# Patient Record
Sex: Female | Born: 1965 | Race: White | Hispanic: No | Marital: Married | Smoking: Former smoker
Health system: Southern US, Community
[De-identification: ages and names within clinical notes are randomized; demographics above are authoritative.]

## PROBLEM LIST (undated history)

## (undated) DIAGNOSIS — G4730 Sleep apnea, unspecified: Secondary | ICD-10-CM

## (undated) DIAGNOSIS — M069 Rheumatoid arthritis, unspecified: Secondary | ICD-10-CM

## (undated) DIAGNOSIS — E785 Hyperlipidemia, unspecified: Secondary | ICD-10-CM

## (undated) DIAGNOSIS — E039 Hypothyroidism, unspecified: Secondary | ICD-10-CM

## (undated) DIAGNOSIS — I10 Essential (primary) hypertension: Secondary | ICD-10-CM

## (undated) HISTORY — DX: Sleep apnea, unspecified: G47.30

## (undated) HISTORY — DX: Hyperlipidemia, unspecified: E78.5

## (undated) HISTORY — DX: Rheumatoid arthritis, unspecified: M06.9

## (undated) HISTORY — DX: Essential (primary) hypertension: I10

## (undated) HISTORY — DX: Hypothyroidism, unspecified: E03.9

## (undated) HISTORY — PX: TONSILLECTOMY: SHX5217

## (undated) HISTORY — PX: WISDOM TOOTH EXTRACTION: SHX21

---
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
## 1999-06-11 DIAGNOSIS — E039 Hypothyroidism, unspecified: Secondary | ICD-10-CM

## 1999-06-11 HISTORY — DX: Hypothyroidism, unspecified: E03.9

## 2006-04-10 DIAGNOSIS — M069 Rheumatoid arthritis, unspecified: Secondary | ICD-10-CM

## 2006-04-10 HISTORY — DX: Rheumatoid arthritis, unspecified: M06.9

## 2006-06-18 ENCOUNTER — Encounter: Admission: RE | Admit: 2006-06-18 | Discharge: 2006-06-18 | Payer: Self-pay | Admitting: Rheumatology

## 2007-03-18 ENCOUNTER — Emergency Department (HOSPITAL_COMMUNITY): Admission: EM | Admit: 2007-03-18 | Discharge: 2007-03-18 | Payer: Self-pay | Admitting: Family Medicine

## 2012-05-25 ENCOUNTER — Other Ambulatory Visit: Payer: Self-pay | Admitting: Family Medicine

## 2012-05-25 ENCOUNTER — Encounter: Payer: Self-pay | Admitting: Family Medicine

## 2012-05-25 ENCOUNTER — Ambulatory Visit (INDEPENDENT_AMBULATORY_CARE_PROVIDER_SITE_OTHER): Payer: BC Managed Care – PPO | Admitting: Family Medicine

## 2012-05-25 VITALS — BP 122/76 | HR 68 | Ht 68.0 in | Wt 248.0 lb

## 2012-05-25 DIAGNOSIS — M069 Rheumatoid arthritis, unspecified: Secondary | ICD-10-CM

## 2012-05-25 DIAGNOSIS — E039 Hypothyroidism, unspecified: Secondary | ICD-10-CM

## 2012-05-25 DIAGNOSIS — M0579 Rheumatoid arthritis with rheumatoid factor of multiple sites without organ or systems involvement: Secondary | ICD-10-CM | POA: Insufficient documentation

## 2012-05-25 DIAGNOSIS — E669 Obesity, unspecified: Secondary | ICD-10-CM | POA: Insufficient documentation

## 2012-05-25 DIAGNOSIS — Z79899 Other long term (current) drug therapy: Secondary | ICD-10-CM

## 2012-05-25 DIAGNOSIS — I10 Essential (primary) hypertension: Secondary | ICD-10-CM

## 2012-05-25 DIAGNOSIS — Z833 Family history of diabetes mellitus: Secondary | ICD-10-CM

## 2012-05-25 LAB — POCT GLYCOSYLATED HEMOGLOBIN (HGB A1C): Hemoglobin A1C: 5.4

## 2012-05-25 LAB — BASIC METABOLIC PANEL
CO2: 30 mEq/L (ref 19–32)
Creat: 0.85 mg/dL (ref 0.50–1.10)
Sodium: 142 mEq/L (ref 135–145)

## 2012-05-25 LAB — TSH: TSH: 2.735 u[IU]/mL (ref 0.350–4.500)

## 2012-05-25 MED ORDER — BENAZEPRIL HCL 10 MG PO TABS: 10.0000 mg | ORAL_TABLET | Freq: Every day | ORAL | Status: DC

## 2012-05-25 MED ORDER — HYDROCHLOROTHIAZIDE 25 MG PO TABS: 25.0000 mg | ORAL_TABLET | Freq: Every day | ORAL | Status: DC

## 2012-05-25 NOTE — Progress Notes (Signed)
Chief Complaint  Patient presents with  . Establish Care    new patient to establish care, needs 3 rx's. Would like to have a nutrition consult.    Patient presents to establish care. She is under the care of Dr. Corliss Skains for her RA, and has labs done q3 months with her (CBC, LFT's, Cr).  Hasn't had TSH done in over 2 years, no had sugar checked, or lipids.  Her chief concern is that she never feels like her energy is as good as it was in the past.  She used to be able to swim a mile 3x/week, and when able to do this, she expected to feel better, more energy.  Wants to feel like she did when she was younger, or when she returned from a trip to [place], where energy felt better. Periods every 3-4 months, very light.  +hot flashes.  Denies h/o anemia.  +weight gain, 20 pounds in the last 6 months.  Less active due to arthritis flares.  Doing well currently.  Walks 1 mile daily with dogs, hikes 3-5 miles on the weekends.   Just rejoined the pool.  Has a big back-packing trip in June.  She is interested in learning how to eat better, healthier, and would like nutrition consult.  HTN--BP's at home 120's/70's.  Denies headaches, dizziness, chest pain.  Occasionally has pretibial swelling (sock marks, mild).  Health maintenance: never had a mammogram or colonoscopy.  Doesn't recall last tetanus.   Immunization History  Administered Date(s) Administered  . Influenza Split 03/10/2012  . Pneumococcal Polysaccharide 07/02/2006  believes she has had normal lipids in past (recalls that ratio was okay)   Past Medical History  Diagnosis Date  . Rheumatoid arthritis 04/2006    Dr. Corliss Skains manages  . Hypothyroidism 2001  . Hypertension    Past Surgical History  Procedure Date  . Pilonidal cyst excision 1983  . Tonsillectomy age 40  . Wisdom tooth extraction    History   Social History  . Marital Status: Unknown    Spouse Name: N/A    Number of Children: N/A  . Years of Education: N/A    Occupational History  . teaches biology/ecology at [employer]    Social History Main Topics  . Smoking status: Former Smoker -- 1.5 packs/day for 26 years    Types: Cigarettes    Start date: 01/08/2005  . Smokeless tobacco: Never Used  . Alcohol Use: Yes     Comment: 1-2 drinks (beer) per week.  . Drug Use: No  . Sexually Active: Yes -- Female partner(s)   Other Topics Concern  . Not on file   Social History Narrative   Lives with female partner of 20 years. 3 cats, 2 dogs, 14 chickens.   Family History  Problem Relation Age of Onset  . Heart disease Mother 69    MI, stents x 2  . Stroke Mother 62  . Hyperlipidemia Mother   . Hypertension Mother   . Depression Mother   . Diabetes Mother   . Thyroid disease Mother     Graves disease, s/p RAI, now hypothyroid  . Irritable bowel syndrome Mother   . COPD Mother   . Colon polyps Mother   . Heart disease Father 39    MI; s/p CABG  . Stroke Father 71  . Hyperlipidemia Father   . Hypertension Father   . Depression Father   . Diabetes Father   . Thyroid disease Father  hypothyroidism  . COPD Father   . Cancer Sister 58    pancreatic cancer  . Bipolar disorder Sister   . Lupus Paternal Aunt    Current outpatient prescriptions:aspirin 81 MG tablet, Take 81 mg by mouth daily., Disp: , Rfl: ;  benazepril (LOTENSIN) 10 MG tablet, Take 1 tablet (10 mg total) by mouth daily., Disp: 90 tablet, Rfl: 1;  ENBREL SURECLICK 50 MG/ML injection, 50 mg once a week. , Disp: , Rfl: ;  FABB 2.2-25-1 MG TABS, 1 tablet daily. , Disp: , Rfl: ;  fish oil-omega-3 fatty acids 1000 MG capsule, Take 1 g by mouth daily., Disp: , Rfl:  hydrochlorothiazide (HYDRODIURIL) 25 MG tablet, Take 1 tablet (25 mg total) by mouth daily., Disp: 90 tablet, Rfl: 1;  methotrexate 25 MG/ML injection, Inject 25 mg/m2 into the vein once., Disp: , Rfl: ;  Nutritional Supplements (EQ ESTROBLEND MENOPAUSE PO), Take 1 tablet by mouth daily., Disp: , Rfl:  ;  SYNTHROID 137 MCG tablet, Take 137 mcg by mouth daily. , Disp: , Rfl:  traMADol (ULTRAM) 50 MG tablet, Take 50 mg by mouth as needed., Disp: , Rfl:   No Known Allergies  ROS: Hoarseness--has seen ENT and told related to RA.  X 2 years.  Resolves with steroids.  Denies fevers, headaches, dizziness, chest pain, shortness of breath, cough, URI symptoms, nausea, vomiting, heartburn, bowel changes, skin rash, bleeding/bruising, depression/anxiety. +irregular menses, light.  No current joint complaints/flares.  See HPI  PHYSICAL EXAM: BP 122/76  Pulse 68  Ht 5\' 8"  (1.727 m)  Wt 248 lb (112.492 kg)  BMI 37.71 kg/m2  LMP 02/21/2012 Well developed, pleasant female in no distress HEENT:  PERRL, EOMI, conjunctiva clear.  OP clear Neck: no lymphadenopathy, thyromegaly or carotid bruit Heart: regular rate and rhythm without murmur Lungs: clear bilaterally Back: no spine or CVA tenderness Abdomen: soft, nontender, no organomegaly or mass Extremities: no edema Skin: no rash Psych: normal mood, affect, hygiene and grooming Neuro: alert and oriented, cranial nerves intact. Normal strength, gait  ASSESSMENT/PLAN:  1. Unspecified hypothyroidism  TSH  2. Essential hypertension, benign  Basic metabolic panel, benazepril (LOTENSIN) 10 MG tablet, hydrochlorothiazide (HYDRODIURIL) 25 MG tablet  3. Encounter for long-term (current) use of other medications  Basic metabolic panel  4. Family history of diabetes mellitus  HgB A1c  5. Rheumatoid arthritis    6. Obesity (BMI 30-39.9)     HTN--well controlled.  Need to see last lipids, likely will need to be rechecked (but will make sure TSH normal first, since nonfasting today). Check b-met (hasn't had sodium/potassium checked in years).  Will need lipid and glucose when fasting, but will confirm that thyroid dose is correct first.  Check A1c today, given obesity, strong family h/o DM. Will review old records, when received, for last  lipids.  Hypothyroidism--discussed at length reason for brand vs generic (some cost concerns on her part), willing to continue brand for now.  Having some thyroid symptoms--await lab results before refilling. (if TSH normal, refill 90 day supply x 3 refills, if dose needs adjustment, just #30 x 1 refill and recheck labs).  Obesity--await lab results for nutrition referral.  HTN, family h/o heart disease and DM, obesity.  Health  Maintenance: encouraged to get yearly mammograms, return for CPE.  Will likely need TdaP at CPE, and pneumovax booster.  ADD VITAMIN D if TSH normal, for evaluation of fatigue, otherwise, can check when she gets lipids done, or with next labs.   45  minute visit, more than 1/2 spent counseling (re: meds, diet, HM recommendations, and why she should follow them)

## 2012-05-25 NOTE — Patient Instructions (Addendum)
Sign release to get previous records (immunizations).  We will be in contact with your test results within a few days. We will refer you to a nutritionist once labs back.  You likely will need lipids checked at some point, but I want to make sure to check them when your thyroid dose is known to be correct (and you need to be fasting, so not done today).  If your mother had adenomatous colon polyps, then colonoscopy is recommended now (not waiting until age 72). Yearly mammograms are recommended. TdaP (tetanus and pertussis) is recommended, unless you have already had one since 2005. Pneumonia vaccines are recommended every 5-7 years

## 2012-05-26 ENCOUNTER — Encounter: Payer: Self-pay | Admitting: Family Medicine

## 2012-05-26 LAB — VITAMIN D 25 HYDROXY (VIT D DEFICIENCY, FRACTURES): Vit D, 25-Hydroxy: 36 ng/mL (ref 30–89)

## 2012-05-26 MED ORDER — SYNTHROID 137 MCG PO TABS: 137.0000 ug | ORAL_TABLET | Freq: Every day | ORAL | Status: DC

## 2012-06-18 ENCOUNTER — Encounter: Payer: Self-pay | Admitting: *Deleted

## 2012-09-07 ENCOUNTER — Telehealth: Payer: Self-pay | Admitting: *Deleted

## 2012-09-07 ENCOUNTER — Other Ambulatory Visit: Payer: BC Managed Care – PPO

## 2012-09-07 DIAGNOSIS — Z13220 Encounter for screening for lipoid disorders: Secondary | ICD-10-CM

## 2012-09-07 DIAGNOSIS — Z833 Family history of diabetes mellitus: Secondary | ICD-10-CM

## 2012-09-07 LAB — LIPID PANEL
HDL: 48 mg/dL (ref >39)
Triglycerides: 106 mg/dL (ref <150)
VLDL: 21 mg/dL (ref 0–40)

## 2012-09-07 LAB — GLUCOSE, RANDOM: Glucose, Bld: 91 mg/dL (ref 70–99)

## 2012-09-07 NOTE — Telephone Encounter (Signed)
Patient came in for pre-CPE labs, need orders please. Thanks.

## 2012-09-07 NOTE — Telephone Encounter (Signed)
Just needs lipids and glucose (v77.91 for screen for lipid d/o--other records never received, so not known if she has had high cholesterol in past), and screen for diabetes code for glucose (and/or family h/o DM)

## 2012-09-08 ENCOUNTER — Encounter: Payer: Self-pay | Admitting: Internal Medicine

## 2012-09-09 ENCOUNTER — Ambulatory Visit (INDEPENDENT_AMBULATORY_CARE_PROVIDER_SITE_OTHER): Payer: BC Managed Care – PPO | Admitting: Family Medicine

## 2012-09-09 ENCOUNTER — Encounter: Payer: Self-pay | Admitting: Family Medicine

## 2012-09-09 VITALS — BP 120/84 | HR 72 | Ht 68.0 in | Wt 247.0 lb

## 2012-09-09 DIAGNOSIS — E785 Hyperlipidemia, unspecified: Secondary | ICD-10-CM

## 2012-09-09 DIAGNOSIS — Z23 Encounter for immunization: Secondary | ICD-10-CM

## 2012-09-09 DIAGNOSIS — E669 Obesity, unspecified: Secondary | ICD-10-CM

## 2012-09-09 DIAGNOSIS — I10 Essential (primary) hypertension: Secondary | ICD-10-CM

## 2012-09-09 DIAGNOSIS — E039 Hypothyroidism, unspecified: Secondary | ICD-10-CM

## 2012-09-09 DIAGNOSIS — M069 Rheumatoid arthritis, unspecified: Secondary | ICD-10-CM

## 2012-09-09 DIAGNOSIS — E782 Mixed hyperlipidemia: Secondary | ICD-10-CM | POA: Insufficient documentation

## 2012-09-09 DIAGNOSIS — Z0000 Encounter for general adult medical examination without abnormal findings: Secondary | ICD-10-CM

## 2012-09-09 LAB — POCT URINALYSIS DIPSTICK
Bilirubin, UA: NEGATIVE
Glucose, UA: NEGATIVE
Nitrite, UA: NEGATIVE
Urobilinogen, UA: NEGATIVE

## 2012-09-09 NOTE — Patient Instructions (Signed)
HEALTH MAINTENANCE RECOMMENDATIONS:  It is recommended that you get at least 30 minutes of aerobic exercise at least 5 days/week (for weight loss, you may need as much as 60-90 minutes). This can be any activity that gets your heart rate up. This can be divided in 10-15 minute intervals if needed, but try and build up your endurance at least once a week.  Weight bearing exercise is also recommended twice weekly.  Eat a healthy diet with lots of vegetables, fruits and fiber.  "Colorful" foods have a lot of vitamins (ie green vegetables, tomatoes, red peppers, etc).  Limit sweet tea, regular sodas and alcoholic beverages, all of which has a lot of calories and sugar.  Up to 1 alcoholic drink daily may be beneficial for women (unless trying to lose weight, watch sugars).  Drink a lot of water.  Calcium recommendations are 1200-1500 mg daily (1500 mg for postmenopausal women or women without ovaries), and vitamin D 1000 IU daily.  This should be obtained from diet and/or supplements (vitamins), and calcium should not be taken all at once, but in divided doses.  Monthly self breast exams and yearly mammograms for women over the age of 44 is recommended.  Sunscreen of at least SPF 30 should be used on all sun-exposed parts of the skin when outside between the hours of 10 am and 4 pm (not just when at beach or pool, but even with exercise, golf, tennis, and yard work!)  Use a sunscreen that says "broad spectrum" so it covers both UVA and UVB rays, and make sure to reapply every 1-2 hours.  Remember to change the batteries in your smoke detectors when changing your clock times in the spring and fall.  Use your seat belt every time you are in a car, and please drive safely and not be distracted with cell phones and texting while driving.  I recommend Dr. Carrington Clamp at [facility] for your routine GYN care.  Please call to schedule your breast/pelvic exam.  You will need a pap smear

## 2012-09-09 NOTE — Progress Notes (Signed)
Chief Complaint  Patient presents with  . Annual Exam    nonfasting annual exam no pap. UA showed trace leuks, no symptoms. Would like discuss HRT.   Jill Mathews is a 47 y.o. female who presents for a complete physical.  She has the following concerns:  Periods are irregular, every 4-5 months, very short.  Having mild hot flashes, OTC meds have been helpful. Asking about HRT, discussion of benefits, worried about osteoporosis.  HTN--BP's 120's/70's-80's at home.  +stress at work, teaching an extra class. Hypothyroidism--compliant with meds.  Denies hair/skin/weight changes  HEALTH MAINTENANCE: Immunization History  Administered Date(s) Administered  . Influenza Split 03/10/2012  . Pneumococcal Polysaccharide 07/02/2006  . Tdap 09/09/2012  last tetanus >10 years ago Last Pap smear:  Age 35--had a very bad experience and refuses--will only see a lesbian GYN; declines exam today Last mammogram: never Last colonoscopy: never Last DEXA:never Dentist: every 6 months Ophtho: yearly (due to meds) Exercise:  1 mile daily (with the dogs), walks 5-10 miles on the weekends.    Past Medical History  Diagnosis Date  . Rheumatoid arthritis 04/2006    Dr. Corliss Skains manages  . Hypothyroidism 2001  . Hypertension     Past Surgical History  Procedure Laterality Date  . Pilonidal cyst excision  1983  . Tonsillectomy  age 10  . Wisdom tooth extraction      History   Social History  . Marital Status: Unknown    Spouse Name: N/A    Number of Children: N/A  . Years of Education: N/A   Occupational History  . teaches biology/ecology at [employer]    Social History Main Topics  . Smoking status: Former Smoker -- 1.50 packs/day for 26 years    Types: Cigarettes    Start date: 01/08/2005  . Smokeless tobacco: Never Used  . Alcohol Use: Yes     Comment: 1-2 drinks (beer) per week.  . Drug Use: No  . Sexually Active: Yes -- Female partner(s)   Other Topics Concern   . Not on file   Social History Narrative   Lives with female partner of 20 years. 3 cats, 2 dogs, 14 chickens.    Family History  Problem Relation Age of Onset  . Heart disease Mother 50    MI, stents x 2  . Stroke Mother 33  . Hyperlipidemia Mother   . Hypertension Mother   . Depression Mother   . Diabetes Mother   . Thyroid disease Mother     Graves disease, s/p RAI, now hypothyroid  . Irritable bowel syndrome Mother   . COPD Mother   . Colon polyps Mother   . Cancer Mother     skin (not melanoma)  . Heart disease Father 64    MI; s/p CABG  . Stroke Father 62  . Hyperlipidemia Father   . Hypertension Father   . Depression Father   . Diabetes Father   . Thyroid disease Father     hypothyroidism  . COPD Father   . Cancer Sister 78    pancreatic cancer  . Bipolar disorder Sister   . Lupus Paternal Aunt     Current outpatient prescriptions:aspirin 81 MG tablet, Take 81 mg by mouth daily., Disp: , Rfl: ;  benazepril (LOTENSIN) 10 MG tablet, Take 1 tablet (10 mg total) by mouth daily., Disp: 90 tablet, Rfl: 1;  ENBREL SURECLICK 50 MG/ML injection, 50 mg once a week. , Disp: , Rfl: ;  FABB 2.2-25-1 MG  TABS, 1 tablet daily. , Disp: , Rfl: ;  fish oil-omega-3 fatty acids 1000 MG capsule, Take 1 g by mouth daily., Disp: , Rfl:  hydrochlorothiazide (HYDRODIURIL) 25 MG tablet, Take 1 tablet (25 mg total) by mouth daily., Disp: 90 tablet, Rfl: 1;  ibuprofen (ADVIL,MOTRIN) 200 MG tablet, Take 600 mg by mouth 2 (two) times daily., Disp: , Rfl: ;  methotrexate 25 MG/ML injection, Inject 25 mg/m2 into the vein once., Disp: , Rfl: ;  Nutritional Supplements (EQ ESTROBLEND MENOPAUSE PO), Take 1 tablet by mouth daily., Disp: , Rfl:  SYNTHROID 137 MCG tablet, Take 1 tablet (137 mcg total) by mouth daily., Disp: 90 tablet, Rfl: 3;  traMADol (ULTRAM) 50 MG tablet, Take 50 mg by mouth as needed., Disp: , Rfl:   No Known Allergies  ROS:  The patient denies anorexia, fever, weight changes,  headaches,  vision changes, decreased hearing, ear pain, sore throat, breast concerns, chest pain, palpitations, dizziness, syncope, dyspnea on exertion, cough, swelling, nausea, vomiting, diarrhea, constipation, abdominal pain, melena, hematochezia, indigestion/heartburn, hematuria, incontinence, dysuria,  vaginal discharge, odor or itch, genital lesions, numbness, tingling, weakness, tremor, suspicious skin lesions, depression, anxiety, abnormal bleeding/bruising, or enlarged lymph nodes.  +irregular menstrual cycles. Mild hot flashes Pain at wrists, elbows, shoulders, knees Hoarseness--chronic, has seen ENT, inflammation of vocal cords, felt to be related to her RA   PHYSICAL EXAM: BP 120/84  Pulse 72  Ht 5\' 8"  (1.727 m)  Wt 247 lb (112.038 kg)  BMI 37.56 kg/m2  LMP 07/22/2012 120/84 on repeat by MD General Appearance:    Alert, cooperative, no distress, appears stated age  Head:    Normocephalic, without obvious abnormality, atraumatic  Eyes:    PERRL, conjunctiva/corneas clear, EOM's intact, fundi    benign  Ears:    Normal TM's and external ear canals  Nose:   Nares normal, mucosa normal, no drainage or sinus   tenderness  Throat:   Lips, mucosa, and tongue normal; teeth and gums normal  Neck:   Supple, no lymphadenopathy;  thyroid:  no   enlargement/tenderness/nodules; no carotid   bruit or JVD  Back:    Spine nontender, no curvature, ROM normal, no CVA     tenderness  Lungs:     Clear to auscultation bilaterally without wheezes, rales or     ronchi; respirations unlabored  Chest Wall:    No tenderness or deformity   Heart:    Regular rate and rhythm, S1 and S2 normal, no murmur, rub   or gallop  Breast Exam:    Deferred to GYN  Abdomen:     Soft, non-tender, nondistended, normoactive bowel sounds,    no masses, no hepatosplenomegaly  Genitalia:    Deferred to GYN     Extremities:   No clubbing, cyanosis or edema  Pulses:   2+ and symmetric all extremities  Skin:   Skin  color, texture, turgor normal, no rashes or lesions  Lymph nodes:   Cervical, supraclavicular, and axillary nodes normal  Neurologic:   CNII-XII intact, normal strength, sensation and gait; reflexes 2+ and symmetric throughout          Psych:   Normal mood, affect, hygiene and grooming.    ASSESSMENT/PLAN: Routine general medical examination at a health care facility - Plan: Visual acuity screening, POCT Urinalysis Dipstick  Essential hypertension, benign - controlled - Plan: Lipid panel, Comprehensive metabolic panel  Obesity (BMI 30-39.9)  Need for Tdap vaccination - Plan: Tdap vaccine greater than or equal  to 7yo IM  Other and unspecified hyperlipidemia - mild.  refer to nutritionist  Rheumatoid arthritis - per Dr. Corliss Skains  Unspecified hypothyroidism - adequately replaced per last labs. euthyroid by history - Plan: TSH   Refer to [facility] (hyperlipidemia, HTN and weight loss)--if not covered, then refer to [facility]  Reviewed low cholesterol diet--cut back on egg yolks and bacon.  Discussed GYN recommendations and for her to schedule--reasons for need for pelvic exam (low risk for cervical cancer, but needs pelvic, and should have pap too) and breast exam.  Perimenopausal--continue with OTC herbal treatments.  Given that she isn't postmenopausal yet, isn't candidate for HRT.  Discussed symptomatic treatment with hormones once postmenopausal, but would need yearly mammograms.  May continue to use herbal supplements for now.  Advised to schedule yearly mammograms  Colonoscopy at 50, sooner if blood in stool.  Hemoccult given  Discussed monthly self breast exams and yearly mammograms after the age of 5; at least 30 minutes of aerobic activity at least 5 days/week; proper sunscreen use reviewed; healthy diet, including goals of calcium and vitamin D intake and alcohol recommendations (less than or equal to 1 drink/day) reviewed; regular seatbelt use; changing batteries in smoke  detectors.  Immunization recommendations discussed--Tdap today.  Colonoscopy recommendations reviewed--age 47, sooner if +hemoccult   Will refill meds when pharm calls in 3 months

## 2012-12-14 ENCOUNTER — Other Ambulatory Visit: Payer: Self-pay | Admitting: Family Medicine

## 2013-03-08 ENCOUNTER — Other Ambulatory Visit: Payer: BC Managed Care – PPO

## 2013-03-15 ENCOUNTER — Encounter: Payer: BC Managed Care – PPO | Admitting: Family Medicine

## 2013-04-15 ENCOUNTER — Other Ambulatory Visit: Payer: Self-pay

## 2013-04-20 ENCOUNTER — Other Ambulatory Visit: Payer: Self-pay | Admitting: Family Medicine

## 2013-05-15 ENCOUNTER — Encounter (HOSPITAL_COMMUNITY): Payer: Self-pay | Admitting: Emergency Medicine

## 2013-05-15 ENCOUNTER — Emergency Department (HOSPITAL_COMMUNITY)
Admission: EM | Admit: 2013-05-15 | Discharge: 2013-05-15 | Disposition: A | Discharge status: Home / Self Care | Payer: BC Managed Care – PPO | Attending: Emergency Medicine | Admitting: Emergency Medicine

## 2013-05-15 DIAGNOSIS — Z203 Contact with and (suspected) exposure to rabies: Secondary | ICD-10-CM | POA: Insufficient documentation

## 2013-05-15 DIAGNOSIS — I10 Essential (primary) hypertension: Secondary | ICD-10-CM | POA: Insufficient documentation

## 2013-05-15 DIAGNOSIS — Z862 Personal history of diseases of the blood and blood-forming organs and certain disorders involving the immune mechanism: Secondary | ICD-10-CM | POA: Insufficient documentation

## 2013-05-15 DIAGNOSIS — Z7982 Long term (current) use of aspirin: Secondary | ICD-10-CM | POA: Insufficient documentation

## 2013-05-15 DIAGNOSIS — M069 Rheumatoid arthritis, unspecified: Secondary | ICD-10-CM | POA: Insufficient documentation

## 2013-05-15 DIAGNOSIS — Z87891 Personal history of nicotine dependence: Secondary | ICD-10-CM | POA: Insufficient documentation

## 2013-05-15 DIAGNOSIS — Z8639 Personal history of other endocrine, nutritional and metabolic disease: Secondary | ICD-10-CM | POA: Insufficient documentation

## 2013-05-15 DIAGNOSIS — Z79899 Other long term (current) drug therapy: Secondary | ICD-10-CM | POA: Insufficient documentation

## 2013-05-15 NOTE — ED Notes (Signed)
Patient complaint of exposure to rabies.  Patient states that their dogs killed a fox that tested positive for rabies.  Patient states she put on latex gloves and put the fox in a bag and tied the bag.  Advised by the Health Department to be evaluated.  Exposure was on 05/06/2013.

## 2013-05-15 NOTE — ED Provider Notes (Signed)
CSN: [id number]     Arrival date & time 05/15/13  4098 History  This chart was scribed for Antony Madura, PA-C working with Doug Sou, MD by Nicholos Johns, ED scribe. This patient was seen in room TR07C/TR07C and the patient's care was started at 10:02.   Chief Complaint  Patient presents with  . Rabies Injection   The history is provided by the patient. No language interpreter was used.   HPI Comments: Jill Mathews is a 47 y.o. female who presents to the Emergency Department for possible rabies exporsure after dogs that came in contact with, and killed, a fox that tested positive for rabies. Incident occurred on 05/06/13. Pt stated she also handled the fox with rubber gloves but only touched "the tip of the tail." Pt states one of the dogs is up to date on its rabies shots and the other dog that was not up to date so it was euthanized. Denies blood to blood contact or open wounds. Denies being bitten by either the fox or one of her dogs. Dogs with normal temperament. Denies fever, headache, hallucinations, nausea, vomiting, body aches, or increased thirst.  Past Medical History  Diagnosis Date  . Rheumatoid arthritis(714.0) 04/2006    Dr. Corliss Skains manages  . Hypothyroidism 2001  . Hypertension    Past Surgical History  Procedure Laterality Date  . Pilonidal cyst excision  1983  . Tonsillectomy  age 68  . Wisdom tooth extraction     Family History  Problem Relation Age of Onset  . Heart disease Mother 43    MI, stents x 2  . Stroke Mother 55  . Hyperlipidemia Mother   . Hypertension Mother   . Depression Mother   . Diabetes Mother   . Thyroid disease Mother     Graves disease, s/p RAI, now hypothyroid  . Irritable bowel syndrome Mother   . COPD Mother   . Colon polyps Mother   . Cancer Mother     skin (not melanoma)  . Heart disease Father 23    MI; s/p CABG  . Stroke Father 42  . Hyperlipidemia Father   . Hypertension Father   . Depression Father   . Diabetes  Father   . Thyroid disease Father     hypothyroidism  . COPD Father   . Cancer Sister 58    pancreatic cancer  . Bipolar disorder Sister   . Lupus Paternal Aunt    History  Substance Use Topics  . Smoking status: Former Smoker -- 1.50 packs/day for 26 years    Types: Cigarettes    Start date: 01/08/2005  . Smokeless tobacco: Never Used  . Alcohol Use: Yes     Comment: 1-2 drinks (beer) per week.   OB History   Grav Para Term Preterm Abortions TAB SAB Ect Mult Living                 Review of Systems  Constitutional: Negative for fever and fatigue.  Respiratory: Negative for shortness of breath.   Gastrointestinal: Negative for nausea and vomiting.  Skin:       No bites or scratches  Neurological: Negative for weakness and headaches.  Psychiatric/Behavioral: Negative for hallucinations.    Allergies  Review of patient's allergies indicates no known allergies.  Home Medications   Current Outpatient Rx  Name  Route  Sig  Dispense  Refill  . aspirin 81 MG tablet   Oral   Take 81 mg by mouth daily.         Marland Kitchen  benazepril (LOTENSIN) 10 MG tablet   Oral   Take 10 mg by mouth daily.         Marland Kitchen BLACK COHOSH PO   Oral   Take 2 tablets by mouth daily.         Elgie Collard SURECLICK 50 MG/ML injection   Injection   Inject 50 mg as directed once a week.          Marland Kitchen FABB 2.2-25-1 MG TABS   Oral   Take 1 tablet by mouth daily.          . fish oil-omega-3 fatty acids 1000 MG capsule   Oral   Take 1 g by mouth daily.         . hydrochlorothiazide (HYDRODIURIL) 25 MG tablet   Oral   Take 25 mg by mouth daily.         . methotrexate 25 MG/ML injection   Intravenous   Inject 25 mg/m2 into the vein once.         . Nutritional Supplements (EQ ESTROBLEND MENOPAUSE PO)   Oral   Take 1 tablet by mouth daily.         Marland Kitchen SYNTHROID 137 MCG tablet   Oral   Take 1 tablet (137 mcg total) by mouth daily.   90 tablet   3     Dispense as written.    Triage  Vitals: BP 132/73  Temp(Src) 97 F (36.1 C) (Oral)  Resp 20  Ht 5\' 7"  (1.702 m)  Wt 245 lb (111.131 kg)  BMI 38.36 kg/m2  SpO2 98%  LMP 01/13/2013  Physical Exam  Nursing note and vitals reviewed. Constitutional: She is oriented to person, place, and time. She appears well-developed and well-nourished.  HENT:  Head: Normocephalic and atraumatic.  Mouth/Throat: Oropharynx is clear and moist.  Eyes: Conjunctivae and EOM are normal. Pupils are equal, round, and reactive to light.  Neck: Normal range of motion. Neck supple.  Cardiovascular: Normal rate, regular rhythm and normal heart sounds.   Pulmonary/Chest: Effort normal and breath sounds normal. No respiratory distress. She has no wheezes. She has no rales.  Abdominal: Soft. There is no tenderness. There is no rebound and no guarding.  Musculoskeletal: Normal range of motion. She exhibits no tenderness.  Neurological: She is alert and oriented to person, place, and time. She has normal reflexes.  Skin: Skin is warm and dry.  Psychiatric: She has a normal mood and affect. Her behavior is normal.    ED Course  Procedures (including critical care time) DIAGNOSTIC STUDIES: Oxygen Saturation is 98% on room air, normal by my interpretation.    COORDINATION OF CARE: At 10:07, pt was advised she would not need a rabies shot and to follow up with PCP if symptoms arise. Patient agrees.   Labs Review Labs Reviewed - No data to display Imaging Review No results found.  EKG Interpretation   None       MDM   1. Rabies contact    47 year old female presents to the emergency department today after 2 of her dogs killed a fox in their dog pen which later tested positive for rabies. Patient denies being bitten or scratched by the fox or one of her dogs. Dogs with normal temperament. Patient well and nontoxic appearing, hemodynamically stable, and afebrile. As patient without any contact to perpetuate rabies infection, do not believe  vaccination warranted today; this is c/w current guidelines regarding for rabies vaccination and exposure. PCP follow up advised  as needed. Return precautions discussed and patient agreeable to plan with no unaddressed concerns.  I personally performed the services described in this documentation, which was scribed in my presence. The recorded information has been reviewed and is accurate.       Antony Madura, PA-C 05/15/13 1635

## 2013-05-15 NOTE — ED Provider Notes (Signed)
Medical screening examination/treatment/procedure(s) were performed by non-physician practitioner and as supervising physician I was immediately available for consultation/collaboration.  EKG Interpretation   None        Doug Sou, MD 05/15/13 1646

## 2013-05-18 ENCOUNTER — Other Ambulatory Visit: Payer: Self-pay | Admitting: Family Medicine

## 2013-07-08 ENCOUNTER — Other Ambulatory Visit: Payer: Self-pay | Admitting: *Deleted

## 2013-07-08 ENCOUNTER — Telehealth: Payer: Self-pay | Admitting: Family Medicine

## 2013-07-08 ENCOUNTER — Telehealth: Payer: Self-pay | Admitting: *Deleted

## 2013-07-08 ENCOUNTER — Other Ambulatory Visit: Payer: BC Managed Care – PPO

## 2013-07-08 DIAGNOSIS — E039 Hypothyroidism, unspecified: Secondary | ICD-10-CM

## 2013-07-08 DIAGNOSIS — I10 Essential (primary) hypertension: Secondary | ICD-10-CM

## 2013-07-08 LAB — LIPID PANEL
CHOL/HDL RATIO: 4 ratio
Cholesterol: 202 mg/dL — ABNORMAL HIGH (ref 0–200)
HDL: 51 mg/dL (ref >39)
LDL Cholesterol: 134 mg/dL — ABNORMAL HIGH (ref 0–99)
Triglycerides: 84 mg/dL (ref <150)
VLDL: 17 mg/dL (ref 0–40)

## 2013-07-08 LAB — COMPREHENSIVE METABOLIC PANEL
ALK PHOS: 52 U/L (ref 39–117)
ALT: 42 U/L — AB (ref 0–35)
AST: 23 U/L (ref 0–37)
Albumin: 4 g/dL (ref 3.5–5.2)
BUN: 15 mg/dL (ref 6–23)
CALCIUM: 9.1 mg/dL (ref 8.4–10.5)
CHLORIDE: 104 meq/L (ref 96–112)
CO2: 27 meq/L (ref 19–32)
CREATININE: 0.73 mg/dL (ref 0.50–1.10)
Glucose, Bld: 93 mg/dL (ref 70–99)
POTASSIUM: 4.3 meq/L (ref 3.5–5.3)
Sodium: 138 mEq/L (ref 135–145)
Total Bilirubin: 0.6 mg/dL (ref 0.2–1.2)
Total Protein: 6.2 g/dL (ref 6.0–8.3)

## 2013-07-08 LAB — TSH: TSH: 2.824 u[IU]/mL (ref 0.350–4.500)

## 2013-07-08 MED ORDER — BENAZEPRIL HCL 10 MG PO TABS: 10.0000 mg | ORAL_TABLET | Freq: Every day | ORAL | Status: DC

## 2013-07-08 NOTE — Telephone Encounter (Signed)
Called patient to let her know that bp med was sent to pharmacy and that tomorrow after you receive TSH you will send 30 days worth of Synthroid to [facility]. (thought you might want this back to keep in your phone call for tomorrow)

## 2013-07-08 NOTE — Telephone Encounter (Signed)
Ok for #30 only on benazepril.  Wait until tomorrow for lab results before the synthroid is refilled

## 2013-07-09 ENCOUNTER — Other Ambulatory Visit: Payer: Self-pay | Admitting: Family Medicine

## 2013-07-09 MED ORDER — SYNTHROID 137 MCG PO TABS: 137.0000 ug | ORAL_TABLET | Freq: Every day | ORAL | Status: DC

## 2013-07-09 NOTE — Telephone Encounter (Signed)
done

## 2013-07-09 NOTE — Addendum Note (Signed)
Addended by: Joselyn Arrow on: 07/09/2013 07:47 AM   Modules accepted: Orders

## 2013-07-28 ENCOUNTER — Ambulatory Visit (INDEPENDENT_AMBULATORY_CARE_PROVIDER_SITE_OTHER): Payer: BC Managed Care – PPO | Admitting: Family Medicine

## 2013-07-28 ENCOUNTER — Encounter: Payer: Self-pay | Admitting: Family Medicine

## 2013-07-28 VITALS — BP 130/80 | HR 64 | Ht 68.0 in | Wt 248.0 lb

## 2013-07-28 DIAGNOSIS — E039 Hypothyroidism, unspecified: Secondary | ICD-10-CM

## 2013-07-28 DIAGNOSIS — I10 Essential (primary) hypertension: Secondary | ICD-10-CM

## 2013-07-28 DIAGNOSIS — E669 Obesity, unspecified: Secondary | ICD-10-CM

## 2013-07-28 DIAGNOSIS — E7800 Pure hypercholesterolemia, unspecified: Secondary | ICD-10-CM

## 2013-07-28 MED ORDER — BENAZEPRIL HCL 10 MG PO TABS: ORAL_TABLET | ORAL | Status: DC

## 2013-07-28 MED ORDER — HYDROCHLOROTHIAZIDE 25 MG PO TABS: 25.0000 mg | ORAL_TABLET | Freq: Every day | ORAL | Status: DC

## 2013-07-28 MED ORDER — SYNTHROID 137 MCG PO TABS: 137.0000 ug | ORAL_TABLET | Freq: Every day | ORAL | Status: DC

## 2013-07-28 NOTE — Patient Instructions (Signed)
Fat and Cholesterol Control Diet  Fat and cholesterol levels in your blood and organs are influenced by your diet. High levels of fat and cholesterol may lead to diseases of the heart, small and large blood vessels, gallbladder, liver, and pancreas.  CONTROLLING FAT AND CHOLESTEROL WITH DIET  Although exercise and lifestyle factors are important, your diet is key. That is because certain foods are known to raise cholesterol and others to lower it. The goal is to balance foods for their effect on cholesterol and more importantly, to replace saturated and trans fat with other types of fat, such as monounsaturated fat, polyunsaturated fat, and omega-3 fatty acids.  On average, a person should consume no more than 15 to 17 g of saturated fat daily. Saturated and trans fats are considered "bad" fats, and they will raise LDL cholesterol. Saturated fats are primarily found in animal products such as meats, butter, and cream. However, that does not mean you need to give up all your favorite foods. Today, there are good tasting, low-fat, low-cholesterol substitutes for most of the things you like to eat. Choose low-fat or nonfat alternatives. Choose round or loin cuts of red meat. These types of cuts are lowest in fat and cholesterol. Chicken (without the skin), fish, veal, and ground turkey breast are great choices. Eliminate fatty meats, such as hot dogs and salami. Even shellfish have little or no saturated fat. Have a 3 oz (85 g) portion when you eat lean meat, poultry, or fish.  Trans fats are also called "partially hydrogenated oils." They are oils that have been scientifically manipulated so that they are solid at room temperature resulting in a longer shelf life and improved taste and texture of foods in which they are added. Trans fats are found in stick margarine, some tub margarines, cookies, crackers, and baked goods.   When baking and cooking, oils are a great substitute for butter. The monounsaturated oils are  especially beneficial since it is believed they lower LDL and raise HDL. The oils you should avoid entirely are saturated tropical oils, such as coconut and palm.   Remember to eat a lot from food groups that are naturally free of saturated and trans fat, including fish, fruit, vegetables, beans, grains (barley, rice, couscous, bulgur wheat), and pasta (without cream sauces).   IDENTIFYING FOODS THAT LOWER FAT AND CHOLESTEROL   Soluble fiber may lower your cholesterol. This type of fiber is found in fruits such as apples, vegetables such as broccoli, potatoes, and carrots, legumes such as beans, peas, and lentils, and grains such as barley. Foods fortified with plant sterols (phytosterol) may also lower cholesterol. You should eat at least 2 g per day of these foods for a cholesterol lowering effect.   Read package labels to identify low-saturated fats, trans fat free, and low-fat foods at the supermarket. Select cheeses that have only 2 to 3 g saturated fat per ounce. Use a heart-healthy tub margarine that is free of trans fats or partially hydrogenated oil. When buying baked goods (cookies, crackers), avoid partially hydrogenated oils. Breads and muffins should be made from whole grains (whole-wheat or whole oat flour, instead of "flour" or "enriched flour"). Buy non-creamy canned soups with reduced salt and no added fats.   FOOD PREPARATION TECHNIQUES   Never deep-fry. If you must fry, either stir-fry, which uses very little fat, or use non-stick cooking sprays. When possible, broil, bake, or roast meats, and steam vegetables. Instead of putting butter or margarine on vegetables, use lemon   and herbs, applesauce, and cinnamon (for squash and sweet potatoes). Use nonfat yogurt, salsa, and low-fat dressings for salads.   LOW-SATURATED FAT / LOW-FAT FOOD SUBSTITUTES  Meats / Saturated Fat (g)  · Avoid: Steak, marbled (3 oz/85 g) / 11 g  · Choose: Steak, lean (3 oz/85 g) / 4 g  · Avoid: Hamburger (3 oz/85 g) / 7  g  · Choose: Hamburger, lean (3 oz/85 g) / 5 g  · Avoid: Ham (3 oz/85 g) / 6 g  · Choose: Ham, lean cut (3 oz/85 g) / 2.4 g  · Avoid: Chicken, with skin, dark meat (3 oz/85 g) / 4 g  · Choose: Chicken, skin removed, dark meat (3 oz/85 g) / 2 g  · Avoid: Chicken, with skin, light meat (3 oz/85 g) / 2.5 g  · Choose: Chicken, skin removed, light meat (3 oz/85 g) / 1 g  Dairy / Saturated Fat (g)  · Avoid: Whole milk (1 cup) / 5 g  · Choose: Low-fat milk, 2% (1 cup) / 3 g  · Choose: Low-fat milk, 1% (1 cup) / 1.5 g  · Choose: Skim milk (1 cup) / 0.3 g  · Avoid: Hard cheese (1 oz/28 g) / 6 g  · Choose: Skim milk cheese (1 oz/28 g) / 2 to 3 g  · Avoid: Cottage cheese, 4% fat (1 cup) / 6.5 g  · Choose: Low-fat cottage cheese, 1% fat (1 cup) / 1.5 g  · Avoid: Ice cream (1 cup) / 9 g  · Choose: Sherbet (1 cup) / 2.5 g  · Choose: Nonfat frozen yogurt (1 cup) / 0.3 g  · Choose: Frozen fruit bar / trace  · Avoid: Whipped cream (1 tbs) / 3.5 g  · Choose: Nondairy whipped topping (1 tbs) / 1 g  Condiments / Saturated Fat (g)  · Avoid: Mayonnaise (1 tbs) / 2 g  · Choose: Low-fat mayonnaise (1 tbs) / 1 g  · Avoid: Butter (1 tbs) / 7 g  · Choose: Extra light margarine (1 tbs) / 1 g  · Avoid: Coconut oil (1 tbs) / 11.8 g  · Choose: Olive oil (1 tbs) / 1.8 g  · Choose: Corn oil (1 tbs) / 1.7 g  · Choose: Safflower oil (1 tbs) / 1.2 g  · Choose: Sunflower oil (1 tbs) / 1.4 g  · Choose: Soybean oil (1 tbs) / 2.4 g  · Choose: Canola oil (1 tbs) / 1 g  Document Released: 05/27/2005 Document Revised: 09/21/2012 Document Reviewed: 11/15/2010  Exit[employer]® Patient Information ©2014 [employer].

## 2013-07-28 NOTE — Progress Notes (Signed)
Chief Complaint  Patient presents with  . Hypertension    nonfasting med check, labs already done.     Hypertension follow-up:  Blood pressures elsewhere are 126/78 (at home).  Denies dizziness, headaches, chest pain, edema.  Denies side effects of medications, muscle cramps, cough. Leg cramps diminished after switching to 1/2 salt and 1/2 "no salt" (salt substitute with potassium).  She got a treadmill--walking/jogging 3 miles, 2-3 times/week  Hyperlipidemia:  Last check was borderline, with LDL just over 130.  She eats 2 eggs daily (has chickens); rare cheese; butter daily (with breakfast); red meat at least 3-4x/week (ham, roast beef, venison, pork).  +french fries.  +Caesar salad every night (with caesar dressing)  Past Medical History  Diagnosis Date  . Rheumatoid arthritis(714.0) 04/2006    Dr. Corliss Skains manages  . Hypothyroidism 2001  . Hypertension    Past Surgical History  Procedure Laterality Date  . Pilonidal cyst excision  1983  . Tonsillectomy  age 35  . Wisdom tooth extraction     History   Social History  . Marital Status: Unknown    Spouse Name: N/A    Number of Children: N/A  . Years of Education: N/A   Occupational History  . teaches biology/ecology at [employer]    Social History Main Topics  . Smoking status: Former Smoker -- 1.50 packs/day for 26 years    Types: Cigarettes    Start date: 01/08/2005  . Smokeless tobacco: Never Used  . Alcohol Use: Yes     Comment: 1-2 drinks (beer) per week.  . Drug Use: No  . Sexual Activity: Yes    Partners: Female   Other Topics Concern  . Not on file   Social History Narrative   Lives with female partner of 46 years--got married 06/11/13. 2 cats, 2 dogs, 1 chicken.   (had to slaughter chicken in order to isolate dog after incident with rabid fox getting into yard; one dog wasn't vaccinated--had to put down)    Outpatient Encounter Prescriptions as of 07/28/2013  Medication Sig  . aspirin 81 MG  tablet Take 81 mg by mouth every other day.   . benazepril (LOTENSIN) 10 MG tablet TAKE 1 TABLET BY MOUTH EVERY DAY  . BLACK COHOSH PO Take 2 tablets by mouth daily.  Elgie Collard SURECLICK 50 MG/ML injection Inject 50 mg as directed once a week.   Marland Kitchen FABB 2.2-25-1 MG TABS Take 1 tablet by mouth daily.   . fish oil-omega-3 fatty acids 1000 MG capsule Take 1 g by mouth daily.  . hydrochlorothiazide (HYDRODIURIL) 25 MG tablet Take 25 mg by mouth daily.  . methotrexate 25 MG/ML injection Inject 25 mg/m2 into the vein once.  . Nutritional Supplements (EQ ESTROBLEND MENOPAUSE PO) Take 1 tablet by mouth daily.  Marland Kitchen SYNTHROID 137 MCG tablet Take 1 tablet (137 mcg total) by mouth daily.  . [DISCONTINUED] benazepril (LOTENSIN) 10 MG tablet Take 1 tablet (10 mg total) by mouth daily.  . [DISCONTINUED] hydrochlorothiazide (HYDRODIURIL) 25 MG tablet TAKE 1 TABLET BY MOUTH EVERY DAY   No Known Allergies  ROS:  3 weeks of URI symptoms in January, and eventually resolved on its own. Thought she had a gallstone attack in mid-January. Had severe pain after eating. No further problems--never had vomiting, jaundice or fever. Denies fevers, chills, nausea, vomiting, bowel changes, headaches, dizziness, chest pain, palpitations, edema, bleeding, bruising, or other concerns.  URI symptoms resolved.  No shortness of breath or cough.  PHYSICAL EXAM:  BP 130/80  Pulse 64  Ht 5\' 8"  (1.727 m)  Wt 248 lb (112.492 kg)  BMI 37.72 kg/m2 Well developed, pleasant, obese female in no distress HEENT:  PERRL, EOMI, conjunctiva clear.  OP clear Neck: no lymphadenopathy, thyromegaly or mass Heart: regular rate and rhythm without murmur Lungs: clear bilaterally Abdomen: soft, nontender, no organomegaly or mass Extremities: no edema, 2+ pulse Neuro: alert and oriented, cranial nerves intact. Normal strength, gait Psych: normal mood, affect, hygiene and grooming.    Chemistry      Component Value Date/Time   NA 138 07/08/2013  0938   K 4.3 07/08/2013 0938   CL 104 07/08/2013 0938   CO2 27 07/08/2013 0938   BUN 15 07/08/2013 0938   CREATININE 0.73 07/08/2013 0938      Component Value Date/Time   CALCIUM 9.1 07/08/2013 0938   ALKPHOS 52 07/08/2013 0938   AST 23 07/08/2013 0938   ALT 42* 07/08/2013 0938   BILITOT 0.6 07/08/2013 0938     Glucose 93  Lab Results  Component Value Date   CHOL 202* 07/08/2013   HDL 51 07/08/2013   LDLCALC 134* 07/08/2013   TRIG 84 07/08/2013   CHOLHDL 4.0 07/08/2013   Lab Results  Component Value Date   TSH 2.824 07/08/2013   ASSESSMENT/PLAN:  Unspecified hypothyroidism - adequate replaced on current med - Plan: SYNTHROID 137 MCG tablet  Essential hypertension, benign - controlled - Plan: benazepril (LOTENSIN) 10 MG tablet, hydrochlorothiazide (HYDRODIURIL) 25 MG tablet  Pure hypercholesterolemia - unchanged; mildly elevated.  many dietary changes could be made to get LDL<130  Obesity (BMI 30-39.9) - weight loss recommended.  lowfat diet reviewed.  continue exercise   Reviewed low cholesterol, low sodium, lowfat diet in detail, and specific recommendations were made.  Encouraged weight loss.  Patient will return in the summer for CPE, will come fasting.  Plan to repeat lipids and LFT's at that time (mildly elevated lft on last check)  25 min visit, more than 1/2 spent counseling

## 2013-08-02 ENCOUNTER — Other Ambulatory Visit: Payer: Self-pay | Admitting: Family Medicine

## 2013-11-24 ENCOUNTER — Telehealth: Payer: Self-pay | Admitting: Family Medicine

## 2013-11-24 DIAGNOSIS — I10 Essential (primary) hypertension: Secondary | ICD-10-CM

## 2013-11-24 DIAGNOSIS — Z0000 Encounter for general adult medical examination without abnormal findings: Secondary | ICD-10-CM

## 2013-11-24 DIAGNOSIS — E7800 Pure hypercholesterolemia, unspecified: Secondary | ICD-10-CM

## 2013-11-24 DIAGNOSIS — E039 Hypothyroidism, unspecified: Secondary | ICD-10-CM

## 2013-11-24 NOTE — Telephone Encounter (Signed)
Pt scheduled a lab visit for early august

## 2013-11-24 NOTE — Telephone Encounter (Signed)
Orders have been entered.  Last labs were end of January, so ideally would schedule for August (6 months from last check)

## 2013-11-24 NOTE — Telephone Encounter (Signed)
Pt came in and explained that she needed to cancel her cpe. She stated that she wanted to get her fasting labs while she was here. I explained to her that labs had to have an order and approved by the doc in order to proceed. Pt rescheduled her cpe for October and requested that I put back a request for labs. She would like to have labs drawn before school goes back in session in Aug. Please advise how to precede with this pt.

## 2013-12-01 ENCOUNTER — Encounter: Payer: BC Managed Care – PPO | Admitting: Family Medicine

## 2014-01-10 ENCOUNTER — Other Ambulatory Visit: Payer: BC Managed Care – PPO

## 2014-01-10 DIAGNOSIS — E039 Hypothyroidism, unspecified: Secondary | ICD-10-CM

## 2014-01-10 DIAGNOSIS — Z0000 Encounter for general adult medical examination without abnormal findings: Secondary | ICD-10-CM

## 2014-01-10 DIAGNOSIS — I10 Essential (primary) hypertension: Secondary | ICD-10-CM

## 2014-01-10 DIAGNOSIS — E7800 Pure hypercholesterolemia, unspecified: Secondary | ICD-10-CM

## 2014-01-10 LAB — CBC WITH DIFFERENTIAL/PLATELET
BASOS ABS: 0.1 10*3/uL (ref 0.0–0.1)
Basophils Relative: 1 % (ref 0–1)
Eosinophils Absolute: 0.3 10*3/uL (ref 0.0–0.7)
Eosinophils Relative: 6 % — ABNORMAL HIGH (ref 0–5)
HCT: 41.8 % (ref 36.0–46.0)
Hemoglobin: 14.1 g/dL (ref 12.0–15.0)
LYMPHS PCT: 26 % (ref 12–46)
Lymphs Abs: 1.4 10*3/uL (ref 0.7–4.0)
MCH: 31.7 pg (ref 26.0–34.0)
MCHC: 33.7 g/dL (ref 30.0–36.0)
MCV: 93.9 fL (ref 78.0–100.0)
Monocytes Absolute: 0.5 10*3/uL (ref 0.1–1.0)
Monocytes Relative: 9 % (ref 3–12)
Neutro Abs: 3 10*3/uL (ref 1.7–7.7)
Neutrophils Relative %: 58 % (ref 43–77)
PLATELETS: 305 10*3/uL (ref 150–400)
RBC: 4.45 MIL/uL (ref 3.87–5.11)
RDW: 14.8 % (ref 11.5–15.5)
WBC: 5.2 10*3/uL (ref 4.0–10.5)

## 2014-01-11 LAB — COMPREHENSIVE METABOLIC PANEL
ALT: 19 U/L (ref 0–35)
AST: 15 U/L (ref 0–37)
Albumin: 4.3 g/dL (ref 3.5–5.2)
Alkaline Phosphatase: 54 U/L (ref 39–117)
BUN: 15 mg/dL (ref 6–23)
CALCIUM: 9.4 mg/dL (ref 8.4–10.5)
CO2: 29 meq/L (ref 19–32)
CREATININE: 0.89 mg/dL (ref 0.50–1.10)
Chloride: 101 mEq/L (ref 96–112)
GLUCOSE: 96 mg/dL (ref 70–99)
Potassium: 4.2 mEq/L (ref 3.5–5.3)
SODIUM: 139 meq/L (ref 135–145)
Total Bilirubin: 0.7 mg/dL (ref 0.2–1.2)
Total Protein: 6.5 g/dL (ref 6.0–8.3)

## 2014-01-11 LAB — LIPID PANEL
CHOLESTEROL: 189 mg/dL (ref 0–200)
HDL: 43 mg/dL (ref >39)
LDL Cholesterol: 113 mg/dL — ABNORMAL HIGH (ref 0–99)
TRIGLYCERIDES: 165 mg/dL — AB (ref <150)
Total CHOL/HDL Ratio: 4.4 Ratio
VLDL: 33 mg/dL (ref 0–40)

## 2014-01-11 LAB — TSH: TSH: 3.422 u[IU]/mL (ref 0.350–4.500)

## 2014-03-30 ENCOUNTER — Encounter: Payer: BC Managed Care – PPO | Admitting: Family Medicine

## 2014-08-01 ENCOUNTER — Other Ambulatory Visit: Payer: Self-pay | Admitting: Family Medicine

## 2014-08-01 NOTE — Telephone Encounter (Signed)
Ok to refill until May's visit

## 2014-08-01 NOTE — Telephone Encounter (Signed)
Has appt scheduled for May-are these okay to fill?

## 2014-09-07 ENCOUNTER — Other Ambulatory Visit: Payer: Self-pay | Admitting: Family Medicine

## 2014-10-31 ENCOUNTER — Encounter: Payer: Self-pay | Admitting: Family Medicine

## 2014-10-31 ENCOUNTER — Ambulatory Visit (INDEPENDENT_AMBULATORY_CARE_PROVIDER_SITE_OTHER): Payer: BC Managed Care – PPO | Admitting: Family Medicine

## 2014-10-31 VITALS — BP 126/82 | HR 64 | Ht 68.0 in | Wt 254.0 lb

## 2014-10-31 DIAGNOSIS — E782 Mixed hyperlipidemia: Secondary | ICD-10-CM | POA: Diagnosis not present

## 2014-10-31 DIAGNOSIS — E669 Obesity, unspecified: Secondary | ICD-10-CM

## 2014-10-31 DIAGNOSIS — Z5181 Encounter for therapeutic drug level monitoring: Secondary | ICD-10-CM | POA: Diagnosis not present

## 2014-10-31 DIAGNOSIS — E039 Hypothyroidism, unspecified: Secondary | ICD-10-CM | POA: Diagnosis not present

## 2014-10-31 DIAGNOSIS — R7303 Prediabetes: Secondary | ICD-10-CM

## 2014-10-31 DIAGNOSIS — Z1211 Encounter for screening for malignant neoplasm of colon: Secondary | ICD-10-CM | POA: Diagnosis not present

## 2014-10-31 DIAGNOSIS — Z0000 Encounter for general adult medical examination without abnormal findings: Secondary | ICD-10-CM

## 2014-10-31 DIAGNOSIS — R7309 Other abnormal glucose: Secondary | ICD-10-CM

## 2014-10-31 DIAGNOSIS — I10 Essential (primary) hypertension: Secondary | ICD-10-CM

## 2014-10-31 DIAGNOSIS — M069 Rheumatoid arthritis, unspecified: Secondary | ICD-10-CM | POA: Diagnosis not present

## 2014-10-31 LAB — COMPREHENSIVE METABOLIC PANEL WITH GFR
ALT: 23 U/L (ref 0–35)
AST: 20 U/L (ref 0–37)
Albumin: 4.4 g/dL (ref 3.5–5.2)
Alkaline Phosphatase: 55 U/L (ref 39–117)
BUN: 15 mg/dL (ref 6–23)
CO2: 28 meq/L (ref 19–32)
Calcium: 9.4 mg/dL (ref 8.4–10.5)
Chloride: 101 meq/L (ref 96–112)
Creat: 0.7 mg/dL (ref 0.50–1.10)
Glucose, Bld: 98 mg/dL (ref 70–99)
Potassium: 4.1 meq/L (ref 3.5–5.3)
Sodium: 140 meq/L (ref 135–145)
Total Bilirubin: 0.6 mg/dL (ref 0.2–1.2)
Total Protein: 6.7 g/dL (ref 6.0–8.3)

## 2014-10-31 LAB — POCT URINALYSIS DIPSTICK
Bilirubin, UA: NEGATIVE
Blood, UA: NEGATIVE
Glucose, UA: NEGATIVE
Ketones, UA: NEGATIVE
Leukocytes, UA: NEGATIVE
Nitrite, UA: NEGATIVE
Protein, UA: NEGATIVE
Spec Grav, UA: 1.02
Urobilinogen, UA: NEGATIVE
pH, UA: 6

## 2014-10-31 LAB — LIPID PANEL
Cholesterol: 206 mg/dL — ABNORMAL HIGH (ref 0–200)
HDL: 49 mg/dL
LDL Cholesterol: 132 mg/dL — ABNORMAL HIGH (ref 0–99)
Total CHOL/HDL Ratio: 4.2 ratio
Triglycerides: 127 mg/dL
VLDL: 25 mg/dL (ref 0–40)

## 2014-10-31 LAB — TSH: TSH: 0.915 u[IU]/mL (ref 0.350–4.500)

## 2014-10-31 LAB — HEMOGLOBIN A1C
Hgb A1c MFr Bld: 6.1 % — ABNORMAL HIGH
Mean Plasma Glucose: 128 mg/dL — ABNORMAL HIGH

## 2014-10-31 MED ORDER — HYDROCHLOROTHIAZIDE 25 MG PO TABS: 25.0000 mg | ORAL_TABLET | Freq: Every day | ORAL | Status: DC

## 2014-10-31 MED ORDER — BENAZEPRIL HCL 10 MG PO TABS: 10.0000 mg | ORAL_TABLET | Freq: Every day | ORAL | Status: DC

## 2014-10-31 NOTE — Progress Notes (Signed)
Chief Complaint  Patient presents with  . Annual Exam    fasting annual exam without pap (does not want to have done). Did not do eye exam, she is UTD and just had one with Dr.Jones at [facility]. No concerns.     Jill Mathews is a 49 y.o. female who presents for a complete physical.  She has the following concerns:  Hypertension follow-up: Blood pressures elsewhere are 120's/70 (at home). Denies dizziness, headaches, chest pain, edema. Denies side effects of medications, muscle cramps, cough. Leg cramps diminished after switching to 1/2 salt and 1/2 "no salt" (salt substitute with potassium)--uses this in her water with lemon juice and splenda for hiking/biking.   Hyperlipidemia:Last check was 01/2014, and LDL had improved from levels 6 months earlier, with dietary changes. Still had slightly elevated TG and elevated chol/HDL ratio. Lab Results  Component Value Date   CHOL 189 01/10/2014   HDL 43 01/10/2014   LDLCALC 113* 01/10/2014   TRIG 165* 01/10/2014   CHOLHDL 4.4 01/10/2014   She eats 1 egg daily (has chickens); cheese infrequently; fish 2x/week (salmon, tuna, fake crab); butter no longer daily (with breakfast); red meat at least 3-4x/week (ham, roast beef, venison, pork), as well as chicken.Switched from [employer] to garden salad with light vinaigrette and some feta cheese. She saw a nutritionist after last visit, but wasn't covered by insurance.  Interested in further dietary improvement, willing to go to [facility].  Hypothyroidism:  Last TSH was 3.422 in 01/2014.  Compliant with taking Synthroid daily, on an empty stomach, separate from her vitamins.  No hair/skin/bowel changes. +weight gain--she attributes some to holidays, a knee injury which limited exercise, and then prednisone from RA flare (April 2016).  RA:  Under the care of Dr. Estanislado Pandy, saw her last week.  She has had a couple of flares in the last year requiring prednisone course. She gets CBC and sugar done  every 3 months, as well as some other labs including liver tests.  No records are in the chart. She recalls last sugar was elevated, but was nonfasting.   Immunization History  Administered Date(s) Administered  . Influenza Split 03/10/2012  . Pneumococcal Polysaccharide-23 07/02/2006  . Tdap 09/09/2012  has rx for another pneumonia vaccine from Dr. Estanislado Pandy (doesn't have with her--unsure of which type is recommended).  Also has order for TB test to be done. Last Pap smear: Age 24--had a very bad experience and refuses--will only see a lesbian GYN; declines exam today. Previously recommended OB-GYN, but she admits she will not go. Last mammogram: never and refuses Last colonoscopy: never, willing to have Last DEXA:never Dentist: every 6 months Ophtho: yearly (due to meds) Exercise: starting to ride her bike again (10 miles once on the weekends), 4.7 mile hikes with 17# pack 2-3 times/week. Treadmill at home 30-60 minutes if she can't get outside due to weather (at least 3 miles). Some weight lifting (hoping to do [employer]). 1 mile daily (with the dogs),  Past Medical History  Diagnosis Date  . Rheumatoid arthritis(714.0) 04/2006    Dr. Estanislado Pandy manages  . Hypothyroidism 2001  . Hypertension   . Hyperlipidemia     borderline; dietary management    Past Surgical History  Procedure Laterality Date  . Pilonidal cyst excision  1983  . Tonsillectomy  age 23  . Wisdom tooth extraction      History   Social History  . Marital Status: Unknown    Spouse Name: N/A  .  Number of Children: N/A  . Years of Education: N/A   Occupational History  . teaches biology/ecology at [employer]  . Smoking status: Former Smoker -- 1.50 packs/day for 26 years    Types: Cigarettes    Start date: 01/08/2005  . Smokeless tobacco: Never Used  . Alcohol Use: Yes     Comment: 1-2 drinks (beer) per week.  . Drug Use: No  . Sexual Activity:     Partners: Female   Other Topics Concern  . Not on file   Social History Narrative   Lives with female partner of 78 years--got married 06/11/13. 1 cats, 2 dogs, 9 chickens.   (had to slaughter chicken in order to isolate dog after incident with rabid fox getting into yard; one dog wasn't vaccinated--had to put down)    Family History  Problem Relation Age of Onset  . Heart disease Mother 63    MI, stents x 2  . Stroke Mother 38  . Hyperlipidemia Mother   . Hypertension Mother   . Depression Mother   . Diabetes Mother   . Thyroid disease Mother     Graves disease, s/p RAI, now hypothyroid  . Irritable bowel syndrome Mother   . COPD Mother   . Colon polyps Mother   . Cancer Mother     skin (not melanoma)  . Kidney Stones Mother   . Stroke Father 89  . Hyperlipidemia Father   . Hypertension Father   . Depression Father   . Diabetes Father   . Thyroid disease Father     hypothyroidism  . COPD Father   . Heart disease Father 53    MI; s/p CABG; pacemaker  . Cancer Sister 57    pancreatic cancer  . Bipolar disorder Sister   . Lupus Paternal Aunt    Outpatient Encounter Prescriptions as of 10/31/2014  Medication Sig  . aspirin 81 MG tablet Take 81 mg by mouth every other day.   . benazepril (LOTENSIN) 10 MG tablet Take 1 tablet (10 mg total) by mouth daily.  Marland Kitchen BLACK COHOSH PO Take 2 tablets by mouth daily.  Scarlette Shorts SURECLICK 50 MG/ML injection Inject 50 mg as directed once a week.   Marland Kitchen FABB 2.2-25-1 MG TABS Take 1 tablet by mouth daily.   . fish oil-omega-3 fatty acids 1000 MG capsule Take 1 g by mouth daily.  . hydrochlorothiazide (HYDRODIURIL) 25 MG tablet Take 1 tablet (25 mg total) by mouth daily.  . methotrexate 25 MG/ML injection Inject 25 mg/m2 into the vein once.  . Nutritional Supplements (EQ ESTROBLEND MENOPAUSE PO) Take 1 tablet by mouth daily.  Marland Kitchen SYNTHROID 137 MCG tablet TAKE 1 TABLET BY MOUTH DAILY  . Turmeric 500 MG CAPS Take 1 capsule by mouth 2 (two) times  daily.  . [DISCONTINUED] benazepril (LOTENSIN) 10 MG tablet TAKE 1 TABLET BY MOUTH EVERY DAY  . [DISCONTINUED] hydrochlorothiazide (HYDRODIURIL) 25 MG tablet TAKE 1 TABLET BY MOUTH DAILY  . [DISCONTINUED] VIRT-VITE 2.5-25-1 MG TABS tablet    No facility-administered encounter medications on file as of 10/31/2014.   No Known Allergies  ROS: The patient denies anorexia, fever, headaches, vision changes, decreased hearing, ear pain, sore throat, breast concerns, chest pain, palpitations, dizziness, syncope, dyspnea on exertion, cough, swelling, nausea, vomiting, diarrhea, constipation, abdominal pain, melena, hematochezia, indigestion/heartburn, hematuria, incontinence, dysuria, vaginal discharge, odor or itch, genital lesions, numbness, tingling, weakness, tremor, suspicious skin lesions, depression, anxiety, abnormal bleeding/bruising,  or enlarged lymph nodes. She has gained 6# since her last visit here over a year ago.  No longer having periods, last was over a year ago. Mild hot flashes, tolerable with the supplements she takes. Pain at wrists, elbows, shoulders, knees--only intermittent. And also left 5th toe. Hoarseness-- has seen ENT, told inflammation of vocal cords, felt to be related to her RA by ENT; improves with prednisone--no longer chronic, just sporadic. Occasional insomnia. Worried about mole on her nose--getting larger and darker.  Wants to see dermatologist.   PHYSICAL EXAM:  BP 126/82 mmHg  Pulse 64  Ht '5\' 8"'  (1.727 m)  Wt 254 lb (115.214 kg)  BMI 38.63 kg/m2  General Appearance:   Alert, cooperative, no distress, appears stated age  Head:   Normocephalic, without obvious abnormality, atraumatic  Eyes:   PERRL, conjunctiva/corneas clear, EOM's intact, fundi   benign  Ears:   Normal TM's and external ear canals  Nose:  Nares normal, mucosa normal, no drainage or sinus tenderness  Throat:  Lips, mucosa, and tongue normal; teeth and gums normal   Neck:  Supple, no lymphadenopathy; thyroid: no enlargement/tenderness/nodules; no carotid  bruit or JVD  Back:  Spine nontender, no curvature, ROM normal, no CVA tenderness  Lungs:   Clear to auscultation bilaterally without wheezes, rales or ronchi; respirations unlabored  Chest Wall:   No tenderness or deformity  Heart:   Regular rate and rhythm, S1 and S2 normal, no murmur, rub  or gallop  Breast Exam:   no nipple discharge, inversion, skin dimpling, breast masses or axillary adenopathy  Abdomen:   Soft, non-tender, nondistended, normoactive bowel sounds,   no masses, no hepatosplenomegaly  Genitalia:   Refused by patient     Extremities:  No clubbing, cyanosis or edema  Pulses:  2+ and symmetric all extremities  Skin:  Skin color, texture, turgor normal, no rashes  Lymph nodes:  Cervical, supraclavicular, and axillary nodes normal  Neurologic:  CNII-XII intact, normal strength, sensation and gait; reflexes 2+ and symmetric throughout   Psych: Normal mood, affect, hygiene and grooming       Urine dip normal.  ASSESSMENT/PLAN:  Annual physical exam - Plan: POCT Urinalysis Dipstick  Elevated glucose - (nonfasting); screen for diabetes, especially given prednisone use for RA flares, and obesity. - Plan: Hemoglobin A1c  Hypothyroidism, unspecified hypothyroidism type - euthyroid by history - Plan: TSH  Essential hypertension, benign - controlled - Plan: Comprehensive metabolic panel, hydrochlorothiazide (HYDRODIURIL) 25 MG tablet, benazepril (LOTENSIN) 10 MG tablet, Amb ref to Medical Nutrition Therapy-MNT  Rheumatoid arthritis  Obesity (BMI 30-39.9) - Plan: Amb ref to Medical Nutrition Therapy-MNT  Mixed hyperlipidemia - due for recheck; lowfat, low cholesterol diet was reviewed. - Plan: Lipid panel, Amb ref to Medical Nutrition Therapy-MNT  Medication monitoring encounter - Plan:  Comprehensive metabolic panel, TSH  Colon cancer screening - Plan: Ambulatory referral to Gastroenterology  Prediabetes - Plan: Amb ref to Medical Nutrition Therapy-MNT   c-met,lipids, A1c, TSH Send copy to dr. Estanislado Pandy  Discussed monthly self breast exams and yearly mammograms; at least 30 minutes of aerobic activity at least 5 days/week, weight-bearing exercise at least 2x/week; proper sunscreen use reviewed; healthy diet, including goals of calcium and vitamin D intake and alcohol recommendations (less than or equal to 1 drink/day) reviewed; regular seatbelt use; changing batteries in smoke detectors.  Immunization recommendations discussed--she can bring her order for pneumonia vaccine from Dr. Estanislado Pandy and get here, if she prefers. Yearly flu shots are recommended.Marland Kitchen  Colonoscopy recommendations reviewed--referred to GI. Encouraged her to re-consider seeing GYN (or returning here) for pelvic exam.  F/u 1 year, sooner prn based on labs

## 2014-10-31 NOTE — Patient Instructions (Addendum)
  HEALTH MAINTENANCE RECOMMENDATIONS:  It is recommended that you get at least 30 minutes of aerobic exercise at least 5 days/week (for weight loss, you may need as much as 60-90 minutes). This can be any activity that gets your heart rate up. This can be divided in 10-15 minute intervals if needed, but try and build up your endurance at least once a week.  Weight bearing exercise is also recommended twice weekly.  Eat a healthy diet with lots of vegetables, fruits and fiber.  "Colorful" foods have a lot of vitamins (ie green vegetables, tomatoes, red peppers, etc).  Limit sweet tea, regular sodas and alcoholic beverages, all of which has a lot of calories and sugar.  Up to 1 alcoholic drink daily may be beneficial for women (unless trying to lose weight, watch sugars).  Drink a lot of water.  Calcium recommendations are 1200-1500 mg daily (1500 mg for postmenopausal women or women without ovaries), and vitamin D 1000 IU daily.  This should be obtained from diet and/or supplements (vitamins), and calcium should not be taken all at once, but in divided doses.  Monthly self breast exams and yearly mammograms for women over the age of 17 is recommended.  Sunscreen of at least SPF 30 should be used on all sun-exposed parts of the skin when outside between the hours of 10 am and 4 pm (not just when at beach or pool, but even with exercise, golf, tennis, and yard work!)  Use a sunscreen that says "broad spectrum" so it covers both UVA and UVB rays, and make sure to reapply every 1-2 hours.  Remember to change the batteries in your smoke detectors when changing your clock times in the spring and fall.  Use your seat belt every time you are in a car, and please drive safely and not be distracted with cell phones and texting while driving.   I strongly encourage you to get mammograms once every 1-2 years. Please consider seeing Dr. Thornell Mule at [facility] for pap smear (since you preferred me  NOT to do it today).  Names of dermatologists that I use frequently: Dr. Emily Filbert, Danella Deis (entire practice is good)--near [facility]) [facility] Dr. Terri Piedra (a few doors down from this office).

## 2014-11-03 ENCOUNTER — Other Ambulatory Visit: Payer: Self-pay | Admitting: Family Medicine

## 2014-11-18 ENCOUNTER — Encounter: Payer: Self-pay | Admitting: Family Medicine

## 2014-12-06 ENCOUNTER — Other Ambulatory Visit: Payer: Self-pay | Admitting: Family Medicine

## 2014-12-06 NOTE — Telephone Encounter (Signed)
Dr.knapp I am not for sure if this is okay or not

## 2014-12-26 ENCOUNTER — Ambulatory Visit: Payer: BC Managed Care – PPO | Admitting: *Deleted

## 2015-03-03 ENCOUNTER — Other Ambulatory Visit: Payer: Self-pay | Admitting: Family Medicine

## 2015-09-25 ENCOUNTER — Ambulatory Visit (INDEPENDENT_AMBULATORY_CARE_PROVIDER_SITE_OTHER): Payer: BC Managed Care – PPO | Admitting: Family Medicine

## 2015-09-25 ENCOUNTER — Encounter: Payer: Self-pay | Admitting: Family Medicine

## 2015-09-25 VITALS — BP 110/64 | HR 63 | Temp 100.9°F | Resp 20

## 2015-09-25 DIAGNOSIS — R05 Cough: Secondary | ICD-10-CM | POA: Diagnosis not present

## 2015-09-25 DIAGNOSIS — J020 Streptococcal pharyngitis: Secondary | ICD-10-CM | POA: Diagnosis not present

## 2015-09-25 DIAGNOSIS — R0789 Other chest pain: Secondary | ICD-10-CM | POA: Diagnosis not present

## 2015-09-25 DIAGNOSIS — R5383 Other fatigue: Secondary | ICD-10-CM

## 2015-09-25 DIAGNOSIS — R6889 Other general symptoms and signs: Secondary | ICD-10-CM | POA: Diagnosis not present

## 2015-09-25 DIAGNOSIS — J111 Influenza due to unidentified influenza virus with other respiratory manifestations: Secondary | ICD-10-CM | POA: Insufficient documentation

## 2015-09-25 DIAGNOSIS — R059 Cough, unspecified: Secondary | ICD-10-CM

## 2015-09-25 LAB — COMPREHENSIVE METABOLIC PANEL
ALBUMIN: 4 g/dL (ref 3.6–5.1)
ALT: 25 U/L (ref 6–29)
AST: 17 U/L (ref 10–35)
Alkaline Phosphatase: 58 U/L (ref 33–130)
BUN: 11 mg/dL (ref 7–25)
CHLORIDE: 101 mmol/L (ref 98–110)
CO2: 27 mmol/L (ref 20–31)
CREATININE: 0.9 mg/dL (ref 0.50–1.05)
Calcium: 8.9 mg/dL (ref 8.6–10.4)
Glucose, Bld: 121 mg/dL — ABNORMAL HIGH (ref 65–99)
Potassium: 4.4 mmol/L (ref 3.5–5.3)
Sodium: 138 mmol/L (ref 135–146)
Total Bilirubin: 0.6 mg/dL (ref 0.2–1.2)
Total Protein: 6.5 g/dL (ref 6.1–8.1)

## 2015-09-25 LAB — CBC WITH DIFFERENTIAL/PLATELET
BASOS PCT: 0 %
Basophils Absolute: 0 cells/uL (ref 0–200)
Eosinophils Absolute: 117 cells/uL (ref 15–500)
Eosinophils Relative: 1 %
HEMATOCRIT: 39.4 % (ref 35.0–45.0)
Hemoglobin: 13 g/dL (ref 11.7–15.5)
Lymphocytes Relative: 8 %
Lymphs Abs: 936 cells/uL (ref 850–3900)
MCH: 31.3 pg (ref 27.0–33.0)
MCHC: 33 g/dL (ref 32.0–36.0)
MCV: 94.9 fL (ref 80.0–100.0)
MONO ABS: 468 {cells}/uL (ref 200–950)
MPV: 10 fL (ref 7.5–12.5)
Monocytes Relative: 4 %
Neutro Abs: 10179 cells/uL — ABNORMAL HIGH (ref 1500–7800)
Neutrophils Relative %: 87 %
PLATELETS: 240 10*3/uL (ref 140–400)
RBC: 4.15 MIL/uL (ref 3.80–5.10)
RDW: 14.8 % (ref 11.0–15.0)
WBC: 11.7 10*3/uL — AB (ref 4.0–10.5)

## 2015-09-25 LAB — POC INFLUENZA A&B (BINAX/QUICKVUE)
INFLUENZA A, POC: POSITIVE — AB
INFLUENZA B, POC: POSITIVE — AB

## 2015-09-25 LAB — POCT RAPID STREP A (OFFICE): Rapid Strep A Screen: POSITIVE — AB

## 2015-09-25 LAB — POCT CBG (FASTING - GLUCOSE)-MANUAL ENTRY: GLUCOSE FASTING, POC: 183 mg/dL — AB (ref 70–99)

## 2015-09-25 MED ORDER — BENZONATATE 200 MG PO CAPS: 200.0000 mg | ORAL_CAPSULE | Freq: Two times a day (BID) | ORAL | Status: DC | PRN

## 2015-09-25 MED ORDER — PENICILLIN G BENZATHINE & PROC 1200000 UNIT/2ML IM SUSP
1.2000 10*6.[IU] | Freq: Once | INTRAMUSCULAR | Status: AC
  Administered 2015-09-25: 1.2 10*6.[IU] via INTRAMUSCULAR

## 2015-09-25 NOTE — Addendum Note (Signed)
Addended by: Herminio Commons A on: 09/25/2015 01:51 PM   Modules accepted: Orders

## 2015-09-25 NOTE — Patient Instructions (Addendum)
You tested positive for strep and flu today.  You received a Penicillin injection today.  Stay well hydrated. Tessalon cough medication sent to pharmacy. If you get worse, you will need to go to the emergency department.  We will call you tomorrow to make sure you are improving.   Strep Throat Strep throat is a bacterial infection of the throat. Your health care provider may call the infection tonsillitis or pharyngitis, depending on whether there is swelling in the tonsils or at the back of the throat. Strep throat is most common during the cold months of the year in children who are 74-59 years of age, but it can happen during any season in people of any age. This infection is spread from person to person (contagious) through coughing, sneezing, or close contact. CAUSES Strep throat is caused by the bacteria called Streptococcus pyogenes. RISK FACTORS This condition is more likely to develop in:  People who spend time in crowded places where the infection can spread easily.  People who have close contact with someone who has strep throat. SYMPTOMS Symptoms of this condition include:  Fever or chills.   Redness, swelling, or pain in the tonsils or throat.  Pain or difficulty when swallowing.  White or yellow spots on the tonsils or throat.  Swollen, tender glands in the neck or under the jaw.  Red rash all over the body (rare). DIAGNOSIS This condition is diagnosed by performing a rapid strep test or by taking a swab of your throat (throat culture test). Results from a rapid strep test are usually ready in a few minutes, but throat culture test results are available after one or two days. TREATMENT This condition is treated with antibiotic medicine. HOME CARE INSTRUCTIONS Medicines  Take over-the-counter and prescription medicines only as told by your health care provider.  Take your antibiotic as told by your health care provider. Do not stop taking the antibiotic even if you  start to feel better.  Have family members who also have a sore throat or fever tested for strep throat. They may need antibiotics if they have the strep infection. Eating and Drinking  Do not share food, drinking cups, or personal items that could cause the infection to spread to other people.  If swallowing is difficult, try eating soft foods until your sore throat feels better.  Drink enough fluid to keep your urine clear or pale yellow. General Instructions  Gargle with a salt-water mixture 3-4 times per day or as needed. To make a salt-water mixture, completely dissolve -1 tsp of salt in 1 cup of warm water.  Make sure that all household members wash their hands well.  Get plenty of rest.  Stay home from school or work until you have been taking antibiotics for 24 hours.  Keep all follow-up visits as told by your health care provider. This is important. SEEK MEDICAL CARE IF:  The glands in your neck continue to get bigger.  You develop a rash, cough, or earache.  You cough up a thick liquid that is green, yellow-brown, or bloody.  You have pain or discomfort that does not get better with medicine.  Your problems seem to be getting worse rather than better.  You have a fever. SEEK IMMEDIATE MEDICAL CARE IF:  You have new symptoms, such as vomiting, severe headache, stiff or painful neck, chest pain, or shortness of breath.  You have severe throat pain, drooling, or changes in your voice.  You have swelling of the  neck, or the skin on the neck becomes red and tender.  You have signs of dehydration, such as fatigue, dry mouth, and decreased urination.  You become increasingly sleepy, or you cannot wake up completely.  Your joints become red or painful.   This information is not intended to replace advice given to you by your health care provider. Make sure you discuss any questions you have with your health care provider.   Document Released: 05/24/2000 Document  Revised: 02/15/2015 Document Reviewed: 09/19/2014 Elsevier Interactive Patient Education 2016 Else[employer] Inc[employer]  Influenza, Adult Influenza ("the flu") is a viral infection of the respiratory tract. It occurs more often in winter months because people spend more time in close contact with one another. Influenza can make you feel very sick. Influenza easily spreads from person to person (contagious). CAUSES  Influenza is caused by a virus that infects the respiratory tract. You can catch the virus by breathing in droplets from an infected person's cough or sneeze. You can also catch the virus by touching something that was recently contaminated with the virus and then touching your mouth, nose, or eyes. RISKS AND COMPLICATIONS You may be at risk for a more severe case of influenza if you smoke cigarettes, have diabetes, have chronic heart disease (such as heart failure) or lung disease (such as asthma), or if you have a weakened immune system. Elderly people and pregnant women are also at risk for more serious infections. The most common problem of influenza is a lung infection (pneumonia). Sometimes, this problem can require emergency medical care and may be life threatening. SIGNS AND SYMPTOMS  Symptoms typically last 4 to 10 days and may include:  Fever.  Chills.  Headache, body aches, and muscle aches.  Sore throat.  Chest discomfort and cough.  Poor appetite.  Weakness or feeling tired.  Dizziness.  Nausea or vomiting. DIAGNOSIS  Diagnosis of influenza is often made based on your history and a physical exam. A nose or throat swab test can be done to confirm the diagnosis. TREATMENT  In mild cases, influenza goes away on its own. Treatment is directed at relieving symptoms. For more severe cases, your health care provider may prescribe antiviral medicines to shorten the sickness. Antibiotic medicines are not effective because the infection is caused by a virus, not by bacteria. HOME  CARE INSTRUCTIONS  Take medicines only as directed by your health care provider.  Use a cool mist humidifier to make breathing easier.  Get plenty of rest until your temperature returns to normal. This usually takes 3 to 4 days.  Drink enough fluid to keep your urine clear or pale yellow.  Cover yourmouth and nosewhen coughing or sneezing,and wash your handswellto prevent thevirusfrom spreading.  Stay homefromwork orschool untilthe fever is gonefor at least 65full day. PREVENTION  An annual influenza vaccination (flu shot) is the best way to avoid getting influenza. An annual flu shot is now routinely recommended for all adults in the [place] SEEK MEDICAL CARE IF:  You experiencechest pain, yourcough worsens,or you producemore mucus.  Youhave nausea,vomiting, ordiarrhea.  Your fever returns or gets worse. SEEK IMMEDIATE MEDICAL CARE IF:  You havetrouble breathing, you become short of breath,or your skin ornails becomebluish.  You have severe painor stiffnessin the neck.  You develop a sudden headache, or pain in the face or ear.  You have nausea or vomiting that you cannot control. MAKE SURE YOU:   Understand these instructions.  Will watch your condition.  Will get help right  away if you are not doing well or get worse.   This information is not intended to replace advice given to you by your health care provider. Make sure you discuss any questions you have with your health care provider.   Document Released: 05/24/2000 Document Revised: 06/17/2014 Document Reviewed: 08/26/2011 Elsevier Interactive Patient Education [employer]

## 2015-09-25 NOTE — Progress Notes (Signed)
   Subjective:    Patient ID: Jill Mathews, female    DOB: 1965-06-22, 50 y.o.   MRN: [id number]  HPI Chief Complaint  Patient presents with  . sick    hot flashes, trouble breathing, sore throat, burning, wheezing , weak dizzy, fever wednesday and thursday, whole body hurts   Patient was lying on lobby floor, did not fall, states she just feels so bad she needed to lay down. This provider and office manager helped to her feet and assisted her in ambulating to a room.  States she has been sick for past 6 days and symptoms began with sore throat and feeling hot, also reports decreased energy, nasal congestion, drainage and sneezing.  Dry cough for past 5 days but states she feels like she has "a ton of mucous in chest". States she feels dizzy when standing, has generalized body aches and admits some shortness of breath with activity.  Denies chest pain, headache, neck pain, abdominal pain, nausea, vomiting, diarrhea, back pain.  States she is eating and drinking adequately, voiding clear yellow urine and had a normal bowel movement this morning.  Ate a banana this morning.   She did receive flu shot. No known sick contacts. Does not smoke. History of bronchitis, denies asthma or pneumonia history.   Reviewed allergies, medications, past medical, social history.   Review of Systems Pertinent positives and negatives in the history of present illness.     Objective:   Physical Exam  BP 110/64 mmHg  Pulse 63  Temp(Src) 100.9 F (38.3 C) (Tympanic)  Resp 20  SpO2 97%  Repeat pulse ox 97%, HR 64, RR 16   Alert and oriented and in no acute distress. No sinus tenderness. Tympanic membranes and canals are normal. Pharyngeal area is erythematous.  Neck is supple without adenopathy or thyromegaly. Cardiac exam shows a regular sinus rhythm without murmurs or gallops. Lungs are clear to auscultation. Extremity exam shows no edema, pulses intact and normal. PERRLA, EOMs intact, CN II-XII  intact.   Orthostatic vitals: not postural Glucose finger stick: 183 Strep swab: postive Flu swab: positive   ECG indication: dizziness, fatigue NSR Rate 74       Assessment & Plan:  Influenza  Other fatigue - Plan: CBC with Differential/Platelet, Comprehensive metabolic panel, POCT CBG (Fasting - Glucose)  Cough - Plan: DG Chest 2 View, benzonatate (TESSALON) 200 MG capsule  Flu-like symptoms - Plan: POC Influenza A&B(BINAX/QUICKVUE), DG Chest 2 View  Pressure in chest - Plan: EKG 12-Lead, DG Chest 2 View  Strep pharyngitis - Plan: POCT rapid strep A  Discussed that she is positive for influenza and strep. Penicillin given IM in office. She is not a candidate for Tamiflu. She is not orthostatic, neuro and cardiopulmonary exams are normal. Tessalon prescribed for cough. Recommend symptomatic treatment including Ibuprofen 800 mg for fever and body aches and staying well hydrated. Salt water gargles for sore throat. Chest XR ordered, patient will go tomorrow, states she just wants to go home and rest now. Discussed red flags for pneumonia and that I would like to rule this out with chest XR.  Mother is here and driving her. Recommend that if she gets worse to go to the emergency department for further evaluation and IV fluids. Currently she is able to keep down oral fluids and does not appear dehydrated.  Will call and check in with her tomorrow.

## 2015-09-26 ENCOUNTER — Telehealth: Payer: Self-pay | Admitting: Internal Medicine

## 2015-09-26 NOTE — Telephone Encounter (Signed)
Called and spoke to patient this morning to see how she was feeling. Pt is feeling alittle bit better than yesterday but still sick. She feels the penicillin shot made a difference. The tessalon perles are working for her as her cough is now productive. She feels like she still might have a fever has she is still hot. shes having trouble sleeping as she wakes up a lot. She was eating an egg sandwich and some tea when i called. Advised pt to stay hydrated and if she needs to be seen again if she's getting worse to let us know so we can get her in.

## 2015-09-27 ENCOUNTER — Telehealth: Payer: Self-pay | Admitting: *Deleted

## 2015-09-27 NOTE — Telephone Encounter (Signed)
Spoke to patient and told her to take benadryl and to use cool compresses or cool showers. She was told it could be a reaction to penicillin or it could be mono and to rest, stay hydrated and protect spleen. Pt was advised to go get xray done and return next week for follow-up

## 2015-09-27 NOTE — Telephone Encounter (Signed)
Patient called and stated that she has developed a rash from the Bicillin. Started last night on her face and has spread into her hair and front of her chest. Feels like a sunburn and is itchy. Please advise, thanks.

## 2015-09-27 NOTE — Telephone Encounter (Signed)
This may be related to the medication and I recommend that she take Benadryl 25 mg to help with itching, she can also use cool compresses and cool showers for itch relief. Does the rash look like large hives, or small pinpoint and rough?   If this gets worse, spreads or is not improving in the next 24 hours she may need to be seen. If she has any oral swelling or shortness of breath, she will need to go to the ED.  We will make a note in her chart that this happened with penicillin.

## 2015-09-28 ENCOUNTER — Ambulatory Visit
Admission: RE | Admit: 2015-09-28 | Discharge: 2015-09-28 | Disposition: A | Discharge status: Home / Self Care | Payer: BC Managed Care – PPO | Source: Ambulatory Visit | Attending: Family Medicine | Admitting: Family Medicine

## 2015-09-28 DIAGNOSIS — R0789 Other chest pain: Secondary | ICD-10-CM

## 2015-09-28 DIAGNOSIS — R05 Cough: Secondary | ICD-10-CM

## 2015-09-28 DIAGNOSIS — R059 Cough, unspecified: Secondary | ICD-10-CM

## 2015-09-28 DIAGNOSIS — R6889 Other general symptoms and signs: Secondary | ICD-10-CM

## 2015-10-03 ENCOUNTER — Encounter: Payer: Self-pay | Admitting: Family Medicine

## 2015-10-03 ENCOUNTER — Ambulatory Visit (INDEPENDENT_AMBULATORY_CARE_PROVIDER_SITE_OTHER): Payer: BC Managed Care – PPO | Admitting: Family Medicine

## 2015-10-03 VITALS — BP 110/68 | HR 68 | Temp 98.0°F | Resp 16 | Wt 259.4 lb

## 2015-10-03 DIAGNOSIS — R5383 Other fatigue: Secondary | ICD-10-CM | POA: Diagnosis not present

## 2015-10-03 DIAGNOSIS — J111 Influenza due to unidentified influenza virus with other respiratory manifestations: Secondary | ICD-10-CM | POA: Diagnosis not present

## 2015-10-03 DIAGNOSIS — J020 Streptococcal pharyngitis: Secondary | ICD-10-CM | POA: Diagnosis not present

## 2015-10-03 LAB — POCT MONO (EPSTEIN BARR VIRUS): Mono, POC: NEGATIVE

## 2015-10-03 NOTE — Addendum Note (Signed)
Addended by: Herminio Commons A on: 10/03/2015 04:47 PM   Modules accepted: Orders

## 2015-10-03 NOTE — Progress Notes (Signed)
   Subjective:    Patient ID: Jill Mathews, female    DOB: 08-03-1965, 50 y.o.   MRN: [id number]  HPI Chief Complaint  Patient presents with  . follow-up    doing better but still tired.    She is here for follow-up on recent illness. She was positive for flu and strep and treated with Bicillin injection. She had a rash that popped up a few hours after getting Bicillin injection, states it was from top of abdomen to hair line and down top of arms and upper back. States rash felt like sandpaper. States rash went away after 4 days.  She is now complaining of fatigue, mild headache, and sniffles.  States she is much better.  She is aware her blood sugar was elevated at her last visit, she states she had eaten a banana on the way to her appointment.  She denies fever, chills, bodyaches, sore throat, cough, abdominal pain, nausea, vomiting, diarrhea.   Review of Systems Pertinent positives and negatives in the history of present illness.     Objective:   Physical Exam BP 110/68 mmHg  Pulse 68  Temp(Src) 98 F (36.7 C) (Oral)  Resp 16  Wt 259 lb 6.4 oz (117.663 kg)  Alert and in no distress. Skin without rash or lesions.  Pharyngeal area is normal. Neck is supple without adenopathy or thyromegaly. Cardiac exam shows a regular sinus rhythm without murmurs or gallops. Lungs are clear to auscultation.   Mono spot: negative     Assessment & Plan:  Strep pharyngitis  Influenza  Discussed that her mono test was negative therefore the most likely explanation for the rash she developed was the Bicillin injection. She appears to be doing quite well today. Reviewed labs and chest x-ray with her. Suspect that elevated blood glucose was related to banana she ate prior. Recommend that she continue staying hydrated and may take tylenol as needed for headache.  She has appointment in May with Dr. Lynelle Doctor.

## 2015-10-09 ENCOUNTER — Other Ambulatory Visit: Payer: Self-pay | Admitting: Family Medicine

## 2015-11-01 ENCOUNTER — Telehealth: Payer: Self-pay | Admitting: *Deleted

## 2015-11-01 ENCOUNTER — Ambulatory Visit (INDEPENDENT_AMBULATORY_CARE_PROVIDER_SITE_OTHER): Payer: BC Managed Care – PPO | Admitting: Family Medicine

## 2015-11-01 ENCOUNTER — Encounter: Payer: Self-pay | Admitting: Family Medicine

## 2015-11-01 VITALS — BP 128/78 | HR 60 | Temp 97.1°F | Ht 67.25 in | Wt 251.4 lb

## 2015-11-01 DIAGNOSIS — E782 Mixed hyperlipidemia: Secondary | ICD-10-CM | POA: Diagnosis not present

## 2015-11-01 DIAGNOSIS — Z23 Encounter for immunization: Secondary | ICD-10-CM

## 2015-11-01 DIAGNOSIS — R7303 Prediabetes: Secondary | ICD-10-CM

## 2015-11-01 DIAGNOSIS — I10 Essential (primary) hypertension: Secondary | ICD-10-CM

## 2015-11-01 DIAGNOSIS — E669 Obesity, unspecified: Secondary | ICD-10-CM | POA: Diagnosis not present

## 2015-11-01 DIAGNOSIS — J029 Acute pharyngitis, unspecified: Secondary | ICD-10-CM | POA: Diagnosis not present

## 2015-11-01 DIAGNOSIS — E039 Hypothyroidism, unspecified: Secondary | ICD-10-CM | POA: Diagnosis not present

## 2015-11-01 DIAGNOSIS — M069 Rheumatoid arthritis, unspecified: Secondary | ICD-10-CM | POA: Diagnosis not present

## 2015-11-01 DIAGNOSIS — Z0000 Encounter for general adult medical examination without abnormal findings: Secondary | ICD-10-CM

## 2015-11-01 LAB — COMPREHENSIVE METABOLIC PANEL
ALBUMIN: 4 g/dL (ref 3.6–5.1)
ALK PHOS: 49 U/L (ref 33–130)
ALT: 24 U/L (ref 6–29)
AST: 17 U/L (ref 10–35)
BUN: 9 mg/dL (ref 7–25)
CALCIUM: 9.1 mg/dL (ref 8.6–10.4)
CO2: 27 mmol/L (ref 20–31)
Chloride: 104 mmol/L (ref 98–110)
Creat: 0.71 mg/dL (ref 0.50–1.05)
Glucose, Bld: 91 mg/dL (ref 65–99)
POTASSIUM: 3.9 mmol/L (ref 3.5–5.3)
Sodium: 141 mmol/L (ref 135–146)
TOTAL PROTEIN: 6.4 g/dL (ref 6.1–8.1)
Total Bilirubin: 0.5 mg/dL (ref 0.2–1.2)

## 2015-11-01 LAB — LIPID PANEL
Cholesterol: 168 mg/dL (ref 125–200)
HDL: 43 mg/dL — AB (ref >=46)
LDL Cholesterol: 103 mg/dL (ref <130)
TRIGLYCERIDES: 112 mg/dL (ref <150)
Total CHOL/HDL Ratio: 3.9 Ratio (ref <=5.0)
VLDL: 22 mg/dL (ref <30)

## 2015-11-01 LAB — POCT RAPID STREP A (OFFICE): RAPID STREP A SCREEN: NEGATIVE

## 2015-11-01 LAB — POCT GLYCOSYLATED HEMOGLOBIN (HGB A1C): HEMOGLOBIN A1C: 5.8

## 2015-11-01 LAB — TSH: TSH: 1.35 mIU/L

## 2015-11-01 MED ORDER — BENAZEPRIL HCL 10 MG PO TABS: 10.0000 mg | ORAL_TABLET | Freq: Every day | ORAL | Status: DC

## 2015-11-01 MED ORDER — HYDROCHLOROTHIAZIDE 25 MG PO TABS: 25.0000 mg | ORAL_TABLET | Freq: Every day | ORAL | Status: DC

## 2015-11-01 NOTE — Progress Notes (Signed)
Chief Complaint  Patient presents with  . Annual Exam    fasting annual exam, does not want to do pap. Did not do eye exam as she states that she sees Dr.Jones @ [facility] and she feels like he is thorough exam. Only concern is that she has sore throat-she had flu and strep end of April, concerned that she may have strep again and would like a swab today.    Jill Mathews is a 50 y.o. female who presents for a complete physical.  She has the following concerns:  She started with sore, scratchy throat and runny nose yesterday. No fever or chills. Nasal drainage is clear.  Denies cough. Requesting strep test.    She gained weight after her wife injured her back--had to be with her, didn't exercise.  Got up to 262# and has started losing some.  Hypertension follow-up: Blood pressures elsewhere are 120's/70's (at home). Denies dizziness, headaches, chest pain, edema. Denies side effects of medications, muscle cramps, cough. Leg cramps diminished after switching to 1/2 salt and 1/2 "no salt" (salt substitute with potassium)--uses this in her water with lemon juice and splenda for hiking/biking.   Hyperlipidemia:Last check was 10/2014 and was borderline. She never ended up seeing the nutritionist (was referred to [facility], didn't schedule). She continues to eat 1 egg daily, along with 2 whites (has chickens); eating more salads (1 Tbsp feta only), vinaigrette dressing, with salmon. Continues to eat red meat at least 3-4x/week (venison, pork), as well as chicken, but less red meat overall than last year.  Hypothyroidism: Compliant with taking Synthroid daily, on an empty stomach, separate from her vitamins. No hair/skin/bowel changes. Weight gain as reported above, for known reasons. Energy is good.  RA: Under the care of Dr. Estanislado Pandy, saw her last in March. She has had a couple of flares in the last year requiring prednisone course, typically 3-4/year, with season changes.  She gets CBC and  sugar (and possibly other tests) monitored through Dr. Arlean Hopping office.  Immunization History  Administered Date(s) Administered  . Influenza Split 03/10/2012  . Pneumococcal Polysaccharide-23 07/02/2006  . Tdap 09/09/2012   has rx for another pneumonia vaccine from Dr. Estanislado Pandy (doesn't have with her--unsure of which type is recommended).She never had it done last year, was given a new prescription recently, left rx in the car. Last Pap smear: Age 32--had a very bad experience and refuses; declines exam today. Previously recommended OB-GYN (Dr. Philis Pique), but never called. Last mammogram: never and refuses Last colonoscopy: never, but is willing to have (age 37 is what she states, unsure why) Last DEXA:never Dentist: every 6 months Ophtho: yearly (due to meds) Exercise:4.7 mile hikes with 17# pack 2-3 times/week (recently restarted, was on hold with wife's back injury). Treadmill at home 30 minutes if she can't get outside due to weather (about 2x/week).  Some weight lifting 2x/week. Walks 1 mile daily (with the dogs),  Normal vitamin D level (36) in 05/2012  Past Medical History  Diagnosis Date  . Rheumatoid arthritis(714.0) 04/2006    Dr. Estanislado Pandy manages  . Hypothyroidism 2001  . Hypertension   . Hyperlipidemia     borderline; dietary management    Past Surgical History  Procedure Laterality Date  . Pilonidal cyst excision  1983  . Tonsillectomy  age 15  . Wisdom tooth extraction      Social History   Social History  . Marital Status: Unknown    Spouse Name: N/A  . Number of Children:  N/A  . Years of Education: N/A   Occupational History  . teaches biology/ecology at [employer]  . Smoking status: Former Smoker -- 1.50 packs/day for 26 years    Types: Cigarettes    Start date: 01/08/2005  . Smokeless tobacco: Never Used  . Alcohol Use: Yes     Comment: 1-2 drinks (beer) per week.  . Drug Use: No  . Sexual  Activity:    Partners: Female   Other Topics Concern  . Not on file   Social History Narrative   Lives with female partner of 37 years--got married 06/11/13. 3 cats, 2 dogs, 10 chickens.      (had to slaughter chicken in order to isolate dog after incident with rabid fox getting into yard; one dog wasn't vaccinated--had to put down)    Family History  Problem Relation Age of Onset  . Heart disease Mother 35    MI, stents x 2  . Stroke Mother 49  . Hyperlipidemia Mother   . Hypertension Mother   . Depression Mother   . Diabetes Mother   . Thyroid disease Mother     Graves disease, s/p RAI, now hypothyroid  . Irritable bowel syndrome Mother   . COPD Mother   . Colon polyps Mother   . Cancer Mother     skin (not melanoma)  . Kidney Stones Mother   . Stroke Father 78  . Hyperlipidemia Father   . Hypertension Father   . Depression Father   . Diabetes Father   . Thyroid disease Father     hypothyroidism  . COPD Father   . Heart disease Father 81    MI; s/p CABG; pacemaker  . Cancer Sister 31    pancreatic cancer  . Bipolar disorder Sister   . Lupus Paternal Aunt     Outpatient Encounter Prescriptions as of 11/01/2015  Medication Sig Note  . aspirin 81 MG tablet Take 81 mg by mouth every other day.    . benazepril (LOTENSIN) 10 MG tablet Take 1 tablet (10 mg total) by mouth daily.   Marland Kitchen BLACK COHOSH PO Take 2 tablets by mouth daily.   Scarlette Shorts SURECLICK 50 MG/ML injection Inject 50 mg as directed once a week.  10/31/2014: .  Marland Kitchen FABB 2.2-25-1 MG TABS Take 1 tablet by mouth daily.    . fish oil-omega-3 fatty acids 1000 MG capsule Take 2 g by mouth daily.    . hydrochlorothiazide (HYDRODIURIL) 25 MG tablet Take 1 tablet (25 mg total) by mouth daily.   . methotrexate 25 MG/ML injection Inject 25 mg/m2 into the vein once. Reported on 10/03/2015 05/25/2012: 0.8 ml once weekly  . Nutritional Supplements (EQ ESTROBLEND MENOPAUSE PO) Take 1 tablet by mouth daily.   Marland Kitchen SYNTHROID 137 MCG  tablet TAKE 1 TABLET BY MOUTH DAILY   . [DISCONTINUED] methotrexate 250 MG/10ML injection INJECT 0.8 ML SUBCUTANEOUSLY Q WEEK 11/01/2015: Received from: External Pharmacy  . [DISCONTINUED] methotrexate 50 MG/2ML injection  11/01/2015: Received from: External Pharmacy  . [DISCONTINUED] Turmeric 500 MG CAPS Take 1 capsule by mouth 2 (two) times daily.    No facility-administered encounter medications on file as of 11/01/2015.    Allergies  Allergen Reactions  . Penicillins Rash    ROS: The patient denies anorexia, fever, headaches, vision changes, decreased hearing, ear pain, sore throat, breast concerns, chest pain, palpitations, dizziness, syncope, dyspnea on exertion, cough, swelling, nausea, vomiting, diarrhea, constipation, abdominal pain,  melena, hematochezia, indigestion/heartburn, hematuria, incontinence, dysuria, vaginal discharge, odor or itch, genital lesions, numbness, tingling, weakness, tremor, suspicious skin lesions, depression, anxiety, abnormal bleeding/bruising, or enlarged lymph nodes.  Last period was over 2 years ago. Mild hot flashes, tolerable with the supplements she takes. Arthritis pain at wrists, elbows, shoulders, knees--only intermittent. And also left 5th toe. Intermittent hoarseness-- has seen ENT, told inflammation of vocal cords, felt to be related to her RA by ENT; improves with prednisone--no longer chronic, just sporadic/intermittent--hasn't recurred in a while. Occasional insomnia (intermittent, for 3 days at a time). Denies any change to the mole on her nose (never saw derm)  PHYSICAL EXAM:  BP 128/78 mmHg  Pulse 60  Temp(Src) 97.1 F (36.2 C) (Tympanic)  Ht 5' 7.25" (1.708 m)  Wt 251 lb 6.4 oz (114.034 kg)  BMI 39.09 kg/m2   General Appearance:   Alert, cooperative, no distress, appears stated age  Head:   Normocephalic, without obvious abnormality, atraumatic  Eyes:   PERRL, conjunctiva/corneas clear, EOM's intact, fundi    benign  Ears:   Normal TM's and external ear canals  Nose:  Nares normal, mucosa is mildly edematous, some slight yellow crust; no erythema or sinus tenderness  Throat:  Lips, mucosa, and tongue normal; teeth and gums normal  Neck:  Supple, no lymphadenopathy; thyroid: no enlargement/tenderness/nodules; no carotid  bruit or JVD  Back:  Spine nontender, no curvature, ROM normal, no CVA tenderness  Lungs:   Clear to auscultation bilaterally without wheezes, rales or ronchi; respirations unlabored  Chest Wall:   No tenderness or deformity  Heart:   Regular rate and rhythm, S1 and S2 normal, no murmur, rub  or gallop  Breast Exam:   no nipple discharge, inversion, skin dimpling, breast masses or axillary adenopathy  Abdomen:   Soft, non-tender, nondistended, normoactive bowel sounds,   no masses, no hepatosplenomegaly  Genitalia:   Refused by patient     Extremities:  No clubbing, cyanosis or edema  Pulses:  2+ and symmetric all extremities  Skin:  Skin color, texture, turgor normal, no rashes. Bruises on lower legs (recent fall hiking, per pt).  Lymph nodes:  Cervical, supraclavicular, and axillary nodes normal  Neurologic:  CNII-XII intact, normal strength, sensation and gait; reflexes 2+ and symmetric throughout   Psych: Normal mood, affect, hygiene and grooming              Rapid strep negative Lab Results  Component Value Date   HGBA1C 5.8 11/01/2015     ASSESSMENT/PLAN:  Pre-diabetes - improved; continue regular exercise, weight loss, proper diet - Plan: HgB A1c  Sore throat - negative rapid strep; likely related to PND from URI - Plan: Rapid Strep A  Hypothyroidism, unspecified hypothyroidism type - euthyroid by history--check TSH - Plan: TSH  Essential hypertension, benign - well controlled - Plan: Comprehensive metabolic panel,  hydrochlorothiazide (HYDRODIURIL) 25 MG tablet, benazepril (LOTENSIN) 10 MG tablet  Rheumatoid arthritis involving multiple sites, unspecified rheumatoid factor presence (HCC) - stable  Obesity (BMI 30-39.9) - reviewed diet, exercise, encouraged weight loss  Mixed hyperlipidemia - reviewed low cholesterol diet.  recheck lipids - Plan: Lipid panel  Need for pneumococcal vaccine - due to high risk medications - Plan: Pneumococcal conjugate vaccine 13-valent   Lipid, TSH, c-met  Discussed monthly self breast exams and yearly mammograms; at least 30 minutes of aerobic activity at least 5 days/week, weight-bearing exercise at least 2x/week; proper sunscreen use reviewed; healthy diet, including goals of calcium and vitamin D  intake and alcohol recommendations (less than or equal to 1 drink/day) reviewed; regular seatbelt use; changing batteries in smoke detectors. Immunization recommendations discussed--she can bring her order for pneumonia vaccine from Dr. Estanislado Pandy and get here--she got rx from car, didn't specify which vaccine.  She previously had pneumovax, and is >50, so Prevnar given. Yearly flu shots are recommended.. Colonoscopy recommendations reviewed--Pt encouraged to call GI back and schedule. Briefly discussed Cologard--encouraged her to get colonoscopy (Cologard is not likely covered by insurance). Encouraged her to re-consider seeing GYN (or returning here) for pelvic exam. Counseled extensively regarding risks associated with delayed screening for breast cancer, female cancers, colon cancer.  Strongly encouraged her to reconsider pelvic/pap, and to call to schedule 3D mammogram and colonoscopy.

## 2015-11-01 NOTE — Patient Instructions (Addendum)
HEALTH MAINTENANCE RECOMMENDATIONS:  It is recommended that you get at least 30 minutes of aerobic exercise at least 5 days/week (for weight loss, you may need as much as 60-90 minutes). This can be any activity that gets your heart rate up. This can be divided in 10-15 minute intervals if needed, but try and build up your endurance at least once a week.  Weight bearing exercise is also recommended twice weekly.  Eat a healthy diet with lots of vegetables, fruits and fiber.  "Colorful" foods have a lot of vitamins (ie green vegetables, tomatoes, red peppers, etc).  Limit sweet tea, regular sodas and alcoholic beverages, all of which has a lot of calories and sugar.  Up to 1 alcoholic drink daily may be beneficial for women (unless trying to lose weight, watch sugars).  Drink a lot of water.  Calcium recommendations are 1200-1500 mg daily (1500 mg for postmenopausal women or women without ovaries), and vitamin D 1000 IU daily.  This should be obtained from diet and/or supplements (vitamins), and calcium should not be taken all at once, but in divided doses.  Monthly self breast exams and yearly mammograms for women over the age of 31 is recommended.  Sunscreen of at least SPF 30 should be used on all sun-exposed parts of the skin when outside between the hours of 10 am and 4 pm (not just when at beach or pool, but even with exercise, golf, tennis, and yard work!)  Use a sunscreen that says "broad spectrum" so it covers both UVA and UVB rays, and make sure to reapply every 1-2 hours.  Remember to change the batteries in your smoke detectors when changing your clock times in the spring and fall.  Use your seat belt every time you are in a car, and please drive safely and not be distracted with cell phones and texting while driving.  Please consider seeing Dr. Henderson Cloud at [facility] for pelvic exam. Please call [facility] back to schedule routine colonoscopy (let us know if we need to actually  put in a new referral). [facility] and schedule a mammogram (3-D is preferred, if possible).   Insomnia Insomnia is a sleep disorder that makes it difficult to fall asleep or to stay asleep. Insomnia can cause tiredness (fatigue), low energy, difficulty concentrating, mood swings, and poor performance at work or school.  There are three different ways to classify insomnia:  Difficulty falling asleep.  Difficulty staying asleep.  Waking up too early in the morning. Any type of insomnia can be long-term (chronic) or short-term (acute). Both are common. Short-term insomnia usually lasts for three months or less. Chronic insomnia occurs at least three times a week for longer than three months. CAUSES  Insomnia may be caused by another condition, situation, or substance, such as:  Anxiety.  Certain medicines.  Gastroesophageal reflux disease (GERD) or other gastrointestinal conditions.  Asthma or other breathing conditions.  Restless legs syndrome, sleep apnea, or other sleep disorders.  Chronic pain.  Menopause. This may include hot flashes.  Stroke.  Abuse of alcohol, tobacco, or illegal drugs.  Depression.  Caffeine.   Neurological disorders, such as Alzheimer disease.  An overactive thyroid (hyperthyroidism). The cause of insomnia may not be known. RISK FACTORS Risk factors for insomnia include:  Gender. Women are more commonly affected than men.  Age. Insomnia is more common as you get older.  Stress. This may involve your professional or personal life.  Income. Insomnia is  more common in people with lower income.  Lack of exercise.   Irregular work schedule or night shifts.  Traveling between different time zones. SIGNS AND SYMPTOMS If you have insomnia, trouble falling asleep or trouble staying asleep is the main symptom. This may lead to other symptoms, such as:  Feeling fatigued.  Feeling nervous about going to sleep.  Not  feeling rested in the morning.  Having trouble concentrating.  Feeling irritable, anxious, or depressed. TREATMENT  Treatment for insomnia depends on the cause. If your insomnia is caused by an underlying condition, treatment will focus on addressing the condition. Treatment may also include:   Medicines to help you sleep.  Counseling or therapy.  Lifestyle adjustments. HOME CARE INSTRUCTIONS   Take medicines only as directed by your health care provider.  Keep regular sleeping and waking hours. Avoid naps.  Keep a sleep diary to help you and your health care provider figure out what could be causing your insomnia. Include:   When you sleep.  When you wake up during the night.  How well you sleep.   How rested you feel the next day.  Any side effects of medicines you are taking.  What you eat and drink.   Make your bedroom a comfortable place where it is easy to fall asleep:  Put up shades or special blackout curtains to block light from outside.  Use a white noise machine to block noise.  Keep the temperature cool.   Exercise regularly as directed by your health care provider. Avoid exercising right before bedtime.  Use relaxation techniques to manage stress. Ask your health care provider to suggest some techniques that may work well for you. These may include:  Breathing exercises.  Routines to release muscle tension.  Visualizing peaceful scenes.  Cut back on alcohol, caffeinated beverages, and cigarettes, especially close to bedtime. These can disrupt your sleep.  Do not overeat or eat spicy foods right before bedtime. This can lead to digestive discomfort that can make it hard for you to sleep.  Limit screen use before bedtime. This includes:  Watching TV.  Using your smartphone, tablet, and computer.  Stick to a routine. This can help you fall asleep faster. Try to do a quiet activity, brush your teeth, and go to bed at the same time each  night.  Get out of bed if you are still awake after 15 minutes of trying to sleep. Keep the lights down, but try reading or doing a quiet activity. When you feel sleepy, go back to bed.  Make sure that you drive carefully. Avoid driving if you feel very sleepy.  Keep all follow-up appointments as directed by your health care provider. This is important. SEEK MEDICAL CARE IF:   You are tired throughout the day or have trouble in your daily routine due to sleepiness.  You continue to have sleep problems or your sleep problems get worse. SEEK IMMEDIATE MEDICAL CARE IF:   You have serious thoughts about hurting yourself or someone else.   This information is not intended to replace advice given to you by your health care provider. Make sure you discuss any questions you have with your health care provider.   Document Released: 05/24/2000 Document Revised: 02/15/2015 Document Reviewed: 02/25/2014 Elsevier Interactive Patient Education [employer]

## 2015-11-01 NOTE — Telephone Encounter (Signed)
Dr.Deveshwar's office called back and said that Dr. Corliss Skains said it is up to PCP to decide which pneumonia vaccine is appropriate.

## 2015-11-01 NOTE — Telephone Encounter (Signed)
prevnar-13 since age >66 and she has had a pneumovax once already

## 2015-11-02 LAB — POCT URINALYSIS DIPSTICK
BILIRUBIN UA: NEGATIVE
GLUCOSE UA: NEGATIVE
Ketones, UA: NEGATIVE
Leukocytes, UA: NEGATIVE
NITRITE UA: NEGATIVE
PH UA: 7.5
Protein, UA: NEGATIVE
RBC UA: NEGATIVE
SPEC GRAV UA: 1.015
UROBILINOGEN UA: NEGATIVE

## 2015-11-02 NOTE — Addendum Note (Signed)
Addended by: Debbrah Alar F on: 11/02/2015 08:15 AM   Modules accepted: Orders

## 2015-11-10 ENCOUNTER — Other Ambulatory Visit: Payer: Self-pay | Admitting: Family Medicine

## 2016-01-09 ENCOUNTER — Other Ambulatory Visit: Payer: Self-pay | Admitting: Family Medicine

## 2016-01-09 ENCOUNTER — Other Ambulatory Visit: Payer: Self-pay

## 2016-01-09 DIAGNOSIS — I10 Essential (primary) hypertension: Secondary | ICD-10-CM

## 2016-01-09 MED ORDER — SYNTHROID 137 MCG PO TABS: 137.0000 ug | ORAL_TABLET | Freq: Every day | ORAL | 2 refills | Status: DC

## 2016-04-22 ENCOUNTER — Other Ambulatory Visit: Payer: Self-pay | Admitting: Rheumatology

## 2016-04-22 DIAGNOSIS — Z79899 Other long term (current) drug therapy: Secondary | ICD-10-CM

## 2016-04-22 DIAGNOSIS — Z9225 Personal history of immunosupression therapy: Secondary | ICD-10-CM

## 2016-04-22 LAB — CBC WITH DIFFERENTIAL/PLATELET
BASOS ABS: 0 {cells}/uL (ref 0–200)
BASOS PCT: 0 %
EOS ABS: 192 {cells}/uL (ref 15–500)
Eosinophils Relative: 3 %
HCT: 41.6 % (ref 35.0–45.0)
HEMOGLOBIN: 13.7 g/dL (ref 11.7–15.5)
LYMPHS ABS: 1664 {cells}/uL (ref 850–3900)
Lymphocytes Relative: 26 %
MCH: 31.4 pg (ref 27.0–33.0)
MCHC: 32.9 g/dL (ref 32.0–36.0)
MCV: 95.2 fL (ref 80.0–100.0)
MONOS PCT: 6 %
MPV: 10.3 fL (ref 7.5–12.5)
Monocytes Absolute: 384 cells/uL (ref 200–950)
NEUTROS ABS: 4160 {cells}/uL (ref 1500–7800)
Neutrophils Relative %: 65 %
PLATELETS: 307 10*3/uL (ref 140–400)
RBC: 4.37 MIL/uL (ref 3.80–5.10)
RDW: 13.8 % (ref 11.0–15.0)
WBC: 6.4 10*3/uL (ref 3.8–10.8)

## 2016-04-22 LAB — COMPLETE METABOLIC PANEL WITH GFR
ALBUMIN: 4.1 g/dL (ref 3.6–5.1)
ALK PHOS: 55 U/L (ref 33–130)
ALT: 23 U/L (ref 6–29)
AST: 18 U/L (ref 10–35)
BILIRUBIN TOTAL: 0.4 mg/dL (ref 0.2–1.2)
BUN: 21 mg/dL (ref 7–25)
CO2: 30 mmol/L (ref 20–31)
CREATININE: 0.95 mg/dL (ref 0.50–1.05)
Calcium: 9.4 mg/dL (ref 8.6–10.4)
Chloride: 106 mmol/L (ref 98–110)
GFR, EST AFRICAN AMERICAN: 80 mL/min (ref >=60)
GFR, Est Non African American: 69 mL/min (ref >=60)
GLUCOSE: 154 mg/dL — AB (ref 65–99)
Potassium: 4.6 mmol/L (ref 3.5–5.3)
SODIUM: 142 mmol/L (ref 135–146)
TOTAL PROTEIN: 6.5 g/dL (ref 6.1–8.1)

## 2016-04-24 LAB — QUANTIFERON TB GOLD ASSAY (BLOOD)
Interferon Gamma Release Assay: NEGATIVE
Mitogen-Nil: 3.62 IU/mL
Quantiferon Nil Value: 0.54 IU/mL
Quantiferon Tb Ag Minus Nil Value: 0.18 IU/mL

## 2016-04-24 NOTE — Progress Notes (Signed)
Labs normal, Glu is high

## 2016-04-29 ENCOUNTER — Telehealth: Payer: Self-pay | Admitting: Rheumatology

## 2016-04-29 NOTE — Telephone Encounter (Signed)
Patient needs a refill of Enbrel sent to [facility]; her labs are back.

## 2016-04-29 NOTE — Telephone Encounter (Signed)
Ok to refill Enbrel  this one time to [facility]; but must keep dec 2017 appt.

## 2016-04-29 NOTE — Telephone Encounter (Signed)
08/15/15 last visit Patient very past due for follow up, she has made appt for 05/22/16 She did go for labs this month normal CBC CMP neg TB gold Ok to refill Enbrel ?

## 2016-04-29 NOTE — Telephone Encounter (Signed)
Please send to [facility] in [place].

## 2016-05-06 MED ORDER — ETANERCEPT 50 MG/ML ~~LOC~~ SOAJ: 50.0000 mg | SUBCUTANEOUS | 0 refills | Status: DC

## 2016-05-06 NOTE — Telephone Encounter (Signed)
Samples of Enbrel were given to the patient, quantity1, Lot Number [id number]

## 2016-05-06 NOTE — Telephone Encounter (Signed)
Ok to give her enbrel refill and sample till her dec appt.  Must keep dec appt.

## 2016-05-06 NOTE — Telephone Encounter (Signed)
This message was signed, not routed to me. Patient walked in today requesting a sample  Ok to provide her a sample of Enbrel to her ?  Last visit was in March  She has appt in December.

## 2016-05-20 NOTE — Progress Notes (Signed)
Office Visit Note  [name]             Date of Birth: [birth year]           MRN: [id number]             PCP: Vikki Ports, MD Referring: Rita Ohara, MD Visit Date: 05/22/2016 Occupation: '@GUAROCC' @    Subjective:  No chief complaint on file. Follow-up on rheumatoid arthritis and high-risk prescription   History of Present Illness: Jill Mathews is a 50 y.o. female  Last seen 08/15/2015 Patient is doing well with her rheumatoid arthritis except she had a flare 04/19/2016. At that time she had a small dose of prednisone at home which she used as follows. Prednisone 5 mg: 15 mg for 2 days; 10 mg for 2 days; 5 mg for 2 days; 2.5 mg for 2 days; stop. Patient did really well after the first couple of days of the medication and completed a taper.  She is traveling to [place] and is requesting a printed prescription for prednisone in case she has another flare. Note the patient has done well with her rheumatoid arthritis in the past and does not generally have flares. I've asked the patient to call us and let us know when she uses her prednisone as she is having a flare so we can document how many flare she is actually having. We want to confirm that the Enbrel and methotrexate dosage is appropriately taking care of her rheumatoid arthritis.  She states the flare happened because of increased stress change of weather and increase activity.   Activities of Daily Living:  Patient reports morning stiffness for 15 minutes.   Patient Denies nocturnal pain.  Difficulty dressing/grooming: Denies Difficulty climbing stairs: Denies Difficulty getting out of chair: Denies Difficulty using hands for taps, buttons, cutlery, and/or writing: Denies   Review of Systems  Constitutional: Negative for fatigue.  HENT: Negative for mouth sores and mouth dryness.   Eyes: Negative for dryness.  Respiratory: Negative for shortness of breath.   Gastrointestinal: Negative for  constipation and diarrhea.  Musculoskeletal: Negative for myalgias and myalgias.  Skin: Negative for sensitivity to sunlight.  Psychiatric/Behavioral: Negative for decreased concentration and sleep disturbance.    PMFS History:  Patient Active Problem List   Diagnosis Date Noted  . Influenza 09/25/2015  . Strep pharyngitis 09/25/2015  . Prediabetes 10/31/2014  . Mixed hyperlipidemia 09/09/2012  . Hypothyroidism 05/25/2012  . Essential hypertension, benign 05/25/2012  . Rheumatoid arthritis ([place]) 05/25/2012  . Obesity (BMI 30-39.9) 05/25/2012    Past Medical History:  Diagnosis Date  . Hyperlipidemia    borderline; dietary management  . Hypertension   . Hypothyroidism 2001  . Rheumatoid arthritis(714.0) 04/2006   Dr. Estanislado Pandy manages    Family History  Problem Relation Age of Onset  . Heart disease Mother 56    MI, stents x 2  . Stroke Mother 74  . Hyperlipidemia Mother   . Hypertension Mother   . Depression Mother   . Diabetes Mother   . Thyroid disease Mother     Graves disease, s/p RAI, now hypothyroid  . Irritable bowel syndrome Mother   . COPD Mother   . Colon polyps Mother   . Cancer Mother     skin (not melanoma)  . Kidney Stones Mother   . Stroke Father 56  . Hyperlipidemia Father   . Hypertension Father   . Depression Father   . Diabetes Father   . Thyroid disease  Father     hypothyroidism  . COPD Father   . Heart disease Father 32    MI; s/p CABG; pacemaker  . Cancer Sister 40    pancreatic cancer  . Bipolar disorder Sister   . Lupus Paternal Aunt    Past Surgical History:  Procedure Laterality Date  . PILONIDAL CYST EXCISION  1983  . TONSILLECTOMY  age 5  . WISDOM TOOTH EXTRACTION     Social History   Social History Narrative   Lives with female partner of 28 years--got married 06/11/13. 3 cats, 2 dogs, 10 chickens.      (had to slaughter chicken in order to isolate dog after incident with rabid fox getting into yard; one dog wasn't  vaccinated--had to put down)     Objective: Vital Signs: BP 120/80 (BP Location: Left Arm, Patient Position: Sitting, Cuff Size: Large)   Pulse 61   Resp 14   Ht '5\' 7"'  (1.702 m)   Wt 255 lb (115.7 kg)   LMP 01/13/2013   BMI 39.94 kg/m    Physical Exam  Constitutional: She is oriented to person, place, and time. She appears well-developed and well-nourished.  HENT:  Head: Normocephalic and atraumatic.  Eyes: EOM are normal. Pupils are equal, round, and reactive to light.  Cardiovascular: Normal rate, regular rhythm and normal heart sounds.  Exam reveals no gallop and no friction rub.   No murmur heard. Pulmonary/Chest: Effort normal and breath sounds normal. She has no wheezes. She has no rales.  Abdominal: Soft. Bowel sounds are normal. She exhibits no distension. There is no tenderness. There is no guarding. No hernia.  Musculoskeletal: Normal range of motion. She exhibits no edema, tenderness or deformity.  Lymphadenopathy:    She has no cervical adenopathy.  Neurological: She is alert and oriented to person, place, and time. Coordination normal.  Skin: Skin is warm and dry. Capillary refill takes less than 2 seconds. Rash (pitting of nails (pt bites nails)) noted.     Psychiatric: She has a normal mood and affect. Her behavior is normal.  Nursing note and vitals reviewed.   GANGLION CYST TO BILATERAL WRIST JOINT (RIGHT MORE PAINFUL THAN LEFT WRIST JOINT).  Musculoskeletal Exam:  Full range of motion of all joints Grip strength is equal and strong bilaterally Fiber myalgia tender points are all absent  CDAI Exam: CDAI Homunculus Exam:   Joint Counts:  CDAI Tender Joint count: 0 CDAI Swollen Joint count: 0  Global Assessments:  Patient Global Assessment: 0 Provider Global Assessment: 0    Investigation: Findings:  04/16/2006 negative screening hepatitis panel Rheumatoid arthritis with positive rheumatoid factor, positive CCP, positive HLA-B27.    No visits  with results within 1 Month(s) from this visit.  Latest known visit with results is:  Lab on 04/22/2016  Component Date Value Ref Range Status  . WBC 04/22/2016 6.4  3.8 - 10.8 K/uL Final  . RBC 04/22/2016 4.37  3.80 - 5.10 MIL/uL Final  . Hemoglobin 04/22/2016 13.7  11.7 - 15.5 g/dL Final  . HCT 04/22/2016 41.6  35.0 - 45.0 % Final  . MCV 04/22/2016 95.2  80.0 - 100.0 fL Final  . MCH 04/22/2016 31.4  27.0 - 33.0 pg Final  . MCHC 04/22/2016 32.9  32.0 - 36.0 g/dL Final  . RDW 04/22/2016 13.8  11.0 - 15.0 % Final  . Platelets 04/22/2016 307  140 - 400 K/uL Final  . MPV 04/22/2016 10.3  7.5 - 12.5 fL Final  .  Neutro Abs 04/22/2016 4160  1,500 - 7,800 cells/uL Final  . Lymphs Abs 04/22/2016 1664  850 - 3,900 cells/uL Final  . Monocytes Absolute 04/22/2016 384  200 - 950 cells/uL Final  . Eosinophils Absolute 04/22/2016 192  15 - 500 cells/uL Final  . Basophils Absolute 04/22/2016 0  0 - 200 cells/uL Final  . Neutrophils Relative % 04/22/2016 65  % Final  . Lymphocytes Relative 04/22/2016 26  % Final  . Monocytes Relative 04/22/2016 6  % Final  . Eosinophils Relative 04/22/2016 3  % Final  . Basophils Relative 04/22/2016 0  % Final  . Smear Review 04/22/2016 Criteria for review not met   Final  . Sodium 04/22/2016 142  135 - 146 mmol/L Final  . Potassium 04/22/2016 4.6  3.5 - 5.3 mmol/L Final  . Chloride 04/22/2016 106  98 - 110 mmol/L Final  . CO2 04/22/2016 30  20 - 31 mmol/L Final  . Glucose, Bld 04/22/2016 154* 65 - 99 mg/dL Final  . BUN 04/22/2016 21  7 - 25 mg/dL Final  . Creat 04/22/2016 0.95  0.50 - 1.05 mg/dL Final   Comment:   For patients > or = 52 years of age: The upper reference limit for Creatinine is approximately 13% higher for people identified as African-American.     . Total Bilirubin 04/22/2016 0.4  0.2 - 1.2 mg/dL Final  . Alkaline Phosphatase 04/22/2016 55  33 - 130 U/L Final  . AST 04/22/2016 18  10 - 35 U/L Final  . ALT 04/22/2016 23  6 - 29 U/L Final  .  Total Protein 04/22/2016 6.5  6.1 - 8.1 g/dL Final  . Albumin 04/22/2016 4.1  3.6 - 5.1 g/dL Final  . Calcium 04/22/2016 9.4  8.6 - 10.4 mg/dL Final  . GFR, Est African American 04/22/2016 80  >=60 mL/min Final  . GFR, Est Non African American 04/22/2016 69  >=60 mL/min Final  . Interferon Gamma Release Assay 04/24/2016 NEGATIVE  NEGATIVE Final  . Quantiferon Nil Value 04/24/2016 0.54  IU/mL Final  . Mitogen-Nil 04/24/2016 3.62  IU/mL Final  . Quantiferon Tb Ag Minus Nil Value 04/24/2016 0.18  IU/mL Final   Comment:   The Nil tube value is used to determine if the patient has a preexisting immune response which could cause a false-positive reading on the test. In order for a test to be valid, the Nil tube must have a value of less than or equal to 8.0 IU/mL.   The mitogen control tube is used to assure the patient has a healthy immune status and also serves as a control for correct blood handling and incubation. It is used to detect false-negative readings. The mitogen tube must have a gamma interferon value of greater than or equal to 0.5 IU/mL higher than the value of the Nil tube.   The TB antigen tube is coated with the M. tuberculosis specific antigens. For a test to be considered positive, the TB antigen tube value minus the Nil tube value must be greater than or equal to 0.35 IU/mL.   For additional information, please refer to http://education.quest[facility].com/faq/QFT (This link is being provided for informational/educational purposes only.)      Imaging: No results found.      Speciality Comments: No specialty comments available.    Procedures:  No procedures performed Allergies: Penicillins   Assessment / Plan:     Visit Diagnoses: Rheumatoid arthritis involving multiple sites with positive rheumatoid factor (HCC) - +RF +CCP +  HLAB27  HLA B27 (HLA B27 positive)  High risk medication use - Enbrel and Methotrexate  Hypothyroidism, unspecified  type  Essential hypertension, benign  Patient CBC with differential CMP with GFR done 04/22/2016 are within normal limits. I refilled patient's methotrexate at 20 mg every week to be injected subcutaneously. I'll give her 90 day supply with no refills. I refilled her Enbrel sure click 50 mg every week. She'll get a 90 day supply with no refills. Both the methotrexate and Enbrel will come for mail-order  Patient would like to have a written prescription for prednisone taper. I have done so. Please see below for full details.  She will see her PCP who will refer her to hand surgeon for evaluation and treatment of ganglion cyst of bilateral wrist  Orders: No orders of the defined types were placed in this encounter.  Meds ordered this encounter  Medications  . predniSONE (DELTASONE) 5 MG tablet    Sig: OK to Prescribe Prednisone 46m: 3po qAM x 2 days, 2po qAM x 2 days, 1po qAM x 2 days, 1/2 po qAM x 2 days; then stop. disp 20 pills w/ no refills.    Dispense:  13 tablet    Refill:  0    Order Specific Question:   Supervising Provider    Answer:   DBo Merino[[id number]]  . DISCONTD: methotrexate 50 MG/2ML injection    Sig: Inject 0.8 mLs (20 mg total) into the vein once.    Dispense:  10 mL    Refill:  0    261mvials with preservatives x 5 vials to equal 90 day supply (ie: 1052m    Order Specific Question:   Supervising Provider    Answer:   DEVLyda Perone etanercept (ENBREL SURECLICK) 50 MG/ML injection    Sig: Inject 0.98 mLs (50 mg total) into the skin once a week.    Dispense:  11.76 mL    Refill:  0    Order Specific Question:   Supervising Provider    Answer:   DEVBo [id number]]  . methotrexate 50 MG/2ML injection    Sig: Inject 0.8 mLs (20 mg total) into the skin once. Inject 0.8 ML's (20 mg total) subcutaneously once a week    Dispense:  10 mL    Refill:  0    2ml17mals with preservatives x 5 vials to equal 90 day supply (ie: 10ml73m  Order  Specific Question:   Supervising Provider    Answer:   DEVESLyda Peroneace-to-face time spent with patient was 30 minutes. 50% of time was spent in counseling and coordination of care.  Follow-Up Instructions: Return in about 5 months (around 10/20/2016) for RA,enbrel,mtx0.8,folic2mg; 29m NaitikEliezer Lofts   I examined and evaluated the patient with NaitikEliezer Loftshe plan of care was discussed as noted above.  ShailiBo Merino

## 2016-05-22 ENCOUNTER — Ambulatory Visit: Payer: BC Managed Care – PPO | Admitting: Rheumatology

## 2016-05-22 ENCOUNTER — Ambulatory Visit (INDEPENDENT_AMBULATORY_CARE_PROVIDER_SITE_OTHER): Payer: BC Managed Care – PPO | Admitting: Rheumatology

## 2016-05-22 ENCOUNTER — Encounter: Payer: Self-pay | Admitting: Rheumatology

## 2016-05-22 VITALS — BP 120/80 | HR 61 | Resp 14 | Ht 67.0 in | Wt 255.0 lb

## 2016-05-22 DIAGNOSIS — E039 Hypothyroidism, unspecified: Secondary | ICD-10-CM

## 2016-05-22 DIAGNOSIS — M67432 Ganglion, left wrist: Secondary | ICD-10-CM | POA: Diagnosis not present

## 2016-05-22 DIAGNOSIS — Z1589 Genetic susceptibility to other disease: Secondary | ICD-10-CM

## 2016-05-22 DIAGNOSIS — Z79899 Other long term (current) drug therapy: Secondary | ICD-10-CM | POA: Diagnosis not present

## 2016-05-22 DIAGNOSIS — M67431 Ganglion, right wrist: Secondary | ICD-10-CM | POA: Diagnosis not present

## 2016-05-22 DIAGNOSIS — I10 Essential (primary) hypertension: Secondary | ICD-10-CM

## 2016-05-22 DIAGNOSIS — M0579 Rheumatoid arthritis with rheumatoid factor of multiple sites without organ or systems involvement: Secondary | ICD-10-CM

## 2016-05-22 MED ORDER — PREDNISONE 5 MG PO TABS: ORAL_TABLET | ORAL | 0 refills | Status: DC

## 2016-05-22 MED ORDER — ETANERCEPT 50 MG/ML ~~LOC~~ SOAJ: 50.0000 mg | SUBCUTANEOUS | 0 refills | Status: DC

## 2016-05-22 MED ORDER — METHOTREXATE SODIUM CHEMO INJECTION 50 MG/2ML: 20.0000 mg | Freq: Once | INTRAMUSCULAR | 0 refills | Status: DC

## 2016-07-15 ENCOUNTER — Other Ambulatory Visit: Payer: Self-pay | Admitting: Rheumatology

## 2016-07-15 NOTE — Telephone Encounter (Signed)
FABB TAB is also known as TL GARD

## 2016-07-15 NOTE — Telephone Encounter (Signed)
Last Visit: 05/22/16 Next Visit: 11/05/16 Labs: 04/22/16 WNL  Okay to refill FABB?

## 2016-08-09 ENCOUNTER — Telehealth: Payer: Self-pay | Admitting: Rheumatology

## 2016-08-09 NOTE — Telephone Encounter (Signed)
Left message for patient to call the office

## 2016-08-09 NOTE — Telephone Encounter (Signed)
Patient is requesting Prednisone refill to be sent to the [facility] pharmacy on [address].

## 2016-08-13 NOTE — Telephone Encounter (Signed)
Ok to give ==> OK to Prescribe Prednisone 5mg : 3po qAM x 2 days, 2po qAM x 2 days, 1po qAM x 2 days, 1/2 po qAM x 2 days; then stop. disp 20 pills w/ no refills.     Dispense:  13 tablet   Refill:  0  OK to Prescribe Prednisone 5mg : 3po qAM x 2 days, 2po qAM x 2 days, 1po qAM x 2 days, 1/2 po qAM x 2 days; then stop. disp 20 pills w/ no refills.     Dispense:  13 tablet   Refill:  0

## 2016-08-13 NOTE — Telephone Encounter (Signed)
Patient states she is having a flare and has missed a day and a half of work and is requesting a refill on her prednisone taper she has previously had.

## 2016-08-13 NOTE — Telephone Encounter (Signed)
Patient left message yesterday on voice mail states she is at work wants you to call her back.

## 2016-08-14 MED ORDER — PREDNISONE 5 MG PO TABS: ORAL_TABLET | ORAL | 0 refills | Status: DC

## 2016-08-14 NOTE — Telephone Encounter (Signed)
Prescription sent to the pharmacy and left message to advise patient  

## 2016-08-16 ENCOUNTER — Encounter: Payer: Self-pay | Admitting: Medical

## 2016-08-16 ENCOUNTER — Ambulatory Visit (INDEPENDENT_AMBULATORY_CARE_PROVIDER_SITE_OTHER): Payer: BC Managed Care – PPO | Admitting: Medical

## 2016-08-16 VITALS — BP 140/80 | HR 60 | Temp 98.2°F | Wt 262.2 lb

## 2016-08-16 DIAGNOSIS — L237 Allergic contact dermatitis due to plants, except food: Secondary | ICD-10-CM

## 2016-08-16 MED ORDER — METHYLPREDNISOLONE ACETATE 80 MG/ML IJ SUSP
80.0000 mg | Freq: Once | INTRAMUSCULAR | Status: AC
  Administered 2016-08-16: 80 mg via INTRAMUSCULAR

## 2016-08-16 NOTE — Progress Notes (Signed)
Subjective:   Jill Mathews is a 51 y.o. female who presents for evaluation of a rash involving the right arm.  Had fell in some ivy, pretty sure it was poison ivy.   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  Thinks her bow had touched the poison ivy.  She apparently touched her face, and now has rash mostly on cheeks, chin, orbits, itchy puffy and pink/red.   Using OTC cortaid with no relief.  No other rash.   Several of her bow shooting friends also got poison ivy rash. She has always reacted to poison ivy.  She has hx/o RA, on methotrexate and Enbrel, but been on these for years. No other new medication changes.  No other aggravating or relieving factors. No other complaint.  The following portions of the patient's history were reviewed and updated as appropriate: allergies, current medications, past family history, past medical history, past social history and problem list.  Review of Systems As in subjective above   Objective:   Gen: wd, wn, nad Skin: bilat cheeks with mild urticarial rash, pink, bilat orbits pink and puffy with mild urticarial rash including eyelids, chins with similar appearance.   No other skin changes No dyspnea, no angioedema    Assessment:   Encounter Diagnosis  Name Primary?  . Poison ivy dermatitis Yes     Plan:   Discussed symptoms and exam findings.  Etiology most likely poison ivy dermatitis.   Begin Claritin in the morning, benadryl QHS the next few days. Gave IM depo medrol 80mg  in office.  Can c/t OTC Cortaid short term on face limited.   Avoid re-exposure.  discussed cleaning contaminated objects.  F/u prn.

## 2016-08-20 ENCOUNTER — Encounter: Payer: Self-pay | Admitting: Family Medicine

## 2016-08-20 ENCOUNTER — Ambulatory Visit (INDEPENDENT_AMBULATORY_CARE_PROVIDER_SITE_OTHER): Payer: BC Managed Care – PPO | Admitting: Family Medicine

## 2016-08-20 VITALS — BP 110/70 | HR 68 | Temp 98.3°F

## 2016-08-20 DIAGNOSIS — R52 Pain, unspecified: Secondary | ICD-10-CM

## 2016-08-20 DIAGNOSIS — R6889 Other general symptoms and signs: Secondary | ICD-10-CM

## 2016-08-20 DIAGNOSIS — J101 Influenza due to other identified influenza virus with other respiratory manifestations: Secondary | ICD-10-CM | POA: Diagnosis not present

## 2016-08-20 LAB — POC INFLUENZA A&B (BINAX/QUICKVUE)
INFLUENZA B, POC: NEGATIVE
Influenza A, POC: POSITIVE — AB

## 2016-08-20 MED ORDER — ONDANSETRON 4 MG PO TBDP: 4.0000 mg | ORAL_TABLET | Freq: Three times a day (TID) | ORAL | 0 refills | Status: DC | PRN

## 2016-08-20 MED ORDER — OSELTAMIVIR PHOSPHATE 75 MG PO CAPS: 75.0000 mg | ORAL_CAPSULE | Freq: Two times a day (BID) | ORAL | 0 refills | Status: DC

## 2016-08-20 NOTE — Progress Notes (Signed)
   Subjective:    Patient ID: Jill Mathews, female    DOB: 15-Mar-1966, 51 y.o.   MRN: [id number]  HPI Chief Complaint  Patient presents with  . flu    symptoms started this morning. fever, nausea, body aches, headache   She is here with complaints of a 6-7 hour history of feeling feverish, chills, fatigue, nausea, and body aches.   Denies ear pain, dizziness, rhinorrhea, nasal congestion, sore throat, coughing, chest pain, palpitations, shortness of breath, abdominal pain, vomiting or diarrhea.   She did get a flu shot. Denies sick contacts.   States she had a steroid injection for poison ivy last week and 2 weeks for a RA flare.  No recent antibiotics.    Review of Systems Pertinent positives and negatives in the history of present illness.     Objective:   Physical Exam BP 110/70   Pulse 68   Temp 98.3 F (36.8 C) (Oral)   LMP 01/13/2013   Alert and in no distress. No sinus tenderness. Tympanic membranes and canals are normal. Pharyngeal area is normal. Neck is supple without adenopathy or thyromegaly. Cardiac exam shows a regular sinus rhythm without murmurs or gallops. Lungs are clear to auscultation. Abdomen soft, non distended, normal BS, non tender, no hepatosplenomegaly. Extremities without edema, normal pulses.       Assessment & Plan:  Influenza A  Body aches - Plan: POC Influenza A&B(BINAX/QUICKVUE)  Flu-like symptoms  She is positive for influenza A. Discussed supportive care with fluids, tylenol or ibuprofen, rest. Tamiflu sent to pharmacy. Zofran ODT sent to pharmacy for nausea. Advised patient of potential complications of influenza and side effects of Tamiflu. She will call or return if she gets worse or is not back to baseline after completing medication.

## 2016-08-20 NOTE — Patient Instructions (Signed)

## 2016-08-22 ENCOUNTER — Other Ambulatory Visit: Payer: Self-pay | Admitting: Rheumatology

## 2016-08-22 NOTE — Telephone Encounter (Signed)
Last Visit: 05/22/16 Next Visit: 11/05/16 Labs: 04/22/16 WNL TB Gold: 04/2016 Neg  Patient will update labs next week.   Okay to refill Enbrel and MTX?

## 2016-09-18 ENCOUNTER — Other Ambulatory Visit: Payer: Self-pay | Admitting: *Deleted

## 2016-09-18 DIAGNOSIS — Z79899 Other long term (current) drug therapy: Secondary | ICD-10-CM

## 2016-09-18 LAB — COMPLETE METABOLIC PANEL WITH GFR
ALT: 20 U/L (ref 6–29)
AST: 16 U/L (ref 10–35)
Albumin: 3.9 g/dL (ref 3.6–5.1)
Alkaline Phosphatase: 53 U/L (ref 33–130)
BILIRUBIN TOTAL: 0.6 mg/dL (ref 0.2–1.2)
BUN: 18 mg/dL (ref 7–25)
CO2: 28 mmol/L (ref 20–31)
CREATININE: 0.93 mg/dL (ref 0.50–1.05)
Calcium: 9.5 mg/dL (ref 8.6–10.4)
Chloride: 104 mmol/L (ref 98–110)
GFR, EST AFRICAN AMERICAN: 82 mL/min (ref >=60)
GFR, Est Non African American: 71 mL/min (ref >=60)
Glucose, Bld: 82 mg/dL (ref 65–99)
Potassium: 4.1 mmol/L (ref 3.5–5.3)
Sodium: 140 mmol/L (ref 135–146)
TOTAL PROTEIN: 6.1 g/dL (ref 6.1–8.1)

## 2016-09-18 LAB — CBC WITH DIFFERENTIAL/PLATELET
BASOS PCT: 0 %
Basophils Absolute: 0 cells/uL (ref 0–200)
EOS ABS: 306 {cells}/uL (ref 15–500)
Eosinophils Relative: 6 %
HCT: 40.1 % (ref 35.0–45.0)
Hemoglobin: 13.2 g/dL (ref 11.7–15.5)
Lymphocytes Relative: 32 %
Lymphs Abs: 1632 cells/uL (ref 850–3900)
MCH: 31.1 pg (ref 27.0–33.0)
MCHC: 32.9 g/dL (ref 32.0–36.0)
MCV: 94.4 fL (ref 80.0–100.0)
MONO ABS: 561 {cells}/uL (ref 200–950)
MPV: 10.8 fL (ref 7.5–12.5)
Monocytes Relative: 11 %
Neutro Abs: 2601 cells/uL (ref 1500–7800)
Neutrophils Relative %: 51 %
PLATELETS: 290 10*3/uL (ref 140–400)
RBC: 4.25 MIL/uL (ref 3.80–5.10)
RDW: 14.8 % (ref 11.0–15.0)
WBC: 5.1 10*3/uL (ref 3.8–10.8)

## 2016-09-19 NOTE — Progress Notes (Signed)
Labs stable

## 2016-09-23 ENCOUNTER — Telehealth: Payer: Self-pay | Admitting: Pharmacist

## 2016-09-23 NOTE — Telephone Encounter (Addendum)
Received a fax from [facility] regarding methotrexate refill.  Fax states "We have been unsuccessful in trying to reach your patient after multiple attempts to contact them to process their next medication refill order."  I called patient to follow up.  There was no answer.  I left a message on her machine asking her to call me back.   [facility] Phone number is [phone].  Lilla Shook, Pharm.D., BCPS, CPP Clinical Pharmacist Pager: [phone] Phone: [phone] 09/23/2016 10:32 AM

## 2016-09-24 NOTE — Telephone Encounter (Signed)
I spoke to patient.  She reports she is having difficulty getting Enbrel filled as her Enbrel support ran out and she is waiting on a new card.  She called Enbrel yesterday to get her card.  She is out of Enbrel and will be due for injection Sunday.  She confirms she has methotrexate at this time.   Last visit: 05/22/16 Next visit: 11/05/16 Labs: 09/18/16 CBC and CMP normal; 04/22/16 TB Gold negative  Okay to give Enbrel sample?

## 2016-09-24 NOTE — Telephone Encounter (Signed)
Sample signed out for patient to pick up.  Patient was notified.  She confirms she will come pick up today.

## 2016-09-24 NOTE — Telephone Encounter (Signed)
Medication Samples have been provided to the patient.  Drug name: Enbrel       Strength: 50 mg/mL       Qty:1  LOT: [id number]  Exp.Date: 09/2018  Dosing instructions: Inject 1 pen under the skin once per week  The patient has been instructed regarding the correct time, dose, and frequency of taking this medication, including desired effects and most common side effects.   Dulcy Fanny 3:50 PM 09/24/2016

## 2016-09-24 NOTE — Telephone Encounter (Signed)
ok 

## 2016-09-27 ENCOUNTER — Other Ambulatory Visit: Payer: Self-pay | Admitting: Family Medicine

## 2016-09-27 NOTE — Telephone Encounter (Signed)
Is this okay to refill? Pt is scheduled 5/30 with you.  Last cpe 10/2015

## 2016-09-30 ENCOUNTER — Other Ambulatory Visit: Payer: Self-pay | Admitting: Rheumatology

## 2016-10-01 NOTE — Telephone Encounter (Signed)
05/22/16 last visit  next visit 11/05/16 Labs normal this month Ok to refill per Dr Corliss Skains

## 2016-10-24 DIAGNOSIS — Z1589 Genetic susceptibility to other disease: Secondary | ICD-10-CM | POA: Insufficient documentation

## 2016-10-24 NOTE — Progress Notes (Deleted)
Office Visit Note  [name]             Date of Birth: 1966-04-16           MRN: [id number]             PCP: Rita Ohara, MD Referring: Rita Ohara, MD Visit Date: 11/05/2016 Occupation: '@GUAROCC' @    Subjective:  No chief complaint on file.   History of Present Illness: Jill Mathews is a 51 y.o. female ***   Activities of Daily Living:  Patient reports morning stiffness for *** {minute/hour:19697}.   Patient {ACTIONS;DENIES/REPORTS:21021675::"Denies"} nocturnal pain.  Difficulty dressing/grooming: {ACTIONS;DENIES/REPORTS:21021675::"Denies"} Difficulty climbing stairs: {ACTIONS;DENIES/REPORTS:21021675::"Denies"} Difficulty getting out of chair: {ACTIONS;DENIES/REPORTS:21021675::"Denies"} Difficulty using hands for taps, buttons, cutlery, and/or writing: {ACTIONS;DENIES/REPORTS:21021675::"Denies"}   No Rheumatology ROS completed.   PMFS History:  Patient Active Problem List   Diagnosis Date Noted  . HLA B27 (HLA B27 positive) 10/24/2016  . Influenza 09/25/2015  . Strep pharyngitis 09/25/2015  . Prediabetes 10/31/2014  . Mixed hyperlipidemia 09/09/2012  . Hypothyroidism 05/25/2012  . Essential hypertension, benign 05/25/2012  . Rheumatoid arthritis ([place]) 05/25/2012  . Obesity (BMI 30-39.9) 05/25/2012    Past Medical History:  Diagnosis Date  . Hyperlipidemia    borderline; dietary management  . Hypertension   . Hypothyroidism 2001  . Rheumatoid arthritis(714.0) 04/2006   Dr. Estanislado Pandy manages    Family History  Problem Relation Age of Onset  . Heart disease Mother 83       MI, stents x 2  . Stroke Mother 67  . Hyperlipidemia Mother   . Hypertension Mother   . Depression Mother   . Diabetes Mother   . Thyroid disease Mother        Graves disease, s/p RAI, now hypothyroid  . Irritable bowel syndrome Mother   . COPD Mother   . Colon polyps Mother   . Cancer Mother        skin (not melanoma)  . Kidney Stones Mother   . Stroke Father 71   . Hyperlipidemia Father   . Hypertension Father   . Depression Father   . Diabetes Father   . Thyroid disease Father        hypothyroidism  . COPD Father   . Heart disease Father 21       MI; s/p CABG; pacemaker  . Cancer Sister 81       pancreatic cancer  . Bipolar disorder Sister   . Lupus Paternal Aunt    Past Surgical History:  Procedure Laterality Date  . PILONIDAL CYST EXCISION  1983  . TONSILLECTOMY  age 12  . WISDOM TOOTH EXTRACTION     Social History   Social History Narrative   Lives with female partner of 87 years--got married 06/11/13. 3 cats, 2 dogs, 10 chickens.      (had to slaughter chicken in order to isolate dog after incident with rabid fox getting into yard; one dog wasn't vaccinated--had to put down)     Objective: Vital Signs: LMP 01/13/2013    Physical Exam   Musculoskeletal Exam: ***  CDAI Exam: No CDAI exam completed.    Investigation: Findings:  Negative TB gold 02/2015   CBC Latest Ref Rng & Units 09/18/2016 04/22/2016 09/25/2015  WBC 3.8 - 10.8 K/uL 5.1 6.4 11.7(H)  Hemoglobin 11.7 - 15.5 g/dL 13.2 13.7 13.0  Hematocrit 35.0 - 45.0 % 40.1 41.6 39.4  Platelets 140 - 400 K/uL 290 307 240   CMP Latest Ref Rng &  Units 09/18/2016 04/22/2016 11/01/2015  Glucose 65 - 99 mg/dL 82 154(H) 91  BUN 7 - 25 mg/dL '18 21 9  ' Creatinine 0.50 - 1.05 mg/dL 0.93 0.95 0.71  Sodium 135 - 146 mmol/L 140 142 141  Potassium 3.5 - 5.3 mmol/L 4.1 4.6 3.9  Chloride 98 - 110 mmol/L 104 106 104  CO2 20 - 31 mmol/L '28 30 27  ' Calcium 8.6 - 10.4 mg/dL 9.5 9.4 9.1  Total Protein 6.1 - 8.1 g/dL 6.1 6.5 6.4  Total Bilirubin 0.2 - 1.2 mg/dL 0.6 0.4 0.5  Alkaline Phos 33 - 130 U/L 53 55 49  AST 10 - 35 U/L '16 18 17  ' ALT 6 - 29 U/L '20 23 24    ' Imaging: No results found.  Speciality Comments: No specialty comments available.    Procedures:  No procedures performed Allergies: Penicillins   Assessment / Plan:     Visit Diagnoses: Rheumatoid arthritis involving  multiple sites with positive rheumatoid factor (HCC) - +RF + CCP erosive disease  HLA B27 (HLA B27 positive)  High risk medication use - Enbrel 50 mg subcutaneous every week, methotrexate 0.8 ml sq qweek, folic acid 2 mg po qd  History of hypertension  History of prediabetes  Obesity (BMI 30-39.9)  History of hypothyroidism  History of hyperlipidemia    Orders: No orders of the defined types were placed in this encounter.  No orders of the defined types were placed in this encounter.   Face-to-face time spent with patient was *** minutes. 50% of time was spent in counseling and coordination of care.  Follow-Up Instructions: No Follow-up on file.   Bo Merino, MD  Note - This record has been created using Editor, commissioning.  Chart creation errors have been sought, but may not always  have been located. Such creation errors do not reflect on  the standard of medical care.

## 2016-11-05 ENCOUNTER — Ambulatory Visit: Payer: BC Managed Care – PPO | Admitting: Rheumatology

## 2016-11-05 NOTE — Progress Notes (Signed)
Chief Complaint  Patient presents with  . Annual Exam    fasting annual exam (blood in lab) no pap. And will NOT do today. Sees eye doctor and is UTD. Thinks she has a sinus infection but other than that all is well, she doesn't want to be treated for it.     Jill Mathews is a 51 y.o. female who presents for a complete physical.    She stayed with her mother after CABG--lots of stress-eating.  Got up to 267#. Started weight watchers in April, losing 1.5#/week. Goal is 200# She otherwise feels good and has no complaints.  Hypertension follow-up: Blood pressures elsewhere are 120's/70's (at home). Denies dizziness, headaches, chest pain, edema. Denies side effects of medications, muscle cramps, cough. Leg cramps diminished after switching to 1/2 salt and 1/2 "no salt" (salt substitute with potassium)--uses this in her water with lemon juice and splenda for hiking/biking.   Hyperlipidemia:h/o borderline cholesterol, which improved with dietary changes.   She continues to eat 1 egg daily, along with 2 whites (has chickens); eating more salads (no longer uses feta cheese), vinaigrette dressing, with salmon or tuna.  Eating a lot more chicken. Continues to eat red meat at least 1-2x/week (venison, steak (rare)).    Lab Results  Component Value Date   CHOL 168 11/01/2015   HDL 43 (L) 11/01/2015   LDLCALC 103 11/01/2015   TRIG 112 11/01/2015   CHOLHDL 3.9 11/01/2015   Hypothyroidism: Compliant with taking Synthroid daily, on an empty stomach, separate from her vitamins. No hair/skin/bowel changes.  Energy is good. Weight loss is intentional.  Lab Results  Component Value Date   TSH 1.35 11/01/2015   H/o pre-diabetes. She had elevated A1c in 2016 of 6.1.   Lab Results  Component Value Date   HGBA1C 5.8 11/01/2015   RA: Under the care of Dr. Corliss Skains, saw her last in December.  Last flare was in November, requiring prednisone.  She is controlled with Enbrel and methotrexate.  Usually has 2 flares/year, at the equinox's.   Immunization History  Administered Date(s) Administered  . Influenza Split 03/10/2012  . Pneumococcal Conjugate-13 11/01/2015  . Pneumococcal Polysaccharide-23 07/02/2006  . Tdap 09/09/2012   Last Pap smear: Age 66--had a very bad experience and refuses; declines exam today. Previously recommended OB-GYN (Dr. Henderson Cloud), but never called. Last mammogram: never and refuses Last colonoscopy: never, but is willing to have (age 73 is what she states, unsure why) Last DEXA:never Dentist: every 6 months Ophtho: yearly (due to meds) Exercise:4.7 mile hikes with 17# pack 2-3 times/week. (did 14 miles with 30# pack over the recent holiday weekend). Treadmill at home 30 minutes if she can't get outside due to weather (about 2x/week). Some weight lifting 2x/week. Walks 1 mile daily (with the dogs),  Normal vitamin D level (36) in 05/2012  Past Medical History:  Diagnosis Date  . Hyperlipidemia    borderline; dietary management  . Hypertension   . Hypothyroidism 2001  . Rheumatoid arthritis(714.0) 04/2006   Dr. Corliss Skains manages    Past Surgical History:  Procedure Laterality Date  . PILONIDAL CYST EXCISION  1983  . TONSILLECTOMY  age 77  . WISDOM TOOTH EXTRACTION      Social History   Social History  . Marital status: Unknown    Spouse name: N/A  . Number of children: N/A  . Years of education: N/A   Occupational History  . teaches biology/ecology at [employer]   Social History  Main Topics  . Smoking status: Former Smoker    Packs/day: 1.50    Years: 26.00    Types: Cigarettes    Start date: 01/08/2005  . Smokeless tobacco: Never Used  . Alcohol use Yes     Comment: 1-2 drinks (beer) per week.  . Drug use: No  . Sexual activity: Yes    Partners: Female   Other Topics Concern  . Not on file   Social History Narrative   Lives with female partner of 36 years--got married 06/11/13.  3 cats, 2 dogs, 5 chickens.      (had to slaughter chicken in order to isolate dog after incident with rabid fox getting into yard; one dog wasn't vaccinated--had to put down)    Family History  Problem Relation Age of Onset  . Heart disease Mother 73       MI, stents x 2  . Stroke Mother 80  . Hyperlipidemia Mother   . Hypertension Mother   . Depression Mother   . Diabetes Mother   . Thyroid disease Mother        Graves disease, s/p RAI, now hypothyroid  . Irritable bowel syndrome Mother   . COPD Mother   . Colon polyps Mother   . Cancer Mother        skin (not melanoma)  . Kidney Stones Mother   . Stroke Father 68  . Hyperlipidemia Father   . Hypertension Father   . Depression Father   . Diabetes Father   . Thyroid disease Father        hypothyroidism  . COPD Father   . Heart disease Father 39       MI; s/p CABG; pacemaker  . Cancer Sister 51       pancreatic cancer  . Bipolar disorder Sister   . Lupus Paternal Aunt     Outpatient Encounter Prescriptions as of 11/06/2016  Medication Sig  . aspirin 81 MG tablet Take 81 mg by mouth every other day.   . benazepril (LOTENSIN) 10 MG tablet Take 1 tablet (10 mg total) by mouth daily.  Marland Kitchen BLACK COHOSH PO Take 2 tablets by mouth daily.  Elgie Collard SURECLICK 50 MG/ML injection INJECT ONE SURECLICK PEN (50 MG) SUBCUTANEOUSLY ONCE A WEEK. REFRIGERATE. DO NOT FREEZE.  . fish oil-omega-3 fatty acids 1000 MG capsule Take 2 g by mouth daily.   . hydrochlorothiazide (HYDRODIURIL) 25 MG tablet Take 1 tablet (25 mg total) by mouth daily.  . methotrexate 250 MG/10ML injection INJECT 0.8 ML SUBCUTANEOUSLY EVERY WEEK  . SYNTHROID 137 MCG tablet TAKE 1 TABLET BY MOUTH DAILY  . TL GARD RX 2.2-25-1 MG TABS TAKE 1 TABLET BY MOUTH DAILY  . [DISCONTINUED] Methotrexate, PF, 12.5 MG/0.4ML SOAJ Inject 0.8 mLs into the skin once a week.  . [DISCONTINUED] Nutritional Supplements (EQ ESTROBLEND MENOPAUSE PO) Take 1 tablet by mouth daily.  .  [DISCONTINUED] ondansetron (ZOFRAN ODT) 4 MG disintegrating tablet Take 1 tablet (4 mg total) by mouth every 8 (eight) hours as needed for nausea or vomiting.  . [DISCONTINUED] oseltamivir (TAMIFLU) 75 MG capsule Take 1 capsule (75 mg total) by mouth 2 (two) times daily.   No facility-administered encounter medications on file as of 11/06/2016.     Allergies  Allergen Reactions  . Penicillins Rash    ROS: The patient denies anorexia, fever, headaches, vision changes, decreased hearing, ear pain, sore throat, breast concerns, chest pain, palpitations, dizziness, syncope, dyspnea on exertion, cough, swelling, nausea, vomiting,  diarrhea, constipation, abdominal pain, melena, hematochezia, indigestion/heartburn, hematuria, incontinence, dysuria, vaginal discharge, odor or itch, genital lesions, numbness, tingling, weakness, tremor, suspicious skin lesions, depression, anxiety, abnormal bleeding/bruising, or enlarged lymph nodes. Has some sinus drainage, rare cough. No fever. Last period was over 3 years ago. Mild hot flashes, tolerable with the supplements she takes, recur if she doesn't take them. Arthritis pain at wrists, elbows, shoulders, knees--only intermittent. And also left 5th toe. Denies pain currently. Intermittent hoarseness-- has seen ENT, told inflammation of vocal cords, felt to be related to her RA by ENT; improves with prednisone--no longer chronic, just sporadic/intermittent--hasn't recurred in a while. Occasional insomnia, sporadic, doesn't use medications. Denies any change to the mole on her nose (never saw derm)   PHYSICAL EXAM:   BP 126/82 (BP Location: Left Arm, Patient Position: Sitting, Cuff Size: Normal)   Pulse 76   Temp 97.9 F (36.6 C) (Tympanic)   Ht 5\' 7"  (1.702 m)   Wt 247 lb (112 kg)   LMP 01/13/2013   BMI 38.69 kg/m   Wt Readings from Last 3 Encounters:  08/16/16 262 lb 3.2 oz (118.9 kg)  05/22/16 255 lb (115.7 kg)  11/01/15 251 lb 6.4 oz (114 kg)     General Appearance:   Alert, cooperative, no distress, appears stated age  Head:   Normocephalic, without obvious abnormality, atraumatic  Eyes:   PERRL, conjunctiva/corneas clear, EOM's intact, fundi benign  Ears:   Normal TM's and external ear canals  Nose:  Nares normal, mucosa is mildly edematous, no erythema or sinus tenderness  Throat:  Lips, mucosa, and tongue normal; teeth and gums normal  Neck:  Supple, no lymphadenopathy; thyroid: no enlargement/tenderness/nodules; no carotid bruit or JVD  Back:  Spine nontender, no curvature, ROM normal, no CVA tenderness  Lungs:   Clear to auscultation bilaterally without wheezes, rales or ronchi; respirations unlabored  Chest Wall:   No tenderness or deformity  Heart:   Regular rate and rhythm, S1 and S2 normal, no murmur, rub or gallop  Breast Exam:   exam refused by patient  Abdomen:   Soft, non-tender, nondistended, normoactive bowel sounds, no masses, no hepatosplenomegaly  Genitalia:   Refused by patient     Extremities:  No clubbing, cyanosis or edema  Pulses:  2+ and symmetric all extremities  Skin:  Skin color, texture, turgor normal, no rashes.   Lymph nodes:  Cervical, supraclavicular nodes normal  Neurologic:  CNII-XII intact, normal strength, sensation and gait; reflexes 2+ and symmetric throughout    Psych: Normal mood, affect, hygiene and grooming  Lab Results  Component Value Date   WBC 5.1 09/18/2016   HGB 13.2 09/18/2016   HCT 40.1 09/18/2016   MCV 94.4 09/18/2016   PLT 290 09/18/2016     Chemistry      Component Value Date/Time   NA 140 09/18/2016 0933   K 4.1 09/18/2016 0933   CL 104 09/18/2016 0933   CO2 28 09/18/2016 0933   BUN 18 09/18/2016 0933   CREATININE 0.93 09/18/2016 0933      Component Value Date/Time   CALCIUM 9.5 09/18/2016 0933   ALKPHOS 53 09/18/2016 0933   AST 16  09/18/2016 0933   ALT 20 09/18/2016 0933   BILITOT 0.6 09/18/2016 0933     Lab Results  Component Value Date   HGBA1C 5.4 11/06/2016    ASSESSMENT/PLAN:  Annual physical exam - Plan: POCT Urinalysis Dipstick  Rheumatoid arthritis involving multiple sites, unspecified rheumatoid factor presence (HCC) -  stable  Mixed hyperlipidemia - Plan: Lipid panel  Hypothyroidism, unspecified type - Plan: TSH  Prediabetes - continue exercise, weight loss, proper diet. A1c normal today - Plan: HgB A1c  High risk medications (not anticoagulants) long-term use - DEXA recommended given frequent steroid use - Plan: DG Bone Density  Screening for osteoporosis - Plan: DG Bone Density  Essential hypertension, benign - well controlled - Plan: benazepril (LOTENSIN) 10 MG tablet, hydrochlorothiazide (HYDRODIURIL) 25 MG tablet   Discussed monthly self breast exams and yearly mammograms (strongly encouraged 3D mammogram, to schedule same day as DEXA); at least 30 minutes of aerobic activity at least 5 days/week, weight-bearing exercise at least 2x/week; proper sunscreen use reviewed; healthy diet, including goals of calcium and vitamin D intake and alcohol recommendations (less than or equal to 1 drink/day) reviewed; regular seatbelt use; changing batteries in smoke detectors. Immunization recommendations discussed--Yearly flu shots are recommended.. Shingrix recommended. Colonoscopy recommendations reviewed--past due, encouraged to schedule with GI. She did agree to do this at age 48, but willing to look into Cologard and do that now if covered. Encouraged her to re-consider seeing GYN (or returning here) for pelvic exam. Counseled extensively regarding risks associated with delayed screening for breast cancer, female cancers, colon cancer.  Strongly encouraged her to reconsider pelvic/pap, and to call to schedule 3D mammogram and colonoscopy  Interested in Cologard, if covered, because less invasive.  F/u  1 year, sooner prn

## 2016-11-06 ENCOUNTER — Encounter: Payer: Self-pay | Admitting: Family Medicine

## 2016-11-06 ENCOUNTER — Ambulatory Visit (INDEPENDENT_AMBULATORY_CARE_PROVIDER_SITE_OTHER): Payer: BC Managed Care – PPO | Admitting: Family Medicine

## 2016-11-06 VITALS — BP 126/82 | HR 76 | Temp 97.9°F | Ht 67.0 in | Wt 247.0 lb

## 2016-11-06 DIAGNOSIS — E782 Mixed hyperlipidemia: Secondary | ICD-10-CM

## 2016-11-06 DIAGNOSIS — I10 Essential (primary) hypertension: Secondary | ICD-10-CM | POA: Diagnosis not present

## 2016-11-06 DIAGNOSIS — Z13820 Encounter for screening for osteoporosis: Secondary | ICD-10-CM | POA: Diagnosis not present

## 2016-11-06 DIAGNOSIS — Z79899 Other long term (current) drug therapy: Secondary | ICD-10-CM

## 2016-11-06 DIAGNOSIS — Z0000 Encounter for general adult medical examination without abnormal findings: Secondary | ICD-10-CM | POA: Diagnosis not present

## 2016-11-06 DIAGNOSIS — R7303 Prediabetes: Secondary | ICD-10-CM | POA: Diagnosis not present

## 2016-11-06 DIAGNOSIS — M069 Rheumatoid arthritis, unspecified: Secondary | ICD-10-CM | POA: Diagnosis not present

## 2016-11-06 DIAGNOSIS — E039 Hypothyroidism, unspecified: Secondary | ICD-10-CM | POA: Diagnosis not present

## 2016-11-06 LAB — POCT URINALYSIS DIPSTICK
BILIRUBIN UA: NEGATIVE
Blood, UA: NEGATIVE
GLUCOSE UA: NEGATIVE
Ketones, UA: NEGATIVE
Nitrite, UA: NEGATIVE
Protein, UA: NEGATIVE
Spec Grav, UA: 1.01 (ref 1.010–1.025)
Urobilinogen, UA: NEGATIVE E.U./dL — AB
pH, UA: 7 (ref 5.0–8.0)

## 2016-11-06 LAB — TSH: TSH: 2.51 mIU/L

## 2016-11-06 LAB — LIPID PANEL
CHOL/HDL RATIO: 3.7 ratio (ref <5.0)
Cholesterol: 183 mg/dL (ref <200)
HDL: 49 mg/dL — AB (ref >50)
LDL CALC: 113 mg/dL — AB (ref <100)
Triglycerides: 106 mg/dL (ref <150)
VLDL: 21 mg/dL (ref <30)

## 2016-11-06 LAB — POCT GLYCOSYLATED HEMOGLOBIN (HGB A1C): Hemoglobin A1C: 5.4

## 2016-11-06 MED ORDER — BENAZEPRIL HCL 10 MG PO TABS: 10.0000 mg | ORAL_TABLET | Freq: Every day | ORAL | 3 refills | Status: DC

## 2016-11-06 MED ORDER — HYDROCHLOROTHIAZIDE 25 MG PO TABS: 25.0000 mg | ORAL_TABLET | Freq: Every day | ORAL | 3 refills | Status: DC

## 2016-11-06 NOTE — Patient Instructions (Addendum)
  HEALTH MAINTENANCE RECOMMENDATIONS:  It is recommended that you get at least 30 minutes of aerobic exercise at least 5 days/week (for weight loss, you may need as much as 60-90 minutes). This can be any activity that gets your heart rate up. This can be divided in 10-15 minute intervals if needed, but try and build up your endurance at least once a week.  Weight bearing exercise is also recommended twice weekly.  Eat a healthy diet with lots of vegetables, fruits and fiber.  "Colorful" foods have a lot of vitamins (ie green vegetables, tomatoes, red peppers, etc).  Limit sweet tea, regular sodas and alcoholic beverages, all of which has a lot of calories and sugar.  Up to 1 alcoholic drink daily may be beneficial for women (unless trying to lose weight, watch sugars).  Drink a lot of water.  Calcium recommendations are 1200-1500 mg daily (1500 mg for postmenopausal women or women without ovaries), and vitamin D 1000 IU daily.  This should be obtained from diet and/or supplements (vitamins), and calcium should not be taken all at once, but in divided doses.  Monthly self breast exams and yearly mammograms for women over the age of 48 is recommended.  Sunscreen of at least SPF 30 should be used on all sun-exposed parts of the skin when outside between the hours of 10 am and 4 pm (not just when at beach or pool, but even with exercise, golf, tennis, and yard work!)  Use a sunscreen that says "broad spectrum" so it covers both UVA and UVB rays, and make sure to reapply every 1-2 hours.  Remember to change the batteries in your smoke detectors when changing your clock times in the spring and fall.  Use your seat belt every time you are in a car, and please drive safely and not be distracted with cell phones and texting while driving.  I recommend getting the new shingles vaccine (Shingrix). You will need to check with your insurance to see if it is covered.  It is a series of 2 injections, spaced 2  months apart.  Consider Cologard as an alternative to colonoscopy.  Colonoscopy would be my preference, but Cologard is a good screening as an alternative (better than not doing anything). If it is abnormal, a colonoscopy is needed.  If it is normal, it can be repeated in 3 years.  Please call the [facility]) to schedule your bone density test.  I also highly recommend scheduling a 3D mammogram.

## 2016-12-04 ENCOUNTER — Other Ambulatory Visit: Payer: Self-pay | Admitting: Family Medicine

## 2016-12-04 DIAGNOSIS — M0520 Rheumatoid vasculitis with rheumatoid arthritis of unspecified site: Secondary | ICD-10-CM

## 2016-12-22 ENCOUNTER — Other Ambulatory Visit: Payer: Self-pay | Admitting: Family Medicine

## 2016-12-25 ENCOUNTER — Other Ambulatory Visit: Payer: Self-pay | Admitting: Rheumatology

## 2016-12-25 NOTE — Telephone Encounter (Signed)
Last Visit: 05/22/16 Next Visit :12/26/16 Labs: 09/18/16  Stable Tb Gold: 04/22/16 Neg   Okay to refill per Dr. Corliss Skains

## 2016-12-25 NOTE — Progress Notes (Signed)
Office Visit Note  [name]             Date of Birth: May 19, 1966           MRN: [id number]             PCP: Rita Ohara, MD Referring: Rita Ohara, MD Visit Date: 12/26/2016 Occupation: _0 @    Subjective:  Medication management .   History of Present Illness: Jill Mathews is a 51 y.o. female with history of sero positive rheumatoid arthritis. She states her arthritis overall is doing well. She has a neuroma in her right foot for which she had to cortisone injections and did not respond. She will be having future surgery for that. She only notices some discomfort in her joints after doing certain activities.  Activities of Daily Living:  Patient reports morning stiffness for 0 minute.   Patient Denies nocturnal pain.  Difficulty dressing/grooming: Denies Difficulty climbing stairs: Denies Difficulty getting out of chair: Denies Difficulty using hands for taps, buttons, cutlery, and/or writing: Denies   Review of Systems  Constitutional: Negative for fatigue, night sweats, weight gain, weight loss and weakness.  HENT: Negative for mouth sores, trouble swallowing, trouble swallowing, mouth dryness and nose dryness.   Eyes: Negative for pain, redness, visual disturbance and dryness.  Respiratory: Negative for cough, shortness of breath and difficulty breathing.   Cardiovascular: Negative for chest pain, palpitations, hypertension, irregular heartbeat and swelling in legs/feet.  Gastrointestinal: Negative for blood in stool, constipation and diarrhea.  Endocrine: Negative for increased urination.  Genitourinary: Negative for vaginal dryness.  Musculoskeletal: Positive for arthralgias and joint pain. Negative for joint swelling, myalgias, muscle weakness, morning stiffness, muscle tenderness and myalgias.  Skin: Negative for color change, rash, hair loss, skin tightness, ulcers and sensitivity to sunlight.  Allergic/Immunologic: Negative for susceptible to  infections.  Neurological: Negative for dizziness, memory loss and night sweats.  Hematological: Negative for swollen glands.  Psychiatric/Behavioral: Positive for sleep disturbance. Negative for depressed mood. The patient is not nervous/anxious.     PMFS History:  Patient Active Problem List   Diagnosis Date Noted  . HLA B27 (HLA B27 positive) 10/24/2016  . Influenza 09/25/2015  . Strep pharyngitis 09/25/2015  . Prediabetes 10/31/2014  . Mixed hyperlipidemia 09/09/2012  . Hypothyroidism 05/25/2012  . Essential hypertension, benign 05/25/2012  . Rheumatoid arthritis involving multiple sites with positive rheumatoid factor ([place]) 05/25/2012  . Obesity (BMI 30-39.9) 05/25/2012    Past Medical History:  Diagnosis Date  . Hyperlipidemia    borderline; dietary management  . Hypertension   . Hypothyroidism 2001  . Rheumatoid arthritis(714.0) 04/2006   Dr. Estanislado Pandy manages    Family History  Problem Relation Age of Onset  . Heart disease Mother 84       MI, stents x 2; CABG age 39  . Stroke Mother 65  . Hyperlipidemia Mother   . Hypertension Mother   . Depression Mother   . Diabetes Mother   . Thyroid disease Mother        Graves disease, s/p RAI, now hypothyroid  . Irritable bowel syndrome Mother   . COPD Mother   . Colon polyps Mother   . Cancer Mother        skin (not melanoma)  . Kidney Stones Mother   . Stroke Father 94  . Hyperlipidemia Father   . Hypertension Father   . Depression Father   . Diabetes Father   . Thyroid disease Father  hypothyroidism  . COPD Father   . Heart disease Father 24       MI; s/p CABG; pacemaker  . Cancer Sister 93       pancreatic cancer  . Bipolar disorder Sister   . Lupus Paternal Aunt   . Heart disease Maternal Uncle        s/p CABG, late 56's  . Cancer Maternal Grandfather        laryngeal, smoker   Past Surgical History:  Procedure Laterality Date  . PILONIDAL CYST EXCISION  1983  . TONSILLECTOMY  age 44  .  WISDOM TOOTH EXTRACTION     Social History   Social History Narrative   Lives with female partner of 74 years--got married 06/11/13. 3 cats, 2 dogs, 5 chickens.      (had to slaughter chicken in order to isolate dog after incident with rabid fox getting into yard; one dog wasn't vaccinated--had to put down)     Objective: Vital Signs: BP 120/76   Pulse 64   Resp 14   Ht '5\' 7"'$  (1.702 m)   Wt 250 lb (113.4 kg)   LMP 01/13/2013   BMI 39.16 kg/m    Physical Exam  Constitutional: She is oriented to person, place, and time. She appears well-developed and well-nourished.  HENT:  Head: Normocephalic and atraumatic.  Eyes: Conjunctivae and EOM are normal.  Neck: Normal range of motion.  Cardiovascular: Normal rate, regular rhythm, normal heart sounds and intact distal pulses.   Pulmonary/Chest: Effort normal and breath sounds normal.  Abdominal: Soft. Bowel sounds are normal.  Lymphadenopathy:    She has no cervical adenopathy.  Neurological: She is alert and oriented to person, place, and time.  Skin: Skin is warm and dry. Capillary refill takes less than 2 seconds.  Psychiatric: She has a normal mood and affect. Her behavior is normal.  Nursing note and vitals reviewed.    Musculoskeletal Exam: C-spine and thoracic lumbar spine good range of motion. Shoulder joints elbow joints wrist joint MCPs PIPs DIPs are good range of motion with no synovitis.  CDAI Exam: CDAI Homunculus Exam:   Joint Counts:  CDAI Tender Joint count: 0 CDAI Swollen Joint count: 0  Global Assessments:  Patient Global Assessment: 1 Provider Global Assessment: 1  CDAI Calculated Score: 2    Investigation: No additional findings.  09/18/2016 CBC normal, CMP normal, 11/06/2016 TSH normal,  04/22/2016 TB gold negative Imaging: No results found.  Speciality Comments: No specialty comments available.    Procedures:  No procedures performed Allergies: Penicillins   Assessment / Plan:       Visit Diagnoses: Rheumatoid arthritis involving multiple sites with positive rheumatoid factor (HCC) - +RF,+ anti-CCP. Patient has no synovitis on examination clinically she is doing very well. We had detailed discussion regarding tapering her medication. She can space her Enbrel to every 10 days as possible. If she does well we can space it further to every 2 weeks.  HLA B27 (HLA B27 positive)  High risk medication use - Enbrel 50 mg by mouth every week, methotrexate 0.8 ML subcutaneous every week, folic acid 2 mg by mouth daily - Plan: Quantiferon tb gold assay (blood). Her labs are due today and then every 3 months to monitor for drug toxicity.  Morton's neuroma right foot: She'll be having surgery for that. Advised her to discuss stopping her methotrexate a week before restarting a week after an Brohl stopping 2 weeks before and restarting 2 weeks after if she  has surgery..  Essential hypertension, benign: Blood pressure is well controlled.  Hypothyroidism, unspecified type  Mixed hyperlipidemia  Obesity (BMI 30-39.9)  Prediabetes    Orders: Orders Placed This Encounter  Procedures  . Quantiferon tb gold assay (blood)   No orders of the defined types were placed in this encounter.   Face-to-face time spent with patient was 30 minutes. 50% of time was spent in counseling and coordination of care.  Follow-Up Instructions: Return in about 5 months (around 05/28/2017) for Rheumatoid arthritis.   Bo Merino, MD  Note - This record has been created using Editor, commissioning.  Chart creation errors have been sought, but may not always  have been located. Such creation errors do not reflect on  the standard of medical care.

## 2016-12-26 ENCOUNTER — Ambulatory Visit (INDEPENDENT_AMBULATORY_CARE_PROVIDER_SITE_OTHER): Payer: BC Managed Care – PPO | Admitting: Rheumatology

## 2016-12-26 ENCOUNTER — Encounter: Payer: Self-pay | Admitting: Rheumatology

## 2016-12-26 ENCOUNTER — Other Ambulatory Visit: Payer: Self-pay | Admitting: Pharmacist

## 2016-12-26 VITALS — BP 120/76 | HR 64 | Resp 14 | Ht 67.0 in | Wt 250.0 lb

## 2016-12-26 DIAGNOSIS — I10 Essential (primary) hypertension: Secondary | ICD-10-CM

## 2016-12-26 DIAGNOSIS — R7303 Prediabetes: Secondary | ICD-10-CM | POA: Diagnosis not present

## 2016-12-26 DIAGNOSIS — M0579 Rheumatoid arthritis with rheumatoid factor of multiple sites without organ or systems involvement: Secondary | ICD-10-CM

## 2016-12-26 DIAGNOSIS — E039 Hypothyroidism, unspecified: Secondary | ICD-10-CM | POA: Diagnosis not present

## 2016-12-26 DIAGNOSIS — Z1589 Genetic susceptibility to other disease: Secondary | ICD-10-CM

## 2016-12-26 DIAGNOSIS — E782 Mixed hyperlipidemia: Secondary | ICD-10-CM

## 2016-12-26 DIAGNOSIS — E669 Obesity, unspecified: Secondary | ICD-10-CM

## 2016-12-26 DIAGNOSIS — Z79899 Other long term (current) drug therapy: Secondary | ICD-10-CM

## 2016-12-26 LAB — CBC WITH DIFFERENTIAL/PLATELET
BASOS ABS: 0 {cells}/uL (ref 0–200)
BASOS PCT: 0 %
EOS ABS: 166 {cells}/uL (ref 15–500)
EOS PCT: 2 %
HCT: 42.3 % (ref 35.0–45.0)
HEMOGLOBIN: 13.8 g/dL (ref 11.7–15.5)
LYMPHS ABS: 2158 {cells}/uL (ref 850–3900)
Lymphocytes Relative: 26 %
MCH: 31.4 pg (ref 27.0–33.0)
MCHC: 32.6 g/dL (ref 32.0–36.0)
MCV: 96.4 fL (ref 80.0–100.0)
MONOS PCT: 8 %
MPV: 10.3 fL (ref 7.5–12.5)
Monocytes Absolute: 664 cells/uL (ref 200–950)
NEUTROS ABS: 5312 {cells}/uL (ref 1500–7800)
Neutrophils Relative %: 64 %
PLATELETS: 324 10*3/uL (ref 140–400)
RBC: 4.39 MIL/uL (ref 3.80–5.10)
RDW: 14 % (ref 11.0–15.0)
WBC: 8.3 10*3/uL (ref 3.8–10.8)

## 2016-12-26 NOTE — Patient Instructions (Signed)
Standing Labs We placed an order today for your standing lab work.    Please come back and get your standing labs in October 2018 and every 3 months.  You will be due for your TB Gold with your next labs.   We have open lab Monday through Friday from 8:30-11:30 AM and 1:30-4 PM at the office of Dr. Pollyann Savoy.   The office is located at [address], [place], [place] [address] No appointment is necessary.   Labs are drawn by [facility].  You may receive a bill from [facility] for your lab work. If you have any questions regarding directions or hours of operation,  please call [phone].

## 2016-12-26 NOTE — Progress Notes (Signed)
Rheumatology Medication Review by a Pharmacist Does the patient feel that his/her medications are working for him/her?  Yes Has the patient been experiencing any side effects to the medications prescribed?  No Does the patient have any problems obtaining medications?  No  Issues to address at subsequent visits: None   Pharmacist comments:  Jill Mathews is a pleasant 51 yo F who presents for follow up.  She is currently taking Enbrel 50 mg weekly, methotrexate 0.8 mL weekly, and folic acid 2.2 mg (in Jill Mathews Rx).  Most recent standing labs were normal on 09/18/16.  She is due for labs today and every 3 months.  Most recent TB Gold negative in November 2017.  Patient will be due for standing labs again in November 2018.  Patient denies any questions or concerns regarding her medications at this time.   Lilla Shook, Pharm.D., BCPS, CPP Clinical Pharmacist Pager: [phone] Phone: [phone] 12/26/2016 3:25 PM

## 2016-12-27 LAB — COMPLETE METABOLIC PANEL WITH GFR
ALBUMIN: 3.8 g/dL (ref 3.6–5.1)
ALK PHOS: 57 U/L (ref 33–130)
ALT: 19 U/L (ref 6–29)
AST: 15 U/L (ref 10–35)
BILIRUBIN TOTAL: 0.3 mg/dL (ref 0.2–1.2)
BUN: 24 mg/dL (ref 7–25)
CO2: 23 mmol/L (ref 20–31)
Calcium: 9.5 mg/dL (ref 8.6–10.4)
Chloride: 104 mmol/L (ref 98–110)
Creat: 0.83 mg/dL (ref 0.50–1.05)
GFR, Est African American: >89 mL/min (ref >=60)
GFR, Est Non African American: 81 mL/min (ref >=60)
GLUCOSE: 86 mg/dL (ref 65–99)
Potassium: 4.7 mmol/L (ref 3.5–5.3)
SODIUM: 141 mmol/L (ref 135–146)
TOTAL PROTEIN: 6.3 g/dL (ref 6.1–8.1)

## 2016-12-27 NOTE — Progress Notes (Signed)
wnl

## 2016-12-30 ENCOUNTER — Other Ambulatory Visit: Payer: Self-pay | Admitting: Family Medicine

## 2016-12-30 NOTE — Telephone Encounter (Signed)
This was done back on 5/30. I will resend it in

## 2017-01-24 ENCOUNTER — Telehealth: Payer: Self-pay | Admitting: Pharmacist

## 2017-01-24 NOTE — Telephone Encounter (Signed)
Received notification from [facility] stating they have been unsuccessful in trying to reach this patient to refill their Enbrel.  I called patient who reports she spoke to [facility] today to set up her refill.  She denies any questions or concerns regarding her medications at this time.   Lilla Shook, Pharm.D., BCPS, CPP Clinical Pharmacist Pager: [phone] Phone: [phone] 01/24/2017 12:16 PM

## 2017-02-07 ENCOUNTER — Telehealth: Payer: Self-pay | Admitting: Rheumatology

## 2017-02-07 ENCOUNTER — Telehealth: Payer: Self-pay | Admitting: *Deleted

## 2017-02-07 MED ORDER — PREDNISONE 5 MG PO TABS: ORAL_TABLET | ORAL | 0 refills | Status: DC

## 2017-02-07 NOTE — Telephone Encounter (Signed)
Left message to advised patient prescription has been sent to the pharmacy. Patient also advised to resume using Enbrel every 7 days.

## 2017-02-07 NOTE — Telephone Encounter (Signed)
Patient is currently on Enbrel and at her last appointment she was instructed to apce out her Enbrel to every 10 days. Patient states she has done so and is now having a flare. Patient is having pain in her shoulders, elbows, wrists and starting to have pain in her hips. Patient is wanting go back to taking the Enbrel every 7 days. She is also requesting a prescription to be sent to the pharmacy. Please adivse.

## 2017-02-07 NOTE — Telephone Encounter (Signed)
Spoke with patient and advised patient message has been sent to Dr. Corliss Skains and awaiting response. See previous phone message for response.

## 2017-02-07 NOTE — Telephone Encounter (Signed)
Patient requesting a rx for Prednisone to be called in. She is in the middle of a flare. Patient uses [facility] on [address]. Patient states symptoms started Sunday, and she called Monday leaving a message she thought for Sue Lush. Patient able to answer phone before 12pm. Due to teaching. Please call patient to advise.

## 2017-02-07 NOTE — Telephone Encounter (Signed)
Okay to give prednisone taper. Prednisone 5 mg, 4 tablets for 4 days, 3 for 4 days, 2 for 4 days, 1 for 4 days, half for 4 days. She should resume Enbrel every 7 days.

## 2017-02-08 IMAGING — CR DG CHEST 2V
2 series · 2 of 2 positions shown · non-contrast
Comparison: PA and lateral chest x-ray June 18, 2006

CLINICAL DATA: One week of cough and fever ; former smoker.

EXAM:
CHEST  2 VIEW

[w chest pa]
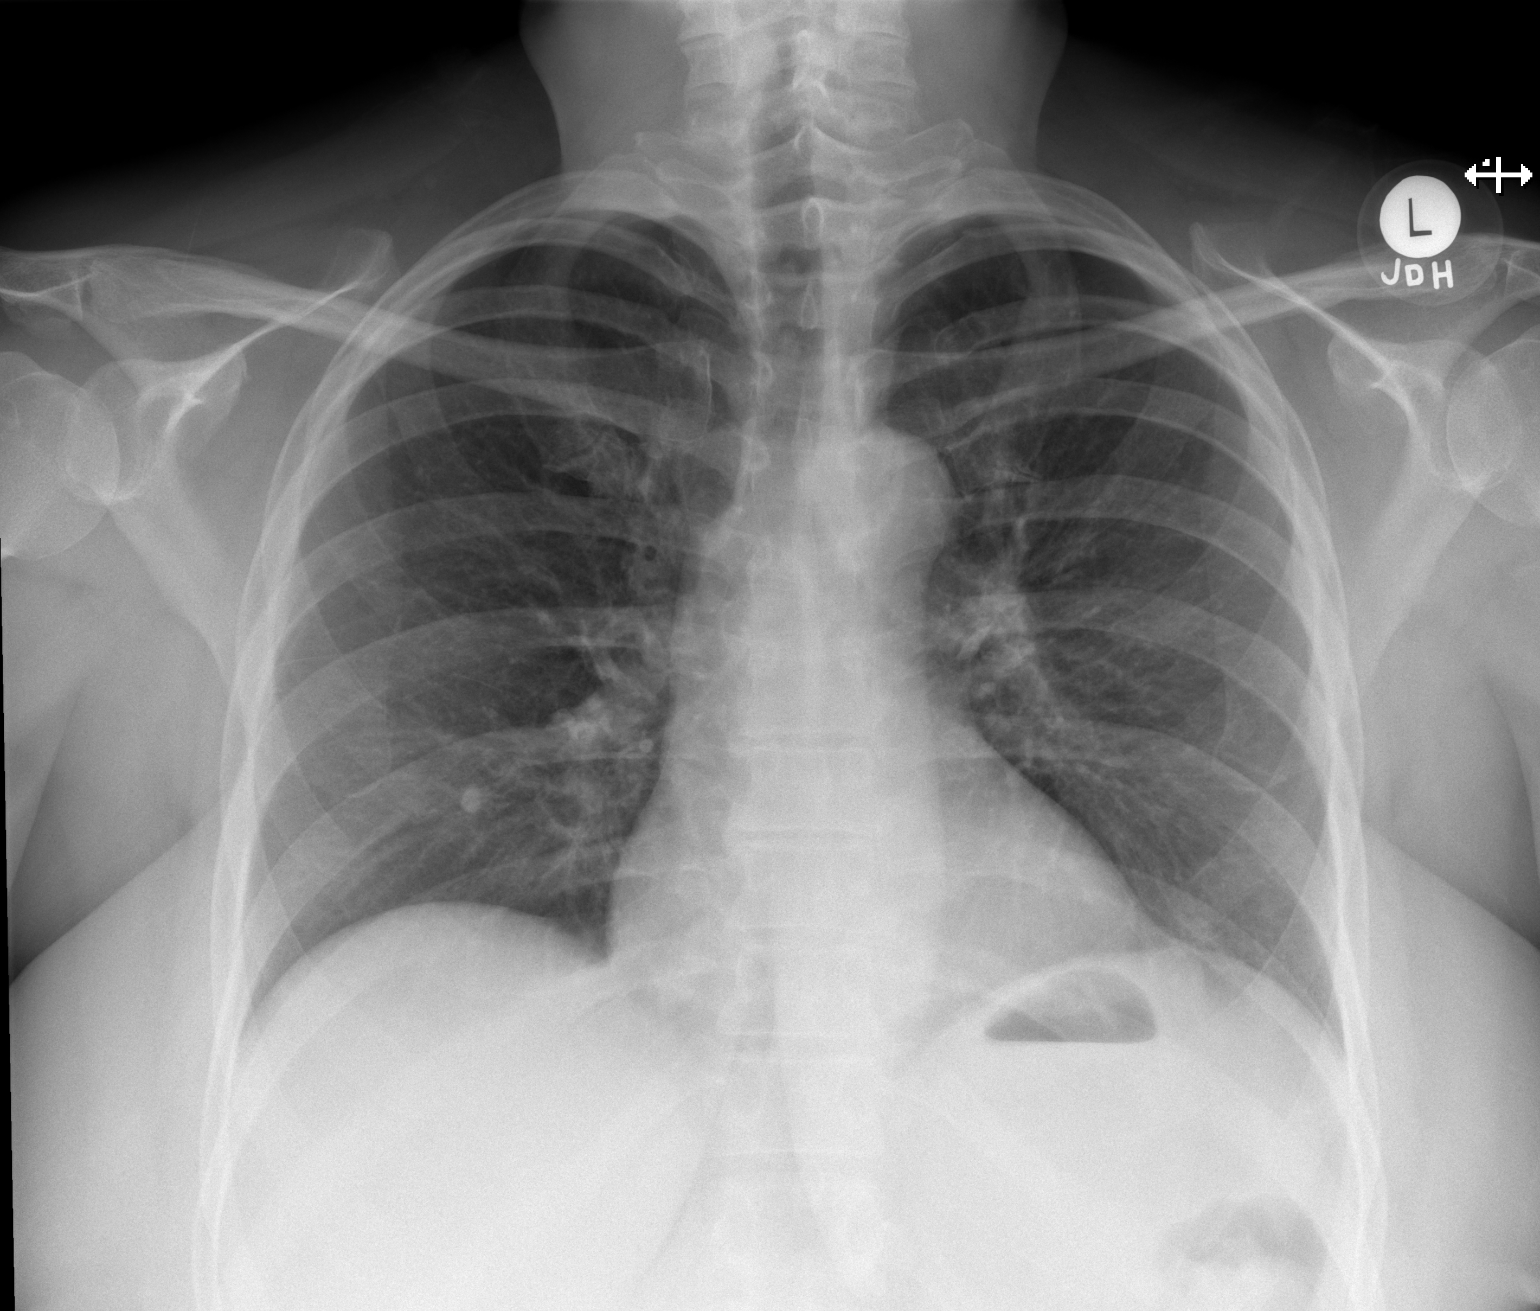

[w chest lat]
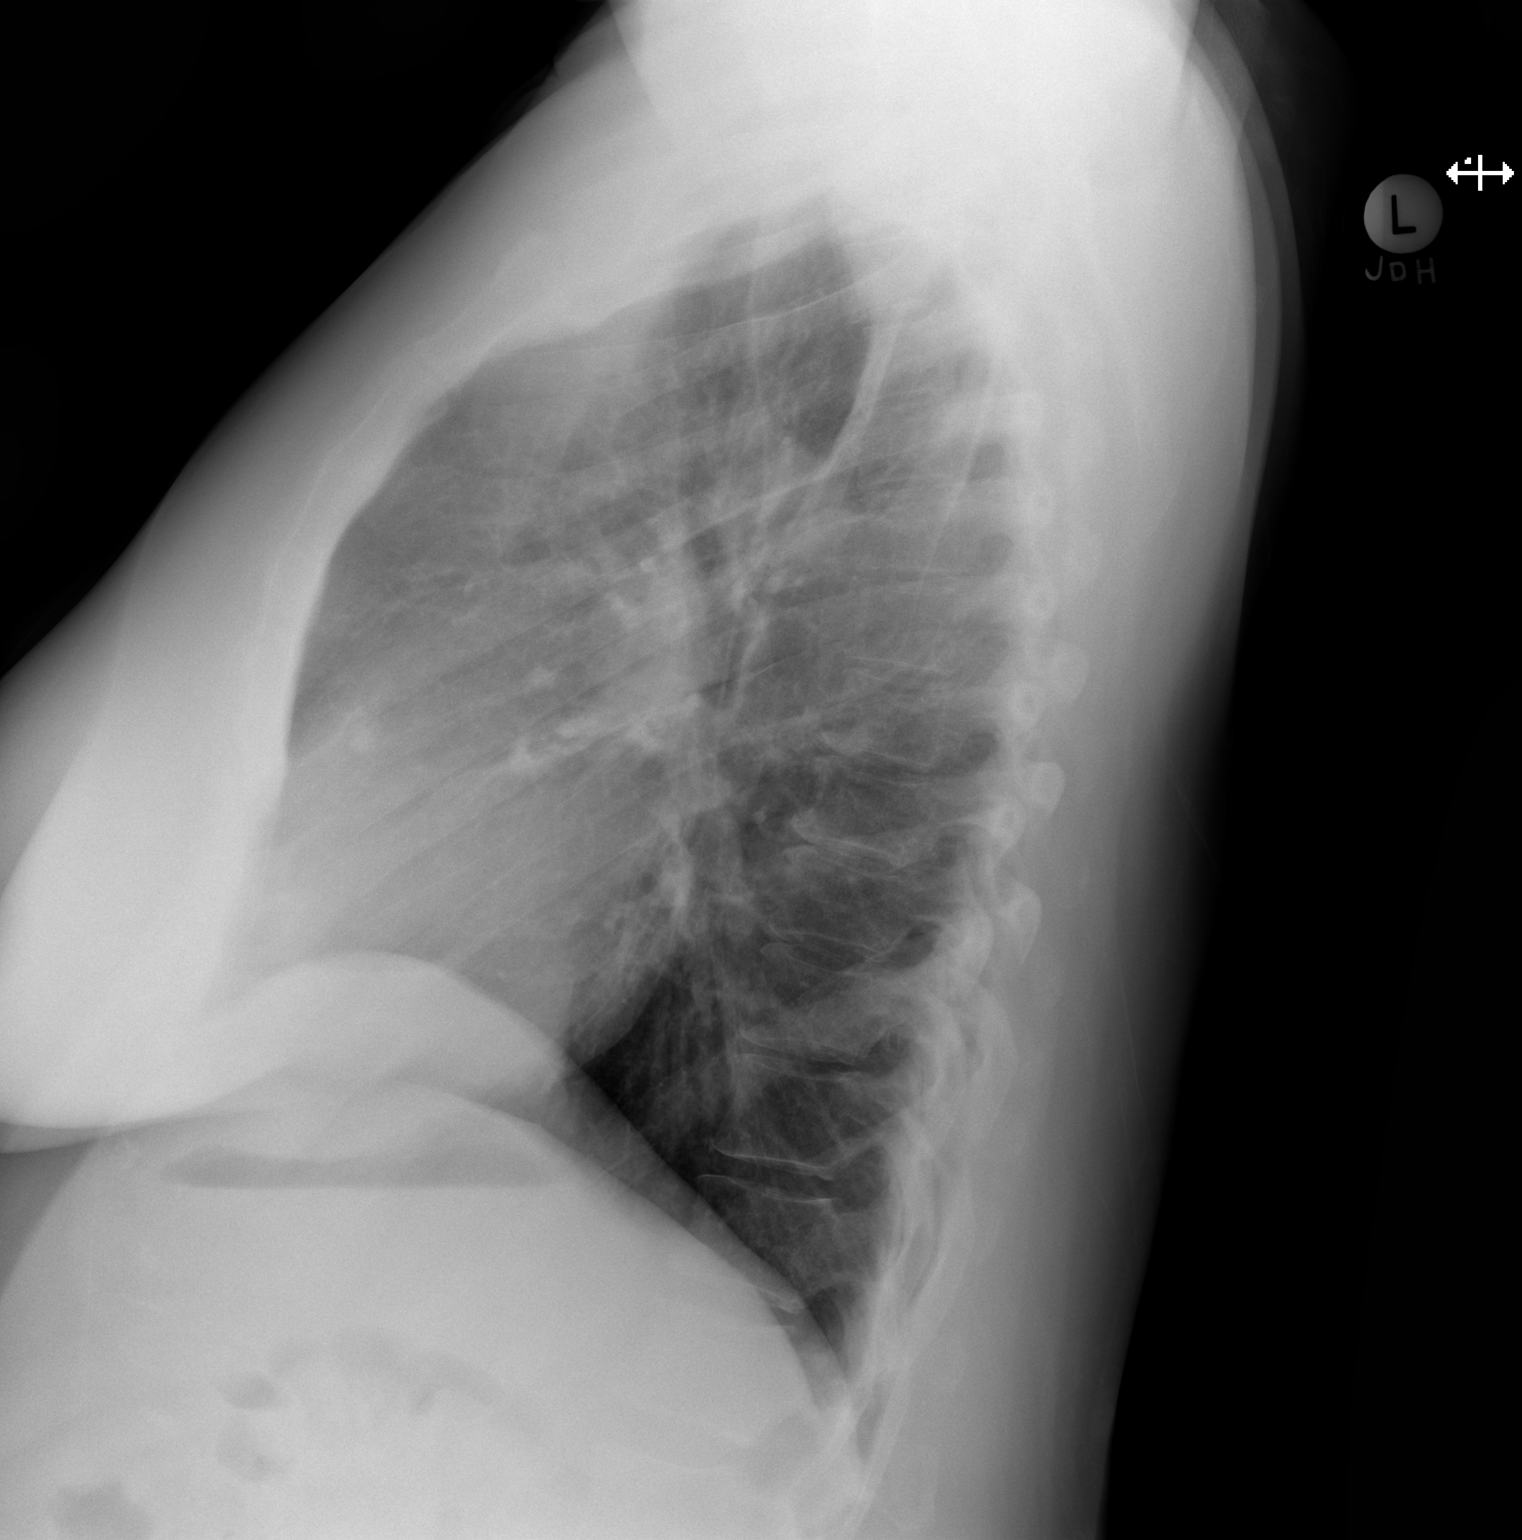

[2 of 2 positions shown; findings below may reference images not displayed]

FINDINGS: The lungs are less well inflated today. There is no focal
infiltrate. There are stable calcified subcentimeter nodules in the
right hilar region and anterior aspect of the right middle lobe.
There is no pleural effusion. The heart and pulmonary vascularity
are normal. The mediastinum is normal in width. The bony thorax
exhibits no acute abnormality.
IMPRESSION: Evidence of previous granulomatous infection, stable. There is no
active cardiopulmonary disease.

## 2017-04-09 ENCOUNTER — Other Ambulatory Visit: Payer: Self-pay | Admitting: Rheumatology

## 2017-04-09 NOTE — Telephone Encounter (Addendum)
Last visit:  12/26/2016 Next visit: 05/26/2017 Labs: 12/26/2016 WNL  TB Gold: 04/22/2016 Negative   Left message to advise patient she is due for labs.

## 2017-04-18 NOTE — Telephone Encounter (Signed)
okay to refill 30 day supply per Dr. Corliss Skains

## 2017-05-05 ENCOUNTER — Telehealth: Payer: Self-pay | Admitting: Rheumatology

## 2017-05-05 ENCOUNTER — Other Ambulatory Visit: Payer: Self-pay | Admitting: *Deleted

## 2017-05-05 ENCOUNTER — Other Ambulatory Visit: Payer: Self-pay

## 2017-05-05 DIAGNOSIS — Z79899 Other long term (current) drug therapy: Secondary | ICD-10-CM

## 2017-05-05 MED ORDER — ETANERCEPT 50 MG/ML ~~LOC~~ SOAJ: SUBCUTANEOUS | 0 refills | Status: DC

## 2017-05-05 MED ORDER — METHOTREXATE SODIUM CHEMO INJECTION 250 MG/10ML: INTRAMUSCULAR | 0 refills | Status: DC

## 2017-05-05 NOTE — Telephone Encounter (Signed)
Patient needs refill on MTX ([facility] [address]), and Enbrel ([facility]) just as soon as lab results come back from today. Patient is completely out of MTX. 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

## 2017-05-05 NOTE — Telephone Encounter (Signed)
Last visit:  12/26/2016 Next visit: 05/26/2017 Labs: 12/26/2016 WNL  TB Gold: 04/22/2016 Negative   Patient came in to update labs today.

## 2017-05-06 NOTE — Telephone Encounter (Signed)
Opened in error

## 2017-05-06 NOTE — Progress Notes (Signed)
ALT mildly elevated. Decrease MTX to 0.6 ml sq q wk

## 2017-05-07 ENCOUNTER — Telehealth: Payer: Self-pay | Admitting: Rheumatology

## 2017-05-07 LAB — COMPLETE METABOLIC PANEL WITH GFR
AG RATIO: 1.7 (calc) (ref 1.0–2.5)
ALT: 30 U/L — AB (ref 6–29)
AST: 21 U/L (ref 10–35)
Albumin: 4.1 g/dL (ref 3.6–5.1)
Alkaline phosphatase (APISO): 59 U/L (ref 33–130)
BUN: 20 mg/dL (ref 7–25)
CALCIUM: 9.3 mg/dL (ref 8.6–10.4)
CO2: 32 mmol/L (ref 20–32)
CREATININE: 0.82 mg/dL (ref 0.50–1.05)
Chloride: 101 mmol/L (ref 98–110)
GFR, EST AFRICAN AMERICAN: 95 mL/min/{1.73_m2} (ref >=60)
GFR, EST NON AFRICAN AMERICAN: 82 mL/min/{1.73_m2} (ref >=60)
Globulin: 2.4 g/dL (calc) (ref 1.9–3.7)
Glucose, Bld: 125 mg/dL — ABNORMAL HIGH (ref 65–99)
POTASSIUM: 3.7 mmol/L (ref 3.5–5.3)
Sodium: 140 mmol/L (ref 135–146)
TOTAL PROTEIN: 6.5 g/dL (ref 6.1–8.1)
Total Bilirubin: 0.5 mg/dL (ref 0.2–1.2)

## 2017-05-07 LAB — CBC WITH DIFFERENTIAL/PLATELET
BASOS ABS: 19 {cells}/uL (ref 0–200)
BASOS PCT: 0.3 %
EOS ABS: 180 {cells}/uL (ref 15–500)
Eosinophils Relative: 2.9 %
HEMATOCRIT: 41.6 % (ref 35.0–45.0)
HEMOGLOBIN: 14.2 g/dL (ref 11.7–15.5)
LYMPHS ABS: 1829 {cells}/uL (ref 850–3900)
MCH: 31.3 pg (ref 27.0–33.0)
MCHC: 34.1 g/dL (ref 32.0–36.0)
MCV: 91.6 fL (ref 80.0–100.0)
MPV: 10.8 fL (ref 7.5–12.5)
Monocytes Relative: 6.3 %
NEUTROS ABS: 3782 {cells}/uL (ref 1500–7800)
Neutrophils Relative %: 61 %
Platelets: 261 10*3/uL (ref 140–400)
RBC: 4.54 10*6/uL (ref 3.80–5.10)
RDW: 12.4 % (ref 11.0–15.0)
Total Lymphocyte: 29.5 %
WBC: 6.2 10*3/uL (ref 3.8–10.8)
WBCMIX: 391 {cells}/uL (ref 200–950)

## 2017-05-07 LAB — QUANTIFERON TB GOLD ASSAY (BLOOD)
MITOGEN-NIL SO: 5.3 [IU]/mL
QUANTIFERON NIL VALUE: 0.37 [IU]/mL
QUANTIFERON TB AG MINUS NIL: 0.06 [IU]/mL
QUANTIFERON(R)-TB GOLD: NEGATIVE

## 2017-05-07 NOTE — Telephone Encounter (Signed)
Patient returned Andrea's call.  CB#[phone].  Thank you.

## 2017-05-08 NOTE — Telephone Encounter (Signed)
Attempted to contact the patient and left message for patient to call the office.  

## 2017-05-10 HISTORY — PX: EXCISION MORTON'S NEUROMA: SHX5013

## 2017-05-12 NOTE — Progress Notes (Signed)
Office Visit Note  [name]             Date of Birth: 1965/08/04           MRN: [id number]             PCP: Rita Ohara, MD Referring: Rita Ohara, MD Visit Date: 05/26/2017 Occupation: '@GUAROCC' @    Subjective:  Other (day 9, no meds. Sugery scheduled for tomorrow )   History of Present Illness: Jill Mathews is a 51 y.o. female with history of sero positive rheumatoid arthritis. She is a scheduled to have right foot Morton's neuroma surgery tomorrow. She's been off methotrexate and Enbrel for 9 days now. She's having a flare with increased pain and discomfort in almost all of her joints. She's having severe pain in her left shoulder left elbow, left thumb, hip joints knee joints ankles and feet. She was doing very well on combination of methotrexate and Enbrel. She states with the last snowstorm she shoveled all this no. She's been hiking in dancing and doing all routine activities on the combination therapy.  Activities of Daily Living:  Patient reports morning stiffness for all day  Patient Denies nocturnal pain.  Difficulty dressing/grooming: Denies Difficulty climbing stairs: Reports Difficulty getting out of chair: Reports Difficulty using hands for taps, buttons, cutlery, and/or writing: Reports   Review of Systems  Constitutional: Positive for fatigue. Negative for night sweats, weight gain, weight loss and weakness.  HENT: Negative for mouth sores, trouble swallowing, trouble swallowing, mouth dryness and nose dryness.   Eyes: Negative for pain, redness, visual disturbance and dryness.  Respiratory: Negative for cough, shortness of breath and difficulty breathing.   Cardiovascular: Positive for hypertension. Negative for chest pain, palpitations, irregular heartbeat and swelling in legs/feet.  Gastrointestinal: Negative for blood in stool, constipation and diarrhea.  Endocrine: Negative for increased urination.  Genitourinary: Negative for vaginal  dryness.  Musculoskeletal: Positive for arthralgias, joint pain, joint swelling and morning stiffness. Negative for myalgias, muscle weakness, muscle tenderness and myalgias.  Skin: Negative for color change, rash, hair loss, skin tightness, ulcers and sensitivity to sunlight.  Allergic/Immunologic: Negative for susceptible to infections.  Neurological: Negative for dizziness, memory loss and night sweats.  Hematological: Negative for swollen glands.  Psychiatric/Behavioral: Negative for depressed mood and sleep disturbance. The patient is not nervous/anxious.     PMFS History:  Patient Active Problem List   Diagnosis Date Noted  . HLA B27 (HLA B27 positive) 10/24/2016  . Influenza 09/25/2015  . Strep pharyngitis 09/25/2015  . Prediabetes 10/31/2014  . Mixed hyperlipidemia 09/09/2012  . Hypothyroidism 05/25/2012  . Essential hypertension, benign 05/25/2012  . Rheumatoid arthritis involving multiple sites with positive rheumatoid factor ([place]) 05/25/2012  . Obesity (BMI 30-39.9) 05/25/2012    Past Medical History:  Diagnosis Date  . Hyperlipidemia    borderline; dietary management  . Hypertension   . Hypothyroidism 2001  . Rheumatoid arthritis(714.0) 04/2006   Dr. Estanislado Pandy manages    Family History  Problem Relation Age of Onset  . Heart disease Mother 97       MI, stents x 2; CABG age 32  . Stroke Mother 48  . Hyperlipidemia Mother   . Hypertension Mother   . Depression Mother   . Diabetes Mother   . Thyroid disease Mother        Graves disease, s/p RAI, now hypothyroid  . Irritable bowel syndrome Mother   . COPD Mother   . Colon polyps Mother   .  Cancer Mother        skin (not melanoma)  . Kidney Stones Mother   . Stroke Father 66  . Hyperlipidemia Father   . Hypertension Father   . Depression Father   . Diabetes Father   . Thyroid disease Father        hypothyroidism  . COPD Father   . Heart disease Father 76       MI; s/p CABG; pacemaker  . Cancer Sister  103       pancreatic cancer  . Bipolar disorder Sister   . Lupus Paternal Aunt   . Heart disease Maternal Uncle        s/p CABG, late 73's  . Cancer Maternal Grandfather        laryngeal, smoker   Past Surgical History:  Procedure Laterality Date  . PILONIDAL CYST EXCISION  1983  . TONSILLECTOMY  age 37  . WISDOM TOOTH EXTRACTION     Social History   Social History Narrative   Lives with female partner of 75 years--got married 06/11/13. 3 cats, 2 dogs, 5 chickens.      (had to slaughter chicken in order to isolate dog after incident with rabid fox getting into yard; one dog wasn't vaccinated--had to put down)     Objective: Vital Signs: BP 106/76 (BP Location: Right Arm, Patient Position: Sitting, Cuff Size: Normal)   Pulse 60   Resp (!) 21   Ht '5\' 7"'  (1.702 m)   Wt 250 lb (113.4 kg)   LMP 01/13/2013   BMI 39.16 kg/m    Physical Exam  Constitutional: She is oriented to person, place, and time. She appears well-developed and well-nourished.  HENT:  Head: Normocephalic and atraumatic.  Eyes: Conjunctivae and EOM are normal.  Neck: Normal range of motion.  Cardiovascular: Normal rate, regular rhythm, normal heart sounds and intact distal pulses.  Pulmonary/Chest: Effort normal and breath sounds normal.  Abdominal: Soft. Bowel sounds are normal.  Lymphadenopathy:    She has no cervical adenopathy.  Neurological: She is alert and oriented to person, place, and time.  Skin: Skin is warm and dry. Capillary refill takes less than 2 seconds.  Psychiatric: She has a normal mood and affect. Her behavior is normal.  Nursing note and vitals reviewed.    Musculoskeletal Exam: C-spine and thoracic lumbar spine good range of motion. Shoulder joint abduction is limited to 90. She is tenderness over several of her joints and positive synovitis as described below.  CDAI Exam: CDAI Homunculus Exam:   Tenderness:  RUE: glenohumeral LUE: glenohumeral and ulnohumeral and  radiohumeral Left hand: 1st MCP and 2nd MCP RLE: acetabulofemoral, tibiofemoral and tibiotalar LLE: acetabulofemoral, tibiofemoral and tibiotalar Left foot: 5th MTP  Swelling:  Left hand: 1st MCP and 2nd MCP  Joint Counts:  CDAI Tender Joint count: 7 CDAI Swollen Joint count: 2  Global Assessments:  Patient Global Assessment: 9 Provider Global Assessment: 9  CDAI Calculated Score: 27    Investigation: No additional findings.TB Gold: 05/05/2017 Negative  CBC Latest Ref Rng & Units 05/05/2017 12/26/2016 09/18/2016  WBC 3.8 - 10.8 Thousand/uL 6.2 8.3 5.1  Hemoglobin 11.7 - 15.5 g/dL 14.2 13.8 13.2  Hematocrit 35.0 - 45.0 % 41.6 42.3 40.1  Platelets 140 - 400 Thousand/uL 261 324 290   CMP Latest Ref Rng & Units 05/05/2017 12/26/2016 09/18/2016  Glucose 65 - 99 mg/dL 125(H) 86 82  BUN 7 - 25 mg/dL '20 24 18  ' Creatinine 0.50 - 1.05 mg/dL 0.82  0.83 0.93  Sodium 135 - 146 mmol/L 140 141 140  Potassium 3.5 - 5.3 mmol/L 3.7 4.7 4.1  Chloride 98 - 110 mmol/L 101 104 104  CO2 20 - 32 mmol/L 32 23 28  Calcium 8.6 - 10.4 mg/dL 9.3 9.5 9.5  Total Protein 6.1 - 8.1 g/dL 6.5 6.3 6.1  Total Bilirubin 0.2 - 1.2 mg/dL 0.5 0.3 0.6  Alkaline Phos 33 - 130 U/L - 57 53  AST 10 - 35 U/L '21 15 16  ' ALT 6 - 29 U/L 30(H) 19 20    Imaging: No results found.  Speciality Comments: No specialty comments available.    Procedures:  No procedures performed Allergies: Penicillins   Assessment / Plan:     Visit Diagnoses: Rheumatoid arthritis involving multiple sites with positive rheumatoid factor (HCC) - +RF,+ anti-CCP. She's having a severe flare with pain and discomfort in multiple joints. She's been off with medications for 9 days for surgery scheduled tomorrow. I've advised her to discuss with the surgeon and see if she can resume her medications week after surgery if there is no infection.  HLA B27 (HLA B27 positive)  High risk medication use - Enbrel 50 mg by mouth every week, methotrexate  0.8 ML subcutaneous every week, folic acid 2 mg by mouth daily . Her labs have been stable except for mild elevation of LFTs. We will continue to monitor her labs every 3 months.  Morton's neuroma of right foot: She is scheduled to have surgery tomorrow.  Prediabetes: According to patient her hemoglobin A1c's have been stable.  History of hypertension: Her blood pressure is well controlled.  History of obesity: She's been trying to exercise on regular basis except until recently with a flare.  History of hyperlipidemia  History of hypothyroidism    Orders: No orders of the defined types were placed in this encounter.  No orders of the defined types were placed in this encounter.   Face-to-face time spent with patient was 30 minutes.Greater than 50% of time was spent in counseling and coordination of care.  Follow-Up Instructions: Return in about 5 months (around 10/24/2017) for Rheumatoid arthritis.   Bo Merino, MD  Note - This record has been created using Editor, commissioning.  Chart creation errors have been sought, but may not always  have been located. Such creation errors do not reflect on  the standard of medical care.

## 2017-05-14 NOTE — Telephone Encounter (Signed)
Left message to advise patient of lab results and mychart message sent.

## 2017-05-26 ENCOUNTER — Encounter: Payer: Self-pay | Admitting: Rheumatology

## 2017-05-26 ENCOUNTER — Ambulatory Visit: Payer: BC Managed Care – PPO | Admitting: Rheumatology

## 2017-05-26 VITALS — BP 106/76 | HR 60 | Resp 21 | Ht 67.0 in | Wt 250.0 lb

## 2017-05-26 DIAGNOSIS — R7303 Prediabetes: Secondary | ICD-10-CM

## 2017-05-26 DIAGNOSIS — Z1589 Genetic susceptibility to other disease: Secondary | ICD-10-CM | POA: Diagnosis not present

## 2017-05-26 DIAGNOSIS — Z79899 Other long term (current) drug therapy: Secondary | ICD-10-CM | POA: Diagnosis not present

## 2017-05-26 DIAGNOSIS — M0579 Rheumatoid arthritis with rheumatoid factor of multiple sites without organ or systems involvement: Secondary | ICD-10-CM

## 2017-05-26 DIAGNOSIS — G5761 Lesion of plantar nerve, right lower limb: Secondary | ICD-10-CM | POA: Diagnosis not present

## 2017-05-26 DIAGNOSIS — Z8679 Personal history of other diseases of the circulatory system: Secondary | ICD-10-CM | POA: Diagnosis not present

## 2017-05-26 DIAGNOSIS — Z8639 Personal history of other endocrine, nutritional and metabolic disease: Secondary | ICD-10-CM

## 2017-05-26 NOTE — Patient Instructions (Signed)
Standing Labs We placed an order today for your standing lab work.    Please come back and get your standing labs in February and every 3 months  We have open lab Monday through Friday from 8:30-11:30 AM and 1:30-4 PM at the office of Dr. Cris Talavera.   The office is located at [address], [address], [place], [place] [address] No appointment is necessary.   Labs are drawn by [facility].  You may receive a bill from [facility] for your lab work. If you have any questions regarding directions or hours of operation,  please call [phone].    

## 2017-06-10 ENCOUNTER — Encounter (HOSPITAL_COMMUNITY): Payer: Self-pay | Admitting: Emergency Medicine

## 2017-06-10 ENCOUNTER — Ambulatory Visit (HOSPITAL_COMMUNITY)
Admission: EM | Admit: 2017-06-10 | Discharge: 2017-06-10 | Disposition: A | Discharge status: Home / Self Care | Payer: BC Managed Care – PPO | Attending: Family Medicine | Admitting: Family Medicine

## 2017-06-10 ENCOUNTER — Other Ambulatory Visit: Payer: Self-pay

## 2017-06-10 DIAGNOSIS — H5789 Other specified disorders of eye and adnexa: Secondary | ICD-10-CM | POA: Diagnosis not present

## 2017-06-10 MED ORDER — TOBRAMYCIN 0.3 % OP SOLN: 1.0000 [drp] | Freq: Four times a day (QID) | OPHTHALMIC | 0 refills | Status: DC

## 2017-06-10 NOTE — Discharge Instructions (Signed)
If not improving by tomorrow morning I recommend seeing and ophthalmologist. Do not wear your contacts until your eye feels completely normal.

## 2017-06-10 NOTE — ED Provider Notes (Signed)
[facility]   [id number] 06/10/17 Arrival Time: 1134  ASSESSMENT & PLAN:  1. Eye irritation     Meds ordered this encounter  Medications  . tobramycin (TOBREX) 0.3 % ophthalmic solution    Sig: Place 1 drop into the right eye every 6 (six) hours.    Dispense:  5 mL    Refill:  0   No foreign body or corneal abrasion seen with fluorescein. Will start antibiotic gtts tonight. If not improving by tomorrow morning she will see an ophthalmologist. Instructed to not wear contact lenses.  Reviewed expectations re: course of current medical issues. Questions answered. Outlined signs and symptoms indicating need for more acute intervention. Patient verbalized understanding. After Visit Summary given.   SUBJECTIVE:  Jill Mathews is a 52 y.o. female who presents with complaint of L eye irritation/discomfort. Awoke early this morning with foreign body sensation in L eye. Went back to sleep. Awoke and noticed eye very red. Watery drainage. No specific visual changes. Wears contact lenses but has not done so in the past 4-5 days. No injury reported. Flushed eye with contact solution; "helped a little." Foreign body sensation resolved. Now with light sensitivity. No n/v.  ROS: As per HPI.   OBJECTIVE:  Vitals:   06/10/17 1214  BP: (!) 153/90  Pulse: 64  Resp: 18  Temp: 98.6 F (37 C)  SpO2: 100%    General appearance: alert; no distress Eyes: PERRL, EOMI, Conjunctiva of L eye with diffuse injection; watery drainage, no ciliary flush; no fluorescein uptake in cornea; no corneal opacities Neck: supple Lungs: clear to auscultation bilaterally Heart: regular rate and rhythm Skin: warm and dry Psychological: alert and cooperative; normal mood and affect  Allergies  Allergen Reactions  . Penicillins Rash    Past Medical History:  Diagnosis Date  . Hyperlipidemia    borderline; dietary management  . Hypertension   . Hypothyroidism 2001  . Rheumatoid  arthritis(714.0) 04/2006   Dr. Corliss Skains manages   Social History   Socioeconomic History  . Marital status: Married    Spouse name: Not on file  . Number of children: Not on file  . Years of education: Not on file  . Highest education level: Not on file  Social Needs  . Financial resource strain: Not on file  . Food insecurity - worry: Not on file  . Food insecurity - inability: Not on file  . Transportation needs - medical: Not on file  . Transportation needs - non-medical: Not on file  Occupational History  . Occupation: teaches biology/ecology at [employer]    Employer: [employer]  Tobacco Use  . Smoking status: Former Smoker    Packs/day: 1.50    Years: 26.00    Pack years: 39.00    Types: Cigarettes    Start date: 01/08/2005    Last attempt to quit: 2006    Years since quitting: 13.0  . Smokeless tobacco: Former Neurosurgeon  . Tobacco comment: only when playing softball (2 years), when they couldn't smoke (age [age])  Substance and Sexual Activity  . Alcohol use: Yes    Comment: 1-2 drinks (beer) per week.  . Drug use: No  . Sexual activity: Yes    Partners: Female  Other Topics Concern  . Not on file  Social History Narrative   Lives with female partner of 58 years--got married 06/11/13. 3 cats, 2 dogs, 5 chickens.      (had to slaughter chicken in order to isolate  dog after incident with rabid fox getting into yard; one dog wasn't vaccinated--had to put down)   Family History  Problem Relation Age of Onset  . Heart disease Mother 23       MI, stents x 2; CABG age 47  . Stroke Mother 30  . Hyperlipidemia Mother   . Hypertension Mother   . Depression Mother   . Diabetes Mother   . Thyroid disease Mother        Graves disease, s/p RAI, now hypothyroid  . Irritable bowel syndrome Mother   . COPD Mother   . Colon polyps Mother   . Cancer Mother        skin (not melanoma)  . Kidney Stones Mother   . Stroke Father 40  .  Hyperlipidemia Father   . Hypertension Father   . Depression Father   . Diabetes Father   . Thyroid disease Father        hypothyroidism  . COPD Father   . Heart disease Father 27       MI; s/p CABG; pacemaker  . Cancer Sister 38       pancreatic cancer  . Bipolar disorder Sister   . Lupus Paternal Aunt   . Heart disease Maternal Uncle        s/p CABG, late 48's  . Cancer Maternal Grandfather        laryngeal, smoker   Past Surgical History:  Procedure Laterality Date  . PILONIDAL CYST EXCISION  1983  . TONSILLECTOMY  age 71  . WISDOM TOOTH EXTRACTION       Mardella Layman, MD 06/10/17 [phone]

## 2017-06-10 NOTE — ED Triage Notes (Signed)
Pt c/o L eye redness and drainage 

## 2017-06-27 ENCOUNTER — Other Ambulatory Visit: Payer: Self-pay | Admitting: Family Medicine

## 2017-08-11 ENCOUNTER — Other Ambulatory Visit: Payer: Self-pay | Admitting: Rheumatology

## 2017-08-11 NOTE — Telephone Encounter (Addendum)
Last Visit: 05/26/17 Next Visit: 10/29/17 Labs: 05/05/17 ALT mildly elevated Tb Gold: 05/05/17 Neg   Left message to advise patient she is due for labs.   Okay to refill 30 day supply per Dr. Corliss Skains

## 2017-08-28 ENCOUNTER — Other Ambulatory Visit: Payer: Self-pay | Admitting: Rheumatology

## 2017-08-28 NOTE — Telephone Encounter (Addendum)
Last Visit: 05/26/17 Next Visit: 10/29/17 Labs: 05/05/17 ALT mildly elevated Tb Gold: 05/05/17 Neg   Left message to advise patient she is due to update labs.  Okay to refill 30 day supply per Dr. Corliss Skains.

## 2017-10-07 ENCOUNTER — Other Ambulatory Visit: Payer: Self-pay

## 2017-10-07 DIAGNOSIS — Z79899 Other long term (current) drug therapy: Secondary | ICD-10-CM

## 2017-10-07 LAB — CBC WITH DIFFERENTIAL/PLATELET
Basophils Absolute: 22 cells/uL (ref 0–200)
Basophils Relative: 0.4 %
EOS PCT: 2.8 %
Eosinophils Absolute: 151 cells/uL (ref 15–500)
HEMATOCRIT: 41 % (ref 35.0–45.0)
Hemoglobin: 14 g/dL (ref 11.7–15.5)
LYMPHS ABS: 1809 {cells}/uL (ref 850–3900)
MCH: 31.7 pg (ref 27.0–33.0)
MCHC: 34.1 g/dL (ref 32.0–36.0)
MCV: 93 fL (ref 80.0–100.0)
MPV: 10.6 fL (ref 7.5–12.5)
Monocytes Relative: 10.5 %
NEUTROS ABS: 2851 {cells}/uL (ref 1500–7800)
NEUTROS PCT: 52.8 %
Platelets: 284 10*3/uL (ref 140–400)
RBC: 4.41 10*6/uL (ref 3.80–5.10)
RDW: 13 % (ref 11.0–15.0)
Total Lymphocyte: 33.5 %
WBC mixed population: 567 cells/uL (ref 200–950)
WBC: 5.4 10*3/uL (ref 3.8–10.8)

## 2017-10-08 LAB — COMPLETE METABOLIC PANEL WITH GFR
AG Ratio: 1.9 (calc) (ref 1.0–2.5)
ALKALINE PHOSPHATASE (APISO): 62 U/L (ref 33–130)
ALT: 26 U/L (ref 6–29)
AST: 18 U/L (ref 10–35)
Albumin: 4.2 g/dL (ref 3.6–5.1)
BUN: 22 mg/dL (ref 7–25)
CO2: 31 mmol/L (ref 20–32)
CREATININE: 1.01 mg/dL (ref 0.50–1.05)
Calcium: 9.8 mg/dL (ref 8.6–10.4)
Chloride: 102 mmol/L (ref 98–110)
GFR, EST NON AFRICAN AMERICAN: 64 mL/min/{1.73_m2} (ref >=60)
GFR, Est African American: 74 mL/min/{1.73_m2} (ref >=60)
Globulin: 2.2 g/dL (calc) (ref 1.9–3.7)
Glucose, Bld: 94 mg/dL (ref 65–99)
Potassium: 4.2 mmol/L (ref 3.5–5.3)
SODIUM: 142 mmol/L (ref 135–146)
Total Bilirubin: 0.4 mg/dL (ref 0.2–1.2)
Total Protein: 6.4 g/dL (ref 6.1–8.1)

## 2017-10-15 ENCOUNTER — Telehealth: Payer: Self-pay | Admitting: Rheumatology

## 2017-10-15 MED ORDER — ETANERCEPT 50 MG/ML ~~LOC~~ SOAJ: SUBCUTANEOUS | 0 refills | Status: DC

## 2017-10-15 NOTE — Telephone Encounter (Signed)
Patient requested prescription refill of Enbrel.  Patient's pharmacy is [facility].

## 2017-10-15 NOTE — Telephone Encounter (Signed)
Last Visit: 05/26/17 Next Visit: 10/29/17 Labs:10/07/17 wnl Tb Gold: 05/05/17 Neg   Okay to refill per Dr. Corliss Skains

## 2017-10-17 NOTE — Progress Notes (Signed)
Office Visit Note  [name]             Date of Birth: 1966/05/30           MRN: [id number]             PCP: Rita Ohara, MD Referring: Rita Ohara, MD Visit Date: 10/29/2017 Occupation: '@GUAROCC' @    Subjective:  Pain in multiple joints    History of Present Illness: Jill Mathews is a 52 y.o. 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  She injects methotrexate 0.6 mL subcutaneously every week, folic acid 2 mg daily, and Enbrel 50 mg subcutaneous injection weekly.  Patient reports that in November 2018 her dose of methotrexate was lowered from 0.8 mL once weekly to 0.6 mL once weekly due to elevated LFTs.  She states that in December she had surgery and was off of Enbrel and methotrexate for about 1 month.  After resuming both of her medications she continued to have increased pain and swelling in multiple joints.  She states her pain is most severe in her bilateral hands, bilateral shoulders, bilateral elbows.  She states that she has noticed some swelling in her hands.  She reports she has occasional discomfort in her bilateral ankles and knees.  She states that she will have some swelling in her knees if she standing for prolonged periods of time.  She states that on January 16 she is going on a 5-day wilderness canoe trip in [place] and would like to take prednisone with her in case she has a flare. She reports that she is concerned about her increased confusion and forgetfulness.  She states that about 3 years ago she started having the symptoms which she attributed to menopause.  She states that she has had several occurrences where she was has been looking for her car keys while driving her car.  She states she is also had occurrences where she gets lost in the woods even though she has been on the same trials for over 10 years.  She states that she is also been having increased muscle tenderness and muscle aches and is worried she  may have fibromyalgia.  She states that her mother has 5 myalgia and was diagnosed around the same age.   Activities of Daily Living:  Patient reports morning stiffness for 10 minutes.   Patient Reports nocturnal pain.  Difficulty dressing/grooming: Reports Difficulty climbing stairs: Reports Difficulty getting out of chair: Denies Difficulty using hands for taps, buttons, cutlery, and/or writing: Reports   Review of Systems  Constitutional: Positive for fatigue.  HENT: Negative for mouth sores, mouth dryness and nose dryness.   Eyes: Positive for dryness. Negative for pain and visual disturbance.  Respiratory: Negative for cough, hemoptysis, shortness of breath and difficulty breathing.   Cardiovascular: Negative for chest pain, palpitations, hypertension and swelling in legs/feet.  Gastrointestinal: Negative for abdominal pain, blood in stool, constipation and diarrhea.  Endocrine: Negative for increased urination.  Genitourinary: Negative for painful urination and pelvic pain.  Musculoskeletal: Positive for arthralgias, joint pain, joint swelling and morning stiffness. Negative for myalgias, muscle weakness, muscle tenderness and myalgias.  Skin: Negative for color change, pallor, rash, hair loss, nodules/bumps, skin tightness, ulcers and sensitivity to sunlight.  Allergic/Immunologic: Negative for susceptible to infections.  Neurological: Negative for dizziness, numbness and headaches.  Hematological: Negative for swollen glands.  Psychiatric/Behavioral: Negative for depressed mood and sleep disturbance. The patient is not nervous/anxious.  PMFS History:  Patient Active Problem List   Diagnosis Date Noted  . HLA B27 (HLA B27 positive) 10/24/2016  . Influenza 09/25/2015  . Strep pharyngitis 09/25/2015  . Prediabetes 10/31/2014  . Mixed hyperlipidemia 09/09/2012  . Hypothyroidism 05/25/2012  . Essential hypertension, benign 05/25/2012  . Rheumatoid arthritis involving  multiple sites with positive rheumatoid factor ([place]) 05/25/2012  . Obesity (BMI 30-39.9) 05/25/2012    Past Medical History:  Diagnosis Date  . Hyperlipidemia    borderline; dietary management  . Hypertension   . Hypothyroidism 2001  . Rheumatoid arthritis(714.0) 04/2006   Dr. Estanislado Pandy manages    Family History  Problem Relation Age of Onset  . Heart disease Mother 61       MI, stents x 2; CABG age 21  . Stroke Mother 66  . Hyperlipidemia Mother   . Hypertension Mother   . Depression Mother   . Diabetes Mother   . Thyroid disease Mother        Graves disease, s/p RAI, now hypothyroid  . Irritable bowel syndrome Mother   . COPD Mother   . Colon polyps Mother   . Cancer Mother        skin (not melanoma)  . Kidney Stones Mother   . Stroke Father 89  . Hyperlipidemia Father   . Hypertension Father   . Depression Father   . Diabetes Father   . Thyroid disease Father        hypothyroidism  . COPD Father   . Heart disease Father 49       MI; s/p CABG; pacemaker  . Cancer Sister 75       pancreatic cancer  . Bipolar disorder Sister   . Lupus Paternal Aunt   . Heart disease Maternal Uncle        s/p CABG, late 43's  . Cancer Maternal Grandfather        laryngeal, smoker   Past Surgical History:  Procedure Laterality Date  . 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 PILONIDAL CYST EXCISION  1983  . TONSILLECTOMY  age 32  . WISDOM TOOTH EXTRACTION     Social History   Social History Narrative   Lives with female partner of 35 years--got married 06/11/13. 3 cats, 2 dogs, 5 chickens.      (had to slaughter chicken in order to isolate dog after incident with rabid fox getting into yard; one dog wasn't vaccinated--had to put down)     Objective: Vital Signs: BP 128/85 (BP Location: Left Arm, Patient Position: Sitting, Cuff Size: Normal)   Pulse 64   Resp 16   Ht '5\' 7"'  (1.702 m)   Wt 255 lb (115.7 kg)   LMP 01/13/2013   BMI 39.94 kg/m    Physical  Exam  Constitutional: She is oriented to person, place, and time. She appears well-developed and well-nourished.  HENT:  Head: Normocephalic and atraumatic.  Eyes: Conjunctivae and EOM are normal.  Neck: Normal range of motion.  Cardiovascular: Normal rate, regular rhythm, normal heart sounds and intact distal pulses.  Pulmonary/Chest: Effort normal and breath sounds normal.  Abdominal: Soft. Bowel sounds are normal.  Lymphadenopathy:    She has no cervical adenopathy.  Neurological: She is alert and oriented to person, place, and time.  Skin: Skin is warm and dry. Capillary refill takes less than 2 seconds.  Psychiatric: She has a normal mood and affect. Her behavior is normal.  Nursing note and vitals reviewed.  Musculoskeletal Exam: C-spine, thoracic spine, lumbar spine good range of motion.  No midline spinal tenderness.  No SI joint tenderness.  Shoulder joints, elbow joints, wrist joints, MCPs, PIPs, DIPs good range of motion with no synovitis.  Hip joints, knee joints, ankle joints, MTPs, PIPs, DIPs good range of motion no synovitis.  She has some discomfort with bilateral hip range of motion. No warmth or effusion of knee joints.  She has tenderness of her left 5th MTP joint.   CDAI Exam: No CDAI exam completed.    Investigation: No additional findings.TB Gold: 05/05/2017 Negative  CBC Latest Ref Rng & Units 10/07/2017 05/05/2017 12/26/2016  WBC 3.8 - 10.8 Thousand/uL 5.4 6.2 8.3  Hemoglobin 11.7 - 15.5 g/dL 14.0 14.2 13.8  Hematocrit 35.0 - 45.0 % 41.0 41.6 42.3  Platelets 140 - 400 Thousand/uL 284 261 324   CMP Latest Ref Rng & Units 10/07/2017 05/05/2017 12/26/2016  Glucose 65 - 99 mg/dL 94 125(H) 86  BUN 7 - 25 mg/dL '22 20 24  ' Creatinine 0.50 - 1.05 mg/dL 1.01 0.82 0.83  Sodium 135 - 146 mmol/L 142 140 141  Potassium 3.5 - 5.3 mmol/L 4.2 3.7 4.7  Chloride 98 - 110 mmol/L 102 101 104  CO2 20 - 32 mmol/L 31 32 23  Calcium 8.6 - 10.4 mg/dL 9.8 9.3 9.5  Total Protein 6.1  - 8.1 g/dL 6.4 6.5 6.3  Total Bilirubin 0.2 - 1.2 mg/dL 0.4 0.5 0.3  Alkaline Phos 33 - 130 U/L - - 57  AST 10 - 35 U/L '18 21 15  ' ALT 6 - 29 U/L 26 30(H) 19    Imaging: No results found.  Speciality Comments: No specialty comments available.    Procedures:  No procedures performed Allergies: Penicillins   Assessment / Plan:     Visit Diagnoses: Rheumatoid arthritis involving multiple sites with positive rheumatoid factor (HCC) - +RF,+ anti-CCP: She has no active synovitis on exam today.  She has been having increased pain in multiple joints including her hands, elbows, shoulders, ankles, and left 5th toe.She has intermittent swelling in her hands and knee joints.  We will schedule an ultrasound of her bilateral hands to evaluate for synovitis.  X-rays of hands and feet were obtained today.  The patient will be going on a 5 taking a trip leaving on June 16 and would like to take a prednisone taper with her in case she has a flare.  A prednisone taper starting at 20 mg tapering by 5 mg every 4 days was sent to the pharmacy today.  HLA B27 (HLA B27 positive)  High risk medication use - Enbrel 50 mg by mouth every week, methotrexate 0.8 ML subcutaneous every week, folic acid 2 mg by mouth daily.  CBC and CMP are within normal limits on 10/07/2017.  She will return in July and every 3 months for CBC and CMP to monitor for drug toxicity.  TB gold will be due November 2019.  Morton's neuroma of right foot: She previously had surgery.   Other medical conditions are listed as follows:   History of hypertension  Prediabetes  History of obesity  History of hypothyroidism  History of hyperlipidemia    Orders: Orders Placed This Encounter  Procedures  . XR Hand 2 View Right  . XR Hand 2 View Left  . XR Foot 2 Views Right  . XR Foot 2 Views Left  . Ambulatory referral to Neurology   Meds ordered this encounter  Medications  .  predniSONE (DELTASONE) 5 MG tablet    Sig: Take 4  tablets by mouth for 4 days, 3 tablets for 4 days, 2 tablets for 4 days, 1 tablet for 4 days    Dispense:  40 tablet    Refill:  0    Face-to-face time spent with patient was 30 minutes. >50% of time was spent in counseling and coordination of care.  Follow-Up Instructions: Return in about 5 months (around 03/31/2018) for Rheumatoid arthritis.   Ofilia Neas, PA-C I examined and evaluated the patient with Hazel Sams PA.  Patient had no synovitis on my examination.  Although she has been continuing to have some arthralgias.  She does have mild osteoarthritic changes in her hands and feet.  X-rays obtained today did not show any radiographic progression.  She is concerned about the memory issues.  We will refer her to neurology.  The plan of care was discussed as noted above.  Bo Merino, MDNote - This record has been created using [employer].  Chart creation errors have been sought, but may not always  have been located. Such creation errors do not reflect on  the standard of medical care.

## 2017-10-28 ENCOUNTER — Other Ambulatory Visit: Payer: Self-pay | Admitting: Rheumatology

## 2017-10-28 ENCOUNTER — Telehealth: Payer: Self-pay

## 2017-10-28 NOTE — Telephone Encounter (Signed)
Received a fax from [facility] requesting a prior authorization for Enbrel. Authorization has been completed and faxed to [facility]. Will update once we have a response.   Will send document to scan center.  Mylik Pro, [place], CPhT 10:08 AM

## 2017-10-29 ENCOUNTER — Ambulatory Visit (INDEPENDENT_AMBULATORY_CARE_PROVIDER_SITE_OTHER): Payer: Self-pay

## 2017-10-29 ENCOUNTER — Encounter: Payer: Self-pay | Admitting: Rheumatology

## 2017-10-29 ENCOUNTER — Ambulatory Visit: Payer: BC Managed Care – PPO | Admitting: Rheumatology

## 2017-10-29 VITALS — BP 128/85 | HR 64 | Resp 16 | Ht 67.0 in | Wt 255.0 lb

## 2017-10-29 DIAGNOSIS — Z8679 Personal history of other diseases of the circulatory system: Secondary | ICD-10-CM | POA: Diagnosis not present

## 2017-10-29 DIAGNOSIS — Z79899 Other long term (current) drug therapy: Secondary | ICD-10-CM

## 2017-10-29 DIAGNOSIS — Z1589 Genetic susceptibility to other disease: Secondary | ICD-10-CM

## 2017-10-29 DIAGNOSIS — M79641 Pain in right hand: Secondary | ICD-10-CM | POA: Diagnosis not present

## 2017-10-29 DIAGNOSIS — M0579 Rheumatoid arthritis with rheumatoid factor of multiple sites without organ or systems involvement: Secondary | ICD-10-CM | POA: Diagnosis not present

## 2017-10-29 DIAGNOSIS — M79642 Pain in left hand: Secondary | ICD-10-CM

## 2017-10-29 DIAGNOSIS — G5761 Lesion of plantar nerve, right lower limb: Secondary | ICD-10-CM

## 2017-10-29 DIAGNOSIS — R7303 Prediabetes: Secondary | ICD-10-CM | POA: Diagnosis not present

## 2017-10-29 DIAGNOSIS — M79671 Pain in right foot: Secondary | ICD-10-CM

## 2017-10-29 DIAGNOSIS — R413 Other amnesia: Secondary | ICD-10-CM | POA: Diagnosis not present

## 2017-10-29 DIAGNOSIS — Z8639 Personal history of other endocrine, nutritional and metabolic disease: Secondary | ICD-10-CM

## 2017-10-29 DIAGNOSIS — M79672 Pain in left foot: Secondary | ICD-10-CM | POA: Diagnosis not present

## 2017-10-29 MED ORDER — PREDNISONE 5 MG PO TABS: ORAL_TABLET | ORAL | 0 refills | Status: DC

## 2017-10-29 NOTE — Patient Instructions (Addendum)
Standing Labs We placed an order today for your standing lab work.    Please come back and get your standing labs in July and every 3 months   CBC, CMP, Sed rate, CCP  We have open lab Monday through Friday from 8:30-11:30 AM and 1:30-4:00 PM  at the office of Dr. Pollyann Savoy.   You may experience shorter wait times on Monday and Friday afternoons. The office is located at [address], [address], [place], [place] [address] No appointment is necessary.   Labs are drawn by [facility].  You may receive a bill from [facility] for your lab work. If you have any questions regarding directions or hours of operation,  please call [phone].

## 2017-10-29 NOTE — Telephone Encounter (Signed)
Received a fax from [facility] regarding a prior authorization approval for ENBREL from 10/28/2017 to 10/29/2019.   Reference number: [id number] Phone number: NONE  Will send document to scan center.  Called pt to update. Left message.  Burnetta Kohls, [place], CPhT 8:54 AM

## 2017-10-30 NOTE — Telephone Encounter (Signed)
Last visit: 10/29/2017 Next visit: 12/24/2017 Labs: 10/07/2017 WNL   Okay to refill per Dr. Corliss Skains.

## 2017-10-31 ENCOUNTER — Other Ambulatory Visit: Payer: Self-pay | Admitting: *Deleted

## 2017-10-31 MED ORDER — FOLIC ACID-VIT B6-VIT B12 2.2-25-1 MG PO TABS: 1.0000 | ORAL_TABLET | Freq: Every day | ORAL | 4 refills | Status: DC

## 2017-10-31 NOTE — Telephone Encounter (Signed)
Refill request received via fax  Last Visit: 10/29/17 Next Visit: 12/24/17  Okay to refill per Dr. Corliss Skains

## 2017-11-10 ENCOUNTER — Encounter: Payer: BC Managed Care – PPO | Admitting: Family Medicine

## 2017-12-16 NOTE — Progress Notes (Deleted)
Office Visit Note  [name]             Date of Birth: May 11, 1966           MRN: [id number]             PCP: Rita Ohara, MD Referring: Rita Ohara, MD Visit Date: 12/24/2017 Occupation: '@GUAROCC'$ @    Subjective:  No chief complaint on file.   History of Present Illness: Jill Mathews is a 52 y.o. female ***   Activities of Daily Living:  Patient reports morning stiffness for *** {minute/hour:19697}.   Patient {ACTIONS;DENIES/REPORTS:21021675::"Denies"} nocturnal pain.  Difficulty dressing/grooming: {ACTIONS;DENIES/REPORTS:21021675::"Denies"} Difficulty climbing stairs: {ACTIONS;DENIES/REPORTS:21021675::"Denies"} Difficulty getting out of chair: {ACTIONS;DENIES/REPORTS:21021675::"Denies"} Difficulty using hands for taps, buttons, cutlery, and/or writing: {ACTIONS;DENIES/REPORTS:21021675::"Denies"}   No Rheumatology ROS completed.   PMFS History:  Patient Active Problem List   Diagnosis Date Noted  . HLA B27 (HLA B27 positive) 10/24/2016  . Influenza 09/25/2015  . Strep pharyngitis 09/25/2015  . Prediabetes 10/31/2014  . Mixed hyperlipidemia 09/09/2012  . Hypothyroidism 05/25/2012  . Essential hypertension, benign 05/25/2012  . Rheumatoid arthritis involving multiple sites with positive rheumatoid factor ([place]) 05/25/2012  . Obesity (BMI 30-39.9) 05/25/2012    Past Medical History:  Diagnosis Date  . Hyperlipidemia    borderline; dietary management  . Hypertension   . Hypothyroidism 2001  . Rheumatoid arthritis(714.0) 04/2006   Dr. Estanislado Pandy manages    Family History  Problem Relation Age of Onset  . Heart disease Mother 88       MI, stents x 2; CABG age 60  . Stroke Mother 46  . Hyperlipidemia Mother   . Hypertension Mother   . Depression Mother   . Diabetes Mother   . Thyroid disease Mother        Graves disease, s/p RAI, now hypothyroid  . Irritable bowel syndrome Mother   . COPD Mother   . Colon polyps Mother   . Cancer Mother       skin (not melanoma)  . Kidney Stones Mother   . Stroke Father 44  . Hyperlipidemia Father   . Hypertension Father   . Depression Father   . Diabetes Father   . Thyroid disease Father        hypothyroidism  . COPD Father   . Heart disease Father 59       MI; s/p CABG; pacemaker  . Cancer Sister 6       pancreatic cancer  . Bipolar disorder Sister   . Lupus Paternal Aunt   . Heart disease Maternal Uncle        s/p CABG, late 83's  . Cancer Maternal Grandfather        laryngeal, smoker   Past Surgical History:  Procedure Laterality Date  . 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 PILONIDAL CYST EXCISION  1983  . TONSILLECTOMY  age 17  . WISDOM TOOTH EXTRACTION     Social History   Social History Narrative   Lives with female partner of 27 years--got married 06/11/13. 3 cats, 2 dogs, 5 chickens.      (had to slaughter chicken in order to isolate dog after incident with rabid fox getting into yard; one dog wasn't vaccinated--had to put down)     Objective: Vital Signs: LMP 01/13/2013    Physical Exam   Musculoskeletal Exam: ***  CDAI Exam: No CDAI exam completed.    Investigation: No additional findings.   Imaging: No results found.  Speciality Comments: No specialty comments available.    Procedures:  No procedures performed Allergies: Penicillins   Assessment / Plan:     Visit Diagnoses: No diagnosis found.    Orders: No orders of the defined types were placed in this encounter.  No orders of the defined types were placed in this encounter.   Face-to-face time spent with patient was *** minutes. Greater than 50% of time was spent in counseling and coordination of care.  Follow-Up Instructions: No follow-ups on file.   Bo Merino, MD  Note - This record has been created using [employer].  Chart creation errors have been sought, but may not always  have been located. Such creation errors do not reflect on  the  standard of medical care.

## 2017-12-24 ENCOUNTER — Ambulatory Visit (INDEPENDENT_AMBULATORY_CARE_PROVIDER_SITE_OTHER): Payer: Self-pay

## 2017-12-24 ENCOUNTER — Ambulatory Visit: Payer: BC Managed Care – PPO | Admitting: Rheumatology

## 2017-12-24 DIAGNOSIS — M79642 Pain in left hand: Secondary | ICD-10-CM

## 2017-12-24 DIAGNOSIS — M79641 Pain in right hand: Secondary | ICD-10-CM

## 2017-12-24 DIAGNOSIS — M0579 Rheumatoid arthritis with rheumatoid factor of multiple sites without organ or systems involvement: Secondary | ICD-10-CM | POA: Diagnosis not present

## 2018-01-26 ENCOUNTER — Other Ambulatory Visit: Payer: Self-pay | Admitting: Family Medicine

## 2018-01-26 DIAGNOSIS — I10 Essential (primary) hypertension: Secondary | ICD-10-CM

## 2018-01-26 NOTE — Telephone Encounter (Signed)
Gave 30 days worth, needs appt ASAP.

## 2018-01-29 ENCOUNTER — Ambulatory Visit: Payer: BC Managed Care – PPO | Admitting: Family Medicine

## 2018-01-29 ENCOUNTER — Encounter: Payer: Self-pay | Admitting: Family Medicine

## 2018-01-29 VITALS — BP 128/74 | HR 65 | Temp 97.9°F | Wt 257.4 lb

## 2018-01-29 DIAGNOSIS — J209 Acute bronchitis, unspecified: Secondary | ICD-10-CM | POA: Diagnosis not present

## 2018-01-29 DIAGNOSIS — M0579 Rheumatoid arthritis with rheumatoid factor of multiple sites without organ or systems involvement: Secondary | ICD-10-CM

## 2018-01-29 MED ORDER — BENZONATATE 100 MG PO CAPS: 200.0000 mg | ORAL_CAPSULE | Freq: Three times a day (TID) | ORAL | 0 refills | Status: DC | PRN

## 2018-01-29 MED ORDER — CLARITHROMYCIN 500 MG PO TABS: 500.0000 mg | ORAL_TABLET | Freq: Two times a day (BID) | ORAL | 0 refills | Status: DC

## 2018-01-29 NOTE — Progress Notes (Signed)
   Subjective:    Patient ID: Jill Mathews, female    DOB: 09/16/1965, 52 y.o.   MRN: [id number]  HPI She complains of a 3-week history of rhinorrhea, dry cough, slight sore throat no fever no chills.  Prior to that she did have a flareup of her underlying rheumatoid arthritis and did use steroids for a short period of time as well as continue on her Enbrel and MTX.   Review of Systems     Objective:   Physical Exam Alert and in no distress. Tympanic membranes and canals are normal. Pharyngeal area is normal. Neck is supple without adenopathy or thyromegaly. Cardiac exam shows a regular sinus rhythm without murmurs or gallops. Lungs are clear to auscultation.        Assessment & Plan:  Acute bronchitis, unspecified organism - Plan: clarithromycin (BIAXIN) 500 MG tablet, benzonatate (TESSALON PERLES) 100 MG capsule  Rheumatoid arthritis involving multiple sites with positive rheumatoid factor (HCC) She will call me if not entirely better when she finishes the antibiotic.

## 2018-02-02 ENCOUNTER — Other Ambulatory Visit: Payer: Self-pay | Admitting: Rheumatology

## 2018-02-02 NOTE — Telephone Encounter (Addendum)
Last Visit: 10/29/17 Next Visit: 03/31/18 Labs: 10/07/17 WNL TB Gold: 05/05/17 Neg    Left message for patient to advise she is due for labs.  Okay to refill 30 day supply per Dr. Corliss Skains

## 2018-02-03 ENCOUNTER — Telehealth: Payer: Self-pay | Admitting: Rheumatology

## 2018-02-03 NOTE — Telephone Encounter (Signed)
Patient called returning your call and to let you know that she will come in on Friday, 8/30 in the afternoon to have her labwork done.

## 2018-02-06 ENCOUNTER — Other Ambulatory Visit: Payer: Self-pay

## 2018-02-06 DIAGNOSIS — Z79899 Other long term (current) drug therapy: Secondary | ICD-10-CM

## 2018-02-06 LAB — CBC WITH DIFFERENTIAL/PLATELET
BASOS PCT: 0.6 %
Basophils Absolute: 30 cells/uL (ref 0–200)
EOS PCT: 5 %
Eosinophils Absolute: 250 cells/uL (ref 15–500)
HCT: 41.7 % (ref 35.0–45.0)
HEMOGLOBIN: 13.7 g/dL (ref 11.7–15.5)
LYMPHS ABS: 2065 {cells}/uL (ref 850–3900)
MCH: 30.7 pg (ref 27.0–33.0)
MCHC: 32.9 g/dL (ref 32.0–36.0)
MCV: 93.5 fL (ref 80.0–100.0)
MPV: 10.7 fL (ref 7.5–12.5)
Monocytes Relative: 8 %
NEUTROS ABS: 2255 {cells}/uL (ref 1500–7800)
Neutrophils Relative %: 45.1 %
Platelets: 298 10*3/uL (ref 140–400)
RBC: 4.46 10*6/uL (ref 3.80–5.10)
RDW: 13.3 % (ref 11.0–15.0)
Total Lymphocyte: 41.3 %
WBC: 5 10*3/uL (ref 3.8–10.8)
WBCMIX: 400 {cells}/uL (ref 200–950)

## 2018-02-06 LAB — COMPLETE METABOLIC PANEL WITH GFR
AG RATIO: 1.8 (calc) (ref 1.0–2.5)
ALBUMIN MSPROF: 4 g/dL (ref 3.6–5.1)
ALT: 28 U/L (ref 6–29)
AST: 23 U/L (ref 10–35)
Alkaline phosphatase (APISO): 57 U/L (ref 33–130)
BUN: 17 mg/dL (ref 7–25)
CALCIUM: 9.4 mg/dL (ref 8.6–10.4)
CO2: 29 mmol/L (ref 20–32)
Chloride: 103 mmol/L (ref 98–110)
Creat: 0.91 mg/dL (ref 0.50–1.05)
GFR, EST AFRICAN AMERICAN: 83 mL/min/{1.73_m2} (ref >=60)
GFR, EST NON AFRICAN AMERICAN: 72 mL/min/{1.73_m2} (ref >=60)
GLOBULIN: 2.2 g/dL (ref 1.9–3.7)
Glucose, Bld: 110 mg/dL — ABNORMAL HIGH (ref 65–99)
POTASSIUM: 4 mmol/L (ref 3.5–5.3)
SODIUM: 139 mmol/L (ref 135–146)
Total Bilirubin: 0.7 mg/dL (ref 0.2–1.2)
Total Protein: 6.2 g/dL (ref 6.1–8.1)

## 2018-02-23 ENCOUNTER — Other Ambulatory Visit: Payer: Self-pay | Admitting: Family Medicine

## 2018-02-23 DIAGNOSIS — I10 Essential (primary) hypertension: Secondary | ICD-10-CM

## 2018-02-24 NOTE — Telephone Encounter (Signed)
Called pt to make an appt but no answer. LVM KH

## 2018-02-25 ENCOUNTER — Telehealth: Payer: Self-pay

## 2018-02-25 NOTE — Telephone Encounter (Signed)
Left another message to please call and schedule appt in addition to the two I left Monday.

## 2018-02-25 NOTE — Telephone Encounter (Signed)
Pt wants to know if she can see another provider to have her med check done because she can only come on a Friday. Please advise.

## 2018-02-25 NOTE — Telephone Encounter (Signed)
Since I am not here on Fridays, if that is the only day she can come, that is fine with me.

## 2018-02-26 NOTE — Telephone Encounter (Signed)
She scheduled with Vickie for next month

## 2018-02-26 NOTE — Telephone Encounter (Signed)
Do you want me to refill these for 30 more days? I called her and left a message this morning that it was okay for her to see another provider and to please call back and schedule today so we could refill these?

## 2018-03-01 ENCOUNTER — Other Ambulatory Visit: Payer: Self-pay | Admitting: Rheumatology

## 2018-03-01 ENCOUNTER — Other Ambulatory Visit: Payer: Self-pay | Admitting: Family Medicine

## 2018-03-02 NOTE — Telephone Encounter (Signed)
Last Visit: 10/29/17 Next Visit: 03/31/18 Labs: 02/06/18 Glucose is 110. All other labs are WNL.  Okay to refill per Dr. Corliss Skains

## 2018-03-02 NOTE — Telephone Encounter (Signed)
Pt has been scheduled to see another provider per her PCP.

## 2018-03-17 NOTE — Progress Notes (Signed)
Office Visit Note  [name]             Date of Birth: [birth year]           MRN: [id number]             PCP: Rita Ohara, MD Referring: Rita Ohara, MD Visit Date: 03/31/2018 Occupation: '@GUAROCC' @  Subjective:  Medication management   History of Present Illness: Jill Mathews is a 52 y.o. female with history of seropositive rheumatoid arthritis.  She states she is doing quite well on combination of Enbrel and methotrexate.  The dose of methotrexate was reduced to 0.6 mL subcu weekly due to elevation in creatinine.  She states she has intermittent increased joint pain with the weather change.  Currently she is doing well without any joint pain or joint swelling.  The right foot Morton's neuroma has resolved after surgery.  Activities of Daily Living:  Patient reports morning stiffness for 0 none.   Patient Reports nocturnal pain.  Difficulty dressing/grooming: Denies Difficulty climbing stairs: Denies Difficulty getting out of chair: Denies Difficulty using hands for taps, buttons, cutlery, and/or writing: Denies  Review of Systems  Constitutional: Positive for fatigue. Negative for night sweats, weight gain and weight loss.  HENT: Negative for mouth sores, trouble swallowing, trouble swallowing, mouth dryness and nose dryness.   Eyes: Positive for dryness. Negative for pain, redness and visual disturbance.  Respiratory: Negative for cough, shortness of breath and difficulty breathing.   Cardiovascular: Negative for chest pain, palpitations, hypertension, irregular heartbeat and swelling in legs/feet.  Gastrointestinal: Negative for blood in stool, constipation and diarrhea.  Endocrine: Negative for increased urination.  Genitourinary: Negative for difficulty urinating and vaginal dryness.  Musculoskeletal: Positive for arthralgias, gait problem, joint pain and muscle tenderness. Negative for joint swelling, myalgias, muscle weakness, morning stiffness and  myalgias.  Skin: Negative for color change, rash, hair loss, skin tightness, ulcers and sensitivity to sunlight.  Allergic/Immunologic: Negative for susceptible to infections.  Neurological: Negative for dizziness, numbness, memory loss, night sweats and weakness.  Hematological: Negative for bruising/bleeding tendency and swollen glands.  Psychiatric/Behavioral: Negative for depressed mood and sleep disturbance. The patient is not nervous/anxious.     PMFS History:  Patient Active Problem List   Diagnosis Date Noted  . HLA B27 (HLA B27 positive) 10/24/2016  . Prediabetes 10/31/2014  . Mixed hyperlipidemia 09/09/2012  . Hypothyroidism 05/25/2012  . Essential hypertension, benign 05/25/2012  . Rheumatoid arthritis involving multiple sites with positive rheumatoid factor ([place]) 05/25/2012  . Obesity (BMI 30-39.9) 05/25/2012    Past Medical History:  Diagnosis Date  . Hyperlipidemia    borderline; dietary management  . Hypertension   . Hypothyroidism 2001  . Rheumatoid arthritis(714.0) 04/2006   Dr. Estanislado Pandy manages    Family History  Problem Relation Age of Onset  . Heart disease Mother 28       MI, stents x 2; CABG age 32  . Stroke Mother 71  . Hyperlipidemia Mother   . Hypertension Mother   . Depression Mother   . Diabetes Mother   . Thyroid disease Mother        Graves disease, s/p RAI, now hypothyroid  . Irritable bowel syndrome Mother   . COPD Mother   . Colon polyps Mother   . Cancer Mother        skin (not melanoma)  . Kidney Stones Mother   . Stroke Father 40  . Hyperlipidemia Father   . Hypertension Father   .  Depression Father   . Diabetes Father   . Thyroid disease Father        hypothyroidism  . COPD Father   . Heart disease Father 60       MI; s/p CABG; pacemaker  . Cancer Sister 62       pancreatic cancer  . Bipolar disorder Sister   . Lupus Paternal Aunt   . Heart disease Maternal Uncle        s/p CABG, late 77's  . Cancer Maternal Grandfather         laryngeal, smoker   Past Surgical History:  Procedure Laterality Date  . 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 PILONIDAL CYST EXCISION  1983  . TONSILLECTOMY  age 58  . WISDOM TOOTH EXTRACTION     Social History   Social History Narrative   Lives with female partner of 15 years--got married 06/11/13. 3 cats, 2 dogs, 5 chickens.      (had to slaughter chicken in order to isolate dog after incident with rabid fox getting into yard; one dog wasn't vaccinated--had to put down)   Immunization History  Administered Date(s) Administered  . Influenza Split 03/10/2012  . Pneumococcal Conjugate-13 11/01/2015  . Pneumococcal Polysaccharide-23 07/02/2006  . Tdap 09/09/2012   Objective: Vital Signs: BP 123/77 (BP Location: Left Arm, Patient Position: Sitting, Cuff Size: Normal)   Pulse 76   Resp 16   Ht 5' 7.5" (1.715 m)   Wt 249 lb 6.4 oz (113.1 kg)   LMP 01/13/2013   BMI 38.49 kg/m    Physical Exam  Constitutional: She is oriented to person, place, and time. She appears well-developed and well-nourished.  HENT:  Head: Normocephalic and atraumatic.  Eyes: Conjunctivae and EOM are normal.  Neck: Normal range of motion.  Cardiovascular: Normal rate, regular rhythm, normal heart sounds and intact distal pulses.  Pulmonary/Chest: Effort normal and breath sounds normal.  Abdominal: Soft. Bowel sounds are normal.  Lymphadenopathy:    She has no cervical adenopathy.  Neurological: She is alert and oriented to person, place, and time.  Skin: Skin is warm and dry. Capillary refill takes less than 2 seconds.  Psychiatric: She has a normal mood and affect. Her behavior is normal.  Nursing note and vitals reviewed.    Musculoskeletal Exam: C-spine thoracic lumbar spine good range of motion.  Shoulder joints elbow joints wrist joint MCPs PIPs DIPs but good range of motion with no synovitis.  Hip joints knee joints ankles MTPs PIPs DIPs were good range of motion with no  synovitis.  CDAI Exam: CDAI Score: 0.2  Patient Global Assessment: 1 (mm); Provider Global Assessment: 1 (mm) Swollen: 0 ; Tender: 0  Joint Exam   Not documented   There is currently no information documented on the homunculus. Go to the Rheumatology activity and complete the homunculus joint exam.  Investigation: No additional findings.  Imaging: No results found.  Recent Labs: TB Gold negative 05/05/17 Lab Results  Component Value Date   WBC 7.1 03/20/2018   HGB 14.7 03/20/2018   PLT 304 03/20/2018   NA 143 03/20/2018   K 4.2 03/20/2018   CL 100 03/20/2018   CO2 27 03/20/2018   GLUCOSE 101 (H) 03/20/2018   BUN 21 03/20/2018   CREATININE 1.10 (H) 03/20/2018   BILITOT 0.3 03/20/2018   ALKPHOS 72 03/20/2018   AST 22 03/20/2018   ALT 27 03/20/2018   PROT 7.1 03/20/2018   ALBUMIN 4.6 03/20/2018  CALCIUM 9.9 03/20/2018   GFRAA 66 03/20/2018   QFTBGOLD NEGATIVE 05/05/2017    Speciality Comments: No specialty comments available.  Procedures:  No procedures performed Allergies: Penicillins   Assessment / Plan:     Visit Diagnoses: Rheumatoid arthritis involving multiple sites with positive rheumatoid factor (HCC) - +RF,+ anti-CCP.  Patient is clinically doing well without any synovitis on examination.  She states she had mild flare since the last visit which lasted only for couple of days.  She related to colder weather.  High risk medication use - Enbrel 50 mg sq q wk, MTX 0.6 ml sq q wk, folic acid 1 mg po qd. TB gold negative on 05/05/17. - Plan: QuantiFERON-TB Gold Plus with next labs.  HLA B27 (HLA B27 positive)  History of hyperlipidemia-weight loss diet and exercise was discussed.  History of obesity-exercise was encouraged and dietary modifications were discussed.  History of hypertension-her blood pressure is controlled today.  History of hypothyroidism  Prediabetes   Due for Pneumovax 23 as last vaccine was given in 2008.  Last TB gold on  05/05/17.  Future order placed for January.  Orders: Orders Placed This Encounter  Procedures  . QuantiFERON-TB Gold Plus   No orders of the defined types were placed in this encounter.     Follow-Up Instructions: Return in about 5 months (around 08/30/2018) for Rheumatoid arthritis.   Bo Merino, MD  Note - This record has been created using Editor, commissioning.  Chart creation errors have been sought, but may not always  have been located. Such creation errors do not reflect on  the standard of medical care.

## 2018-03-20 ENCOUNTER — Ambulatory Visit: Payer: BC Managed Care – PPO | Admitting: Family Medicine

## 2018-03-20 ENCOUNTER — Encounter: Payer: Self-pay | Admitting: Family Medicine

## 2018-03-20 VITALS — BP 120/82 | HR 71 | Wt 255.8 lb

## 2018-03-20 DIAGNOSIS — M0579 Rheumatoid arthritis with rheumatoid factor of multiple sites without organ or systems involvement: Secondary | ICD-10-CM | POA: Diagnosis not present

## 2018-03-20 DIAGNOSIS — E039 Hypothyroidism, unspecified: Secondary | ICD-10-CM | POA: Diagnosis not present

## 2018-03-20 DIAGNOSIS — I10 Essential (primary) hypertension: Secondary | ICD-10-CM

## 2018-03-20 DIAGNOSIS — E669 Obesity, unspecified: Secondary | ICD-10-CM

## 2018-03-20 DIAGNOSIS — R7303 Prediabetes: Secondary | ICD-10-CM | POA: Diagnosis not present

## 2018-03-20 DIAGNOSIS — E782 Mixed hyperlipidemia: Secondary | ICD-10-CM

## 2018-03-20 NOTE — Progress Notes (Signed)
   Subjective:    Patient ID: Jill Mathews, female    DOB: 1965/06/23, 52 y.o.   MRN: [id number]  HPI Chief Complaint  Patient presents with  . med check    med check,  flu shot going to be given through school   She is a patient of Dr. Delford Field who is here for a medication check with me due to conflict in her schedule.  No new concerns or complaints today. States she just needs to get her medications refilled.  Followed by Dr. Corliss Skains for RA and treated with enbrel and methotrexate.  States she is taking multiple supplements because of these medications.   Hypothyroidism- reports taking daily Synthroid on empty stomach.    HTN- states her BP is well controlled. BP at home has been in the 120s/70s. Reports good medication compliance and no side effects.  Taking aspirin 81 mg  States she has gained several pounds over the summer since not exercising. She is planning to start the Keto diet   Teaches at [employer].   Denies fever, chills, dizziness, chest pain, palpitations, shortness of breath, abdominal pain, N/V/D, urinary symptoms, LE edema.   Reviewed allergies, medications, past medical, surgical, family, and social history.   Review of Systems Pertinent positives and negatives in the history of present illness.     Objective:   Physical Exam  Constitutional: She is oriented to person, place, and time. She appears well-developed and well-nourished. No distress.  HENT:  Mouth/Throat: Oropharynx is clear and moist.  Eyes: Conjunctivae are normal.  Neck: Normal range of motion.  Pulmonary/Chest: Effort normal.  Neurological: She is alert and oriented to person, place, and time.  Skin: Skin is warm and dry.  Psychiatric: She has a normal mood and affect. Her behavior is normal. Thought content normal.   BP 120/82   Pulse 71   Wt 255 lb 12.8 oz (116 kg)   LMP 01/13/2013   BMI 40.06 kg/m       Assessment & Plan:  Hypothyroidism, unspecified  type - Plan: TSH  Essential hypertension, benign - Plan: CBC with Differential/Platelet, Comprehensive metabolic panel  Rheumatoid arthritis involving multiple sites with positive rheumatoid factor (HCC)  Prediabetes - Plan: CBC with Differential/Platelet, Comprehensive metabolic panel, Hemoglobin A1c  Mixed hyperlipidemia - Plan: Lipid panel  Obesity (BMI 30-39.9)  Reports good daily compliance on all medications for HTN, hypothyroidism without any known side effects.  BP is in goal range- refill medications and continue. Counseling on healthy diet and exercise and she became visibly irritated about this and states she does not like to come to the doctor's office because of the lecture about weight.   She is aware of potential long term health consequences of obesity and has a plan to start on keto diet.  History of prediabetes and will check Hgb A1c.  Check lipids. Continue on omega 3 fatty acid.  Follow up pending labs. Will need Synthroid refilled as appropriate.  Follow up with her PCP in future for CPE and medication check.

## 2018-03-22 ENCOUNTER — Other Ambulatory Visit: Payer: Self-pay | Admitting: Family Medicine

## 2018-03-22 LAB — COMPREHENSIVE METABOLIC PANEL
ALBUMIN: 4.6 g/dL (ref 3.5–5.5)
ALK PHOS: 72 IU/L (ref 39–117)
ALT: 27 IU/L (ref 0–32)
AST: 22 IU/L (ref 0–40)
Albumin/Globulin Ratio: 1.8 (ref 1.2–2.2)
BUN/Creatinine Ratio: 19 (ref 9–23)
BUN: 21 mg/dL (ref 6–24)
Bilirubin Total: 0.3 mg/dL (ref 0.0–1.2)
CO2: 27 mmol/L (ref 20–29)
CREATININE: 1.1 mg/dL — AB (ref 0.57–1.00)
Calcium: 9.9 mg/dL (ref 8.7–10.2)
Chloride: 100 mmol/L (ref 96–106)
GFR calc Af Amer: 66 mL/min/{1.73_m2} (ref >59)
GFR calc non Af Amer: 57 mL/min/{1.73_m2} — ABNORMAL LOW (ref >59)
Globulin, Total: 2.5 g/dL (ref 1.5–4.5)
Glucose: 101 mg/dL — ABNORMAL HIGH (ref 65–99)
Potassium: 4.2 mmol/L (ref 3.5–5.2)
Sodium: 143 mmol/L (ref 134–144)
Total Protein: 7.1 g/dL (ref 6.0–8.5)

## 2018-03-22 LAB — LIPID PANEL
CHOLESTEROL TOTAL: 249 mg/dL — AB (ref 100–199)
Chol/HDL Ratio: 5 ratio — ABNORMAL HIGH (ref 0.0–4.4)
HDL: 50 mg/dL (ref >39)
LDL Calculated: 159 mg/dL — ABNORMAL HIGH (ref 0–99)
Triglycerides: 202 mg/dL — ABNORMAL HIGH (ref 0–149)
VLDL CHOLESTEROL CAL: 40 mg/dL (ref 5–40)

## 2018-03-22 LAB — CBC WITH DIFFERENTIAL/PLATELET
BASOS ABS: 0 10*3/uL (ref 0.0–0.2)
Basos: 0 %
EOS (ABSOLUTE): 0.1 10*3/uL (ref 0.0–0.4)
Eos: 2 %
HEMOGLOBIN: 14.7 g/dL (ref 11.1–15.9)
Hematocrit: 44.4 % (ref 34.0–46.6)
IMMATURE GRANULOCYTES: 0 %
Immature Grans (Abs): 0 10*3/uL (ref 0.0–0.1)
LYMPHS ABS: 2.4 10*3/uL (ref 0.7–3.1)
Lymphs: 34 %
MCH: 32.1 pg (ref 26.6–33.0)
MCHC: 33.1 g/dL (ref 31.5–35.7)
MCV: 97 fL (ref 79–97)
MONOCYTES: 6 %
Monocytes Absolute: 0.4 10*3/uL (ref 0.1–0.9)
NEUTROS PCT: 58 %
Neutrophils Absolute: 4.1 10*3/uL (ref 1.4–7.0)
Platelets: 304 10*3/uL (ref 150–450)
RBC: 4.58 x10E6/uL (ref 3.77–5.28)
RDW: 14.4 % (ref 12.3–15.4)
WBC: 7.1 10*3/uL (ref 3.4–10.8)

## 2018-03-22 LAB — HEMOGLOBIN A1C
Est. average glucose Bld gHb Est-mCnc: 123 mg/dL
Hgb A1c MFr Bld: 5.9 % — ABNORMAL HIGH (ref 4.8–5.6)

## 2018-03-22 LAB — TSH: TSH: 4.38 u[IU]/mL (ref 0.450–4.500)

## 2018-03-22 MED ORDER — SYNTHROID 137 MCG PO TABS: 137.0000 ug | ORAL_TABLET | Freq: Every day | ORAL | 5 refills | Status: DC

## 2018-03-26 ENCOUNTER — Other Ambulatory Visit: Payer: Self-pay | Admitting: Family Medicine

## 2018-03-26 DIAGNOSIS — I10 Essential (primary) hypertension: Secondary | ICD-10-CM

## 2018-03-26 NOTE — Telephone Encounter (Signed)
This and a refill for benazepril came in. Patient saw Jill Mathews but the note states that she is still a patient of yours just seeing Vickie for refills? I thought she was transferring care to another provider in office due to the fact that she can only come in on Fridays? Should I refill these and if so, for how long?

## 2018-03-26 NOTE — Telephone Encounter (Signed)
Send to [facility] and see if she will fill (since she saw and did labs), since she really doesn't intent to f/u with me, since she can only come on Fridays

## 2018-03-26 NOTE — Telephone Encounter (Signed)
See message on prior refill. Thanks.

## 2018-03-26 NOTE — Telephone Encounter (Signed)
Sending to Vickie per Dr. Lynelle Doctor.

## 2018-03-31 ENCOUNTER — Encounter: Payer: Self-pay | Admitting: Rheumatology

## 2018-03-31 ENCOUNTER — Ambulatory Visit: Payer: BC Managed Care – PPO | Admitting: Rheumatology

## 2018-03-31 VITALS — BP 123/77 | HR 76 | Resp 16 | Ht 67.5 in | Wt 249.4 lb

## 2018-03-31 DIAGNOSIS — Z8679 Personal history of other diseases of the circulatory system: Secondary | ICD-10-CM

## 2018-03-31 DIAGNOSIS — R7303 Prediabetes: Secondary | ICD-10-CM

## 2018-03-31 DIAGNOSIS — M0579 Rheumatoid arthritis with rheumatoid factor of multiple sites without organ or systems involvement: Secondary | ICD-10-CM | POA: Diagnosis not present

## 2018-03-31 DIAGNOSIS — Z8639 Personal history of other endocrine, nutritional and metabolic disease: Secondary | ICD-10-CM

## 2018-03-31 DIAGNOSIS — Z79899 Other long term (current) drug therapy: Secondary | ICD-10-CM | POA: Diagnosis not present

## 2018-03-31 DIAGNOSIS — Z1589 Genetic susceptibility to other disease: Secondary | ICD-10-CM

## 2018-03-31 NOTE — Patient Instructions (Signed)
.  Standing Labs We placed an order today for your standing lab work.    Please come back and get your standing labs in January and every 3 months. TB gold with next lab.  We have open lab Monday through Friday from 8:30-11:30 AM and 1:30-4:00 PM  at the office of Dr. Pollyann Savoy.   You may experience shorter wait times on Monday and Friday afternoons. The office is located at [address], [place], [place] [address] No appointment is necessary.   Labs are drawn by [facility].  You may receive a bill from [facility] for your lab work. If you have any questions regarding directions or hours of operation,  please call [phone].   Just as a reminder please drink plenty of water prior to coming for your lab work. Thanks!

## 2018-04-02 ENCOUNTER — Telehealth: Payer: Self-pay | Admitting: Rheumatology

## 2018-04-02 ENCOUNTER — Other Ambulatory Visit: Payer: Self-pay | Admitting: Rheumatology

## 2018-04-02 NOTE — Telephone Encounter (Signed)
Last visit: 03/31/2018 Next visit: message sent to the front desk to schedule follow up. Labs: 03/20/2018 Creat 1.10, GFR 57 TB gold: 05/05/2017 negative   Okay to refill enbrel?

## 2018-04-02 NOTE — Telephone Encounter (Signed)
Ok to refill.  Patient will need to return in November 2019 for TB gold.

## 2018-04-02 NOTE — Telephone Encounter (Signed)
LMOM for patient to call and schedule her follow-up appointment. °

## 2018-04-02 NOTE — Telephone Encounter (Signed)
-----   Message from Ellen Henri, CMA sent at 04/02/2018  8:56 AM EDT ----- Regarding: follow up Please call patient and schedule follow up. Patient is due in about 5 months (around 08/30/2018). Thanks!

## 2018-04-27 ENCOUNTER — Other Ambulatory Visit: Payer: Self-pay | Admitting: Family Medicine

## 2018-04-27 DIAGNOSIS — I10 Essential (primary) hypertension: Secondary | ICD-10-CM

## 2018-06-23 ENCOUNTER — Other Ambulatory Visit: Payer: Self-pay | Admitting: Physician Assistant

## 2018-06-23 NOTE — Telephone Encounter (Signed)
Please advise patient to update TB gold ASAP.

## 2018-06-23 NOTE — Telephone Encounter (Signed)
Left message to advise patient she is due to update TB gold and schedule a follow up appointment.

## 2018-06-23 NOTE — Telephone Encounter (Signed)
Last visit: 03/31/2018 Next visit: message sent to the front desk to schedule follow up. Labs: 03/20/2018 Creat 1.10, GFR 57 TB gold: 05/05/2017 negative

## 2018-06-26 ENCOUNTER — Other Ambulatory Visit: Payer: Self-pay | Admitting: Family Medicine

## 2018-06-26 DIAGNOSIS — I10 Essential (primary) hypertension: Secondary | ICD-10-CM

## 2018-07-03 ENCOUNTER — Telehealth: Payer: Self-pay | Admitting: *Deleted

## 2018-07-03 NOTE — Telephone Encounter (Signed)
Received records from [facility]. Patient is having recurrent disease. Patient on Enbrel, MTX 0.6 mL weekly for treatment of RA.  Will change to Humira. Attempted to contact the patient and left message for patient to call the office.

## 2018-07-07 ENCOUNTER — Other Ambulatory Visit: Payer: Self-pay | Admitting: *Deleted

## 2018-07-07 DIAGNOSIS — Z79899 Other long term (current) drug therapy: Secondary | ICD-10-CM

## 2018-07-08 ENCOUNTER — Telehealth: Payer: Self-pay | Admitting: *Deleted

## 2018-07-08 NOTE — Progress Notes (Signed)
Please notify patient that her glucose is elevated.  Her creatinine is still elevated.  I am uncertain if it is due to methotrexate or ACE inhibitor or due to thiazides.  The plan was to switch her to Humira due to recurrent diaper issues.  Please contact patient to discuss above.

## 2018-07-08 NOTE — Telephone Encounter (Signed)
Error

## 2018-07-09 ENCOUNTER — Telehealth: Payer: Self-pay | Admitting: Rheumatology

## 2018-07-09 LAB — COMPLETE METABOLIC PANEL WITH GFR
AG Ratio: 2 (calc) (ref 1.0–2.5)
ALBUMIN MSPROF: 4 g/dL (ref 3.6–5.1)
ALT: 27 U/L (ref 6–29)
AST: 14 U/L (ref 10–35)
Alkaline phosphatase (APISO): 72 U/L (ref 33–130)
BUN / CREAT RATIO: 26 (calc) — AB (ref 6–22)
BUN: 28 mg/dL — AB (ref 7–25)
CO2: 29 mmol/L (ref 20–32)
CREATININE: 1.08 mg/dL — AB (ref 0.50–1.05)
Calcium: 9.4 mg/dL (ref 8.6–10.4)
Chloride: 101 mmol/L (ref 98–110)
GFR, EST AFRICAN AMERICAN: 67 mL/min/{1.73_m2} (ref >=60)
GFR, Est Non African American: 58 mL/min/{1.73_m2} — ABNORMAL LOW (ref >=60)
GLUCOSE: 261 mg/dL — AB (ref 65–99)
Globulin: 2 g/dL (calc) (ref 1.9–3.7)
Potassium: 4.2 mmol/L (ref 3.5–5.3)
Sodium: 139 mmol/L (ref 135–146)
TOTAL PROTEIN: 6 g/dL — AB (ref 6.1–8.1)
Total Bilirubin: 0.4 mg/dL (ref 0.2–1.2)

## 2018-07-09 LAB — CBC WITH DIFFERENTIAL/PLATELET
Absolute Monocytes: 333 cells/uL (ref 200–950)
BASOS ABS: 33 {cells}/uL (ref 0–200)
Basophils Relative: 0.3 %
EOS ABS: 11 {cells}/uL — AB (ref 15–500)
EOS PCT: 0.1 %
HEMATOCRIT: 44 % (ref 35.0–45.0)
HEMOGLOBIN: 15 g/dL (ref 11.7–15.5)
LYMPHS ABS: 1543 {cells}/uL (ref 850–3900)
MCH: 31.6 pg (ref 27.0–33.0)
MCHC: 34.1 g/dL (ref 32.0–36.0)
MCV: 92.8 fL (ref 80.0–100.0)
MONOS PCT: 3 %
MPV: 10.7 fL (ref 7.5–12.5)
NEUTROS ABS: 9180 {cells}/uL — AB (ref 1500–7800)
NEUTROS PCT: 82.7 %
Platelets: 291 10*3/uL (ref 140–400)
RBC: 4.74 10*6/uL (ref 3.80–5.10)
RDW: 13 % (ref 11.0–15.0)
Total Lymphocyte: 13.9 %
WBC: 11.1 10*3/uL — ABNORMAL HIGH (ref 3.8–10.8)

## 2018-07-09 LAB — QUANTIFERON-TB GOLD PLUS
MITOGEN-NIL: 3.04 [IU]/mL
NIL: 0.06 [IU]/mL
QuantiFERON-TB Gold Plus: NEGATIVE
TB1-NIL: 0.04 IU/mL
TB2-NIL: 0.03 IU/mL

## 2018-07-09 NOTE — Telephone Encounter (Signed)
Left message to advise patient per her request,  that her glucose is elevated.  Her creatinine is still elevated.  I am uncertain if it is due to methotrexate or ACE inhibitor or due to thiazides.  The plan was to switch her to Humira due to recurrent eye issues.  TB gold negative. WBC count is borderline elevated. Advised patient to contact the office to schedule a sooner appointment and to advise if she has had any recent infections.

## 2018-07-09 NOTE — Telephone Encounter (Signed)
Patient left a voicemail stating she was returning your call.  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  Patient requested you leave a message.

## 2018-07-13 ENCOUNTER — Telehealth: Payer: Self-pay | Admitting: Rheumatology

## 2018-07-13 ENCOUNTER — Telehealth: Payer: Self-pay | Admitting: Pharmacist

## 2018-07-13 NOTE — Telephone Encounter (Signed)
Patient has had recurrent eye issues. Considering swtiching to Humira.  Authorization has been submitted to patient's insurance via Cover My Meds. Will update once we receive a response.  KeyCoralee Rud: [id number] - PA Case ID: [id number]

## 2018-07-13 NOTE — Telephone Encounter (Signed)
Patient called stating she was returning Andrea's call to schedule an earlier appointment.  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  Patient states "she thinks her Creatinine level is high due to her Keto Diet which she has just stopped."  Patient states she also has a sinus infection (self-diagnosed).

## 2018-07-13 NOTE — Telephone Encounter (Signed)
Left message to advise patient to see PCP or urgent care for evaluation of sinus infection. Advise patient we would see her on 07/28/18 to discuss changing her to Humira.

## 2018-07-13 NOTE — Telephone Encounter (Signed)
Received a fax from [facility] regarding a prior authorization for HUMIRA. Authorization has been APPROVED from 07/13/2018 to 07/13/2019.   Will send document to scan center.  Authorization # [facility] [employer] [facility] [id number]

## 2018-07-15 NOTE — Progress Notes (Signed)
Office Visit Note  [name]             Date of Birth: [birth year]           MRN: [id number]             PCP: Rita Ohara, MD Referring: Rita Ohara, MD Visit Date: 07/28/2018 Occupation: _0 @  Subjective:  Rheumatoid Arthritis ([facility])     History of Present Illness: Jill Mathews is a 53 y.o. female history of seropositive rheumatoid arthritis and keratitis.  She states she had an episode of keratitis about a year ago and then she did really well.  She had another episode in January 2020 for which she went to see Dr. Kathlen Mody.  She was diagnosed with peripheral ulcerative keratitis.  She was treated with topical steroid drops and also placed on prednisone.  She has been on prednisone 20 mg p.o. daily since then.  Sprints increased joint pain and discomfort as well.  She states she has been having increased joint pain for the last weeks.  She describes pain and discomfort in her both shoulders, both elbows, bilateral wrist and hands.  She has been noticing swelling in her hands.  She has missed 4 days of work in the last one month due to severe fatigue and episode of keratitis.  Continues to have severe fatigue.  Activities of Daily Living:  Patient reports morning stiffness for 24 hours.   Patient Reports nocturnal pain.  Difficulty dressing/grooming: Reports Difficulty climbing stairs: Reports Difficulty getting out of chair: Reports Difficulty using hands for taps, buttons, cutlery, and/or writing: Reports  Review of Systems  Constitutional: Positive for fatigue. Negative for night sweats, weight gain and weight loss.  HENT: Positive for mouth dryness. Negative for mouth sores, trouble swallowing, trouble swallowing and nose dryness.   Eyes: Positive for dryness. Negative for pain, redness and visual disturbance.  Respiratory: Negative for cough, shortness of breath and difficulty breathing.   Cardiovascular: Negative for chest pain, palpitations,  hypertension, irregular heartbeat and swelling in legs/feet.  Gastrointestinal: Positive for constipation. Negative for blood in stool and diarrhea.  Endocrine: Negative for excessive thirst and increased urination.  Genitourinary: Negative for difficulty urinating and vaginal dryness.  Musculoskeletal: Positive for arthralgias, gait problem, joint pain, muscle weakness, morning stiffness and muscle tenderness. Negative for joint swelling, myalgias and myalgias.  Skin: Negative for color change, rash, hair loss, skin tightness, ulcers and sensitivity to sunlight.  Allergic/Immunologic: Negative for susceptible to infections.  Neurological: Positive for weakness. Negative for dizziness, memory loss and night sweats.  Hematological: Negative for bruising/bleeding tendency and swollen glands.  Psychiatric/Behavioral: Positive for sleep disturbance. Negative for depressed mood. The patient is not nervous/anxious.     PMFS History:  Patient Active Problem List   Diagnosis Date Noted  . HLA B27 (HLA B27 positive) 10/24/2016  . Prediabetes 10/31/2014  . Mixed hyperlipidemia 09/09/2012  . Hypothyroidism 05/25/2012  . Essential hypertension, benign 05/25/2012  . Rheumatoid arthritis involving multiple sites with positive rheumatoid factor ([place]) 05/25/2012  . Obesity (BMI 30-39.9) 05/25/2012    Past Medical History:  Diagnosis Date  . Hyperlipidemia    borderline; dietary management  . Hypertension   . Hypothyroidism 2001  . Rheumatoid arthritis(714.0) 04/2006   Dr. Estanislado Pandy manages    Family History  Problem Relation Age of Onset  . Heart disease Mother 78       MI, stents x 2; CABG age 53  . Stroke Mother 11  . Hyperlipidemia Mother   .  Hypertension Mother   . Depression Mother   . Diabetes Mother   . Thyroid disease Mother        Graves disease, s/p RAI, now hypothyroid  . Irritable bowel syndrome Mother   . COPD Mother   . Colon polyps Mother   . Cancer Mother        skin  (not melanoma)  . Kidney Stones Mother   . Stroke Father 23  . Hyperlipidemia Father   . Hypertension Father   . Depression Father   . Diabetes Father   . Thyroid disease Father        hypothyroidism  . COPD Father   . Heart disease Father 42       MI; s/p CABG; pacemaker  . Cancer Sister 85       pancreatic cancer  . Bipolar disorder Sister   . Lupus Paternal Aunt   . Heart disease Maternal Uncle        s/p CABG, late 54's  . Cancer Maternal Grandfather        laryngeal, smoker   Past Surgical History:  Procedure Laterality Date  . 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 PILONIDAL CYST EXCISION  1983  . TONSILLECTOMY  age 52  . WISDOM TOOTH EXTRACTION     Social History   Social History Narrative   Lives with female partner of 30 years--got married 06/11/13. 3 cats, 2 dogs, 5 chickens.      (had to slaughter chicken in order to isolate dog after incident with rabid fox getting into yard; one dog wasn't vaccinated--had to put down)   Immunization History  Administered Date(s) Administered  . Influenza Split 03/10/2012  . Pneumococcal Conjugate-13 11/01/2015  . Pneumococcal Polysaccharide-23 07/02/2006  . Tdap 09/09/2012     Objective: Vital Signs: BP 119/81 (BP Location: Left Arm, Patient Position: Sitting, Cuff Size: Normal)   Pulse 72   Resp 16   Ht 5' 7.5" (1.715 m)   Wt 253 lb 0.6 oz (114.8 kg)   LMP 01/13/2013   BMI 39.05 kg/m    Physical Exam Vitals signs and nursing note reviewed.  Constitutional:      Appearance: She is well-developed.  HENT:     Head: Normocephalic and atraumatic.  Eyes:     Conjunctiva/sclera: Conjunctivae normal.  Neck:     Musculoskeletal: Normal range of motion.  Cardiovascular:     Rate and Rhythm: Normal rate and regular rhythm.     Heart sounds: Normal heart sounds.  Pulmonary:     Effort: Pulmonary effort is normal.     Breath sounds: Normal breath sounds.  Abdominal:     General: Bowel sounds are  normal.     Palpations: Abdomen is soft.  Lymphadenopathy:     Cervical: No cervical adenopathy.  Skin:    General: Skin is warm and dry.     Capillary Refill: Capillary refill takes less than 2 seconds.  Neurological:     Mental Status: She is alert and oriented to person, place, and time.  Psychiatric:        Behavior: Behavior normal.      Musculoskeletal Exam: C-spine thoracic lumbar spine good range of motion.  She has discomfort range of motion of her cervical spine.  She has painful range of motion of bilateral shoulders.  She has severe pain and discomfort in her right elbow joint with limited extension.  She had tenderness on palpation of her left elbow.  She has tenderness of bilateral wrist joints with synovitis of her left wrist joint.  She has tenderness over bilateral MCP joints.  She has painful range of motion of her left hip joint.  CDAI Exam: CDAI Score: 15.6  Patient Global Assessment: 8 (mm); Provider Global Assessment: 8 (mm) Swollen: 1 ; Tender: 14  Joint Exam      Right  Left  Glenohumeral   Tender   Tender  Elbow   Tender     Wrist   Tender  Swollen Tender  MCP 2   Tender   Tender  MCP 3   Tender   Tender  MCP 4   Tender   Tender  MCP 5   Tender   Tender  Hip      Tender     Investigation: No additional findings.  Imaging: No results found.  Recent Labs: Lab Results  Component Value Date   WBC 11.1 (H) 07/07/2018   HGB 15.0 07/07/2018   PLT 291 07/07/2018   NA 139 07/07/2018   K 4.2 07/07/2018   CL 101 07/07/2018   CO2 29 07/07/2018   GLUCOSE 261 (H) 07/07/2018   BUN 28 (H) 07/07/2018   CREATININE 1.08 (H) 07/07/2018   BILITOT 0.4 07/07/2018   ALKPHOS 72 03/20/2018   AST 14 07/07/2018   ALT 27 07/07/2018   PROT 6.0 (L) 07/07/2018   ALBUMIN 4.6 03/20/2018   CALCIUM 9.4 07/07/2018   GFRAA 67 07/07/2018   QFTBGOLD NEGATIVE 05/05/2017   QFTBGOLDPLUS NEGATIVE 07/07/2018   Reviewing Dr. Dionne Bucy ophthalmology report: Peripheral  ulcerative keratitis in bilateral eyes with improvement in her right eye. Speciality Comments: No specialty comments available.  Procedures:  No procedures performed Allergies: Penicillins   Assessment / Plan:     Visit Diagnoses: Rheumatoid arthritis involving multiple sites with positive rheumatoid factor (HCC) -  +RF,+ anti-CCP.  She has been having a severe flare with pain and discomfort in multiple joints.  She also has extreme fatigue.  She has missed work recently.  She states despite taking prednisone and multiple medication she is having frequent flares.  She is unable to taper prednisone.  We discussed different treatment options and their side effects.  She has not taken Enbrel in the last 10 days.  The plan is to start her on Humira.  Humira has been approved.  Indications side effects contraindications were discussed.  Handout was given and consent was taken.  She was given first injection of Humira today.  She should be able to take Humira every other week.  I would like to see response from Humira and methotrexate combination.  Due to extreme exhaustion and severe discomfort in multiple joints she is having difficulty keeping up with her work.  She states she has missed several days at work in the last 1 month.  I have given her a total disability for the next 2 weeks.  High risk medication use -Enbrel is discontinued due to recurrent keratitis. MTX 0.6 ml sq q wk, folic acid 1 mg po qd. 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  She states right eye and symptoms improved on prednisone but she continues to have symptoms in her left eye.  She has been followed by Dr. Kathlen Mody.  HLA B27 (HLA B27 positive)  History of hypothyroidism  History of obesity-weight loss diet and exercise was discussed.  History of  hyperlipidemia  History of hypertension-blood pressure is under control.  Prediabetes   Orders: Orders Placed This Encounter  Procedures  . Hepatitis panel, acute  . HIV Antibody (routine testing w rflx)  . Serum protein electrophoresis with reflex  . IgG, IgA, IgM   Meds ordered this encounter  Medications  . Adalimumab (HUMIRA PEN) 40 MG/0.4ML PNKT    Sig: Inject 40 mg into the skin every 14 (fourteen) days.    Dispense:  3 each    Refill:  0    3 kits-6 pens  . Adalimumab (HUMIRA PEN) 40 MG/0.4ML PNKT    Sig: Inject 40 mg into the skin every 14 (fourteen) days.    Dispense:  1 each    Refill:  0  . predniSONE (DELTASONE) 5 MG tablet    Sig: Take 4 tablets (20 mg total) by mouth daily with breakfast for 7 days, THEN 3 tablets (15 mg total) daily with breakfast for 7 days, THEN 2 tablets (10 mg total) daily with breakfast for 7 days, THEN 1 tablet (5 mg total) daily with breakfast for 7 days.    Dispense:  70 tablet    Refill:  0    Face-to-face time spent with patient was 30 minutes. Greater than 50% of time was spent in counseling and coordination of care.  Follow-Up Instructions: Return in about 3 months (around 10/26/2018) for Rheumatoid arthritis keratitis.   Bo Merino, MD  Note - This record has been created using Editor, commissioning.  Chart creation errors have been sought, but may not always  have been located. Such creation errors do not reflect on  the standard of medical care.

## 2018-07-28 ENCOUNTER — Ambulatory Visit: Payer: BC Managed Care – PPO | Admitting: Rheumatology

## 2018-07-28 ENCOUNTER — Encounter: Payer: Self-pay | Admitting: Rheumatology

## 2018-07-28 ENCOUNTER — Telehealth: Payer: Self-pay | Admitting: Rheumatology

## 2018-07-28 VITALS — BP 119/81 | HR 72 | Resp 16 | Ht 67.5 in | Wt 253.0 lb

## 2018-07-28 DIAGNOSIS — Z1589 Genetic susceptibility to other disease: Secondary | ICD-10-CM | POA: Diagnosis not present

## 2018-07-28 DIAGNOSIS — Z79899 Other long term (current) drug therapy: Secondary | ICD-10-CM | POA: Diagnosis not present

## 2018-07-28 DIAGNOSIS — Z8639 Personal history of other endocrine, nutritional and metabolic disease: Secondary | ICD-10-CM

## 2018-07-28 DIAGNOSIS — R7303 Prediabetes: Secondary | ICD-10-CM

## 2018-07-28 DIAGNOSIS — H169 Unspecified keratitis: Secondary | ICD-10-CM | POA: Diagnosis not present

## 2018-07-28 DIAGNOSIS — Z8679 Personal history of other diseases of the circulatory system: Secondary | ICD-10-CM

## 2018-07-28 DIAGNOSIS — M0579 Rheumatoid arthritis with rheumatoid factor of multiple sites without organ or systems involvement: Secondary | ICD-10-CM | POA: Diagnosis not present

## 2018-07-28 MED ORDER — ADALIMUMAB 40 MG/0.4ML ~~LOC~~ AJKT: 40.0000 mg | AUTO-INJECTOR | SUBCUTANEOUS | 0 refills | Status: DC

## 2018-07-28 MED ORDER — PREDNISONE 5 MG PO TABS: ORAL_TABLET | ORAL | 0 refills | Status: AC

## 2018-07-28 NOTE — Telephone Encounter (Signed)
Patient paid $25.00 cash to [employer], on 07/27/18 for FMLA. (AB)

## 2018-07-28 NOTE — Patient Instructions (Signed)
Standing Labs We placed an order today for your standing lab work.    Please come back and get your standing labs in 1 month and then every 3 months.  We have open lab Monday through Friday from 8:30-11:30 AM and 1:30-4:00 PM  at the office of Dr. Dhriti Fales.   You may experience shorter wait times on Monday and Friday afternoons. The office is located at [address], [address], [place], [place] [address] No appointment is necessary.   Labs are drawn by [facility].  You may receive a bill from [facility] for your lab work.  If you wish to have your labs drawn at another location, please call the office 24 hours in advance to send orders.  If you have any questions regarding directions or hours of operation,  please call [phone].   Just as a reminder please drink plenty of water prior to coming for your lab work. Thanks!  

## 2018-07-28 NOTE — Progress Notes (Signed)
Pharmacy Note Subjective: Patient presents today to the [facility] to see Dr. Corliss Skains.   Patient seen by the pharmacist for counseling on Humira for rheumatoid arthritis. Current medication regimen is Enbrel and MTX.  Her las Enbrel dose was 2/8.  Objective:  CBC    Component Value Date/Time   WBC 11.1 (H) 07/07/2018 1432   RBC 4.74 07/07/2018 1432   HGB 15.0 07/07/2018 1432   HGB 14.7 03/20/2018 1655   HCT 44.0 07/07/2018 1432   HCT 44.4 03/20/2018 1655   PLT 291 07/07/2018 1432   PLT 304 03/20/2018 1655   MCV 92.8 07/07/2018 1432   MCV 97 03/20/2018 1655   MCH 31.6 07/07/2018 1432   MCHC 34.1 07/07/2018 1432   RDW 13.0 07/07/2018 1432   RDW 14.4 03/20/2018 1655   LYMPHSABS 1,543 07/07/2018 1432   LYMPHSABS 2.4 03/20/2018 1655   MONOABS 664 12/26/2016 1539   EOSABS 11 (L) 07/07/2018 1432   EOSABS 0.1 03/20/2018 1655   BASOSABS 33 07/07/2018 1432   BASOSABS 0.0 03/20/2018 1655     CMP     Component Value Date/Time   NA 139 07/07/2018 1432   NA 143 03/20/2018 1655   K 4.2 07/07/2018 1432   CL 101 07/07/2018 1432   CO2 29 07/07/2018 1432   GLUCOSE 261 (H) 07/07/2018 1432   BUN 28 (H) 07/07/2018 1432   BUN 21 03/20/2018 1655   CREATININE 1.08 (H) 07/07/2018 1432   CALCIUM 9.4 07/07/2018 1432   PROT 6.0 (L) 07/07/2018 1432   PROT 7.1 03/20/2018 1655   ALBUMIN 4.6 03/20/2018 1655   AST 14 07/07/2018 1432   ALT 27 07/07/2018 1432   ALKPHOS 72 03/20/2018 1655   BILITOT 0.4 07/07/2018 1432   BILITOT 0.3 03/20/2018 1655   GFRNONAA 58 (L) 07/07/2018 1432   GFRAA 67 07/07/2018 1432    Baseline Immunosuppressant Therapy Labs Quantiferon TB Gold Latest Ref Rng & Units 07/07/2018  Quantiferon TB Gold Plus NEGATIVE NEGATIVE  HIV: pending 07/28/18 Hepatitis panel:pending 07/28/18 Immunoglobulins:pending 07/28/18 SPEP: pending 07/28/18  Chest x-ray: Evidence of previous granulomatous infection, stable. There is no active cardiopulmonary disease.  09/27/12  Does patient have diagnosis of heart failure?  No  Assessment/Plan:  Counseled patient that Humira is a TNF blocking agent.  Counseled patient on purpose, proper use, and adverse effects of Humira.  Reviewed the most common adverse effects including infections, headache, and injection site reactions. Discussed that there is the possibility of an increased risk of malignancy but it is not well understood if this increased risk is due to the medication or the disease state.  Advised patient to get yearly dermatology exams due to risk of skin cancer. Counseled patient that Humira should be held prior to scheduled surgery.  Counseled patient to avoid live vaccines while on Humira.  Advised patient to get annual influenza vaccine and the pneumococcal vaccine as indicated.    Reviewed the importance of regular labs while on Humira therapy.  Standing orders placed.  Provided patient with medication education material and answered all questions.  Patient consented to Humira.  Will upload consent into the media tab.  Reviewed storage instructions of Humira.  Advised initial injection must be administered in office.  Patient verbalized understanding.  Dose will be for rheumatoid arthritis Humira 40 mg every 14 days.  Prescription sent to [facility].  Prednisone taper sent to [facility].  Demonstrated proper injection technique with Humira demo pen.  Patient able to demonstrate proper injection  technique using the teach back method.  Patient self injected in the left anterior thigh with:  Sample Medication: Humira NDC: 8372-9021-11 Lot: [id number] Expiration: 10/09/2019  Patient tolerated well.  Observed for 30 mins in office for adverse reaction and none noted.   All questions encouraged and answered.  Instructed patient to call with any further questions or concerns.  Verlin Fester, PharmD, [facility] Clinical Pharmacist  07/28/2018 3:35 PM

## 2018-07-30 LAB — HEPATITIS PANEL, ACUTE
Hep A IgM: NONREACTIVE
Hep B C IgM: NONREACTIVE
Hepatitis B Surface Ag: NONREACTIVE
Hepatitis C Ab: NONREACTIVE
SIGNAL TO CUT-OFF: 0.01 (ref <1.00)

## 2018-07-30 LAB — PROTEIN ELECTROPHORESIS, SERUM, WITH REFLEX
Albumin ELP: 4.2 g/dL (ref 3.8–4.8)
Alpha 1: 0.3 g/dL (ref 0.2–0.3)
Alpha 2: 0.7 g/dL (ref 0.5–0.9)
BETA GLOBULIN: 0.4 g/dL (ref 0.4–0.6)
Beta 2: 0.2 g/dL (ref 0.2–0.5)
Gamma Globulin: 0.7 g/dL — ABNORMAL LOW (ref 0.8–1.7)
TOTAL PROTEIN: 6.4 g/dL (ref 6.1–8.1)

## 2018-07-30 LAB — IGG, IGA, IGM
IgG (Immunoglobin G), Serum: 713 mg/dL (ref 600–1640)
IgM, Serum: 120 mg/dL (ref 50–300)
Immunoglobulin A: 57 mg/dL (ref 47–310)

## 2018-07-30 LAB — HIV ANTIBODY (ROUTINE TESTING W REFLEX): HIV: NONREACTIVE

## 2018-08-11 ENCOUNTER — Telehealth: Payer: Self-pay | Admitting: Rheumatology

## 2018-08-11 ENCOUNTER — Encounter: Payer: Self-pay | Admitting: *Deleted

## 2018-08-11 NOTE — Telephone Encounter (Signed)
Per Dr. Corliss Skains patient may return to work. Letter ready and placed up front. Patient advised.

## 2018-08-11 NOTE — Telephone Encounter (Signed)
Patient called requesting a return to work note.  Patient states her flair up is gone and in order for her to return to work today she needs a note from Dr. Corliss Skains.  Patient is requesting a return call when the note is ready so she can come by the office and pick it up.

## 2018-08-18 ENCOUNTER — Ambulatory Visit: Payer: BC Managed Care – PPO | Admitting: Rheumatology

## 2018-09-22 ENCOUNTER — Other Ambulatory Visit: Payer: Self-pay | Admitting: Family Medicine

## 2018-09-22 DIAGNOSIS — I10 Essential (primary) hypertension: Secondary | ICD-10-CM

## 2018-09-22 NOTE — Telephone Encounter (Signed)
Called pt to advise she needs a CPE with Dr. Lynelle Doctor . K[facility]

## 2018-10-05 ENCOUNTER — Other Ambulatory Visit: Payer: Self-pay | Admitting: Rheumatology

## 2018-10-05 DIAGNOSIS — M0579 Rheumatoid arthritis with rheumatoid factor of multiple sites without organ or systems involvement: Secondary | ICD-10-CM

## 2018-10-05 NOTE — Telephone Encounter (Signed)
Last Visit: 07/28/2018 Next Visit: 10/26/2018 Labs: 07/07/2018 elevated glucose, creat 1.08 TB Gold: 07/07/2018 negative   Okay to refill per Dr. Corliss Skains.

## 2018-10-19 NOTE — Progress Notes (Signed)
Office Visit Note  [name]             Date of Birth: Feb 27, 1966           MRN: [id number]             PCP: Rita Ohara, MD Referring: Rita Ohara, MD Visit Date: 10/26/2018 Occupation: _0 @  Subjective:  Medication monitoring     History of Present Illness: Jill Mathews is a 53 y.o. female history of seropositive rheumatoid arthritis and keratitis. She is currently on Humira 40 mg every 14 days started in February 2019, methotrexate 0.6 mL every week, and folic acid 2 mg daily.  She denies any recent flares.  She experiences increased arthralgias with weather changes.  She has intermittent pain in both shoulder joints.  She denies any joint swelling.  She has been taking tylenol at bedtime to help with nocturnal pain.    Activities of Daily Living:  Patient reports morning stiffness for 10 minutes.   Patient Reports nocturnal pain.  Difficulty dressing/grooming: Denies Difficulty climbing stairs: Reports Difficulty getting out of chair: Denies Difficulty using hands for taps, buttons, cutlery, and/or writing: Denies  Review of Systems  Constitutional: Positive for fatigue.  HENT: Negative for mouth sores, mouth dryness and nose dryness.   Eyes: Negative for pain, visual disturbance and dryness.  Respiratory: Negative for cough, hemoptysis, shortness of breath and difficulty breathing.   Cardiovascular: Negative for chest pain, palpitations, hypertension and swelling in legs/feet.  Gastrointestinal: Negative for blood in stool, constipation and diarrhea.  Endocrine: Negative for increased urination.  Genitourinary: Negative for painful urination.  Musculoskeletal: Positive for arthralgias, joint pain and morning stiffness. Negative for joint swelling, myalgias, muscle weakness, muscle tenderness and myalgias.  Skin: Negative for color change, pallor, rash, hair loss, nodules/bumps, skin tightness, ulcers and sensitivity to sunlight.   Allergic/Immunologic: Negative for susceptible to infections.  Neurological: Negative for dizziness, numbness, headaches and weakness.  Hematological: Negative for swollen glands.  Psychiatric/Behavioral: Positive for sleep disturbance. Negative for depressed mood. The patient is not nervous/anxious.     PMFS History:  Patient Active Problem List   Diagnosis Date Noted  . HLA B27 (HLA B27 positive) 10/24/2016  . Prediabetes 10/31/2014  . Mixed hyperlipidemia 09/09/2012  . Hypothyroidism 05/25/2012  . Essential hypertension, benign 05/25/2012  . Rheumatoid arthritis involving multiple sites with positive rheumatoid factor ([place]) 05/25/2012  . Obesity (BMI 30-39.9) 05/25/2012    Past Medical History:  Diagnosis Date  . Hyperlipidemia    borderline; dietary management  . Hypertension   . Hypothyroidism 2001  . Rheumatoid arthritis(714.0) 04/2006   Dr. Estanislado Pandy manages    Family History  Problem Relation Age of Onset  . Heart disease Mother 33       MI, stents x 2; CABG age 58  . Stroke Mother 40  . Hyperlipidemia Mother   . Hypertension Mother   . Depression Mother   . Diabetes Mother   . Thyroid disease Mother        Graves disease, s/p RAI, now hypothyroid  . Irritable bowel syndrome Mother   . COPD Mother   . Colon polyps Mother   . Cancer Mother        skin (not melanoma)  . Kidney Stones Mother   . Stroke Father 31  . Hyperlipidemia Father   . Hypertension Father   . Depression Father   . Diabetes Father   . Thyroid disease Father  hypothyroidism  . COPD Father   . Heart disease Father 22       MI; s/p CABG; pacemaker  . Cancer Sister 48       pancreatic cancer  . Bipolar disorder Sister   . Lupus Paternal Aunt   . Heart disease Maternal Uncle        s/p CABG, late 66's  . Cancer Maternal Grandfather        laryngeal, smoker   Past Surgical History:  Procedure Laterality Date  . 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 PILONIDAL  CYST EXCISION  1983  . TONSILLECTOMY  age 25  . WISDOM TOOTH EXTRACTION     Social History   Social History Narrative   Lives with female partner of 90 years--got married 06/11/13. 3 cats, 2 dogs, 5 chickens.      (had to slaughter chicken in order to isolate dog after incident with rabid fox getting into yard; one dog wasn't vaccinated--had to put down)   Immunization History  Administered Date(s) Administered  . Influenza Split 03/10/2012  . Pneumococcal Conjugate-13 11/01/2015  . Pneumococcal Polysaccharide-23 07/02/2006  . Tdap 09/09/2012     Objective: Vital Signs: BP 130/70 (BP Location: Left Arm, Patient Position: Sitting, Cuff Size: Large)   Pulse 74   Resp 14   Ht _0  (1.702 m)   Wt 258 lb 12.8 oz (117.4 kg)   LMP 01/13/2013   BMI 40.53 kg/m    Physical Exam Vitals signs and nursing note reviewed.  Constitutional:      Appearance: She is well-developed.  HENT:     Head: Normocephalic and atraumatic.  Eyes:     Conjunctiva/sclera: Conjunctivae normal.  Neck:     Musculoskeletal: Normal range of motion.  Cardiovascular:     Rate and Rhythm: Normal rate and regular rhythm.     Heart sounds: Normal heart sounds.  Pulmonary:     Effort: Pulmonary effort is normal.     Breath sounds: Normal breath sounds.  Abdominal:     General: Bowel sounds are normal.     Palpations: Abdomen is soft.  Lymphadenopathy:     Cervical: No cervical adenopathy.  Skin:    General: Skin is warm and dry.     Capillary Refill: Capillary refill takes less than 2 seconds.  Neurological:     Mental Status: She is alert and oriented to person, place, and time.  Psychiatric:        Behavior: Behavior normal.      Musculoskeletal Exam: C-spine, thoracic spine, and lumbar spine good ROM.  No midline spinal tenderness.  No SI joint tenderness.  Shoulder joints, elbow joints, wrist joints, MCPs, PIPs, and DIPs good ROM with no synovitis.  DIP synovial thickening.  Hip joints, knee  joints, ankle joints, MTPs, PIPs, and DIPs good ROM with no synovitis.  No warmth or effusion of knee joints.  No tenderness or swelling of ankle joints.  No tenderness over trochanteric bursa bilaterally.   CDAI Exam: CDAI Score: Not documented Patient Global Assessment: Not documented; Provider Global Assessment: Not documented Swollen: Not documented; Tender: Not documented Joint Exam   Not documented   There is currently no information documented on the homunculus. Go to the Rheumatology activity and complete the homunculus joint exam.  Investigation: No additional findings.  Imaging: No results found.  Recent Labs: Lab Results  Component Value Date   WBC 11.1 (H) 07/07/2018   HGB 15.0 07/07/2018  PLT 291 07/07/2018   NA 139 07/07/2018   K 4.2 07/07/2018   CL 101 07/07/2018   CO2 29 07/07/2018   GLUCOSE 261 (H) 07/07/2018   BUN 28 (H) 07/07/2018   CREATININE 1.08 (H) 07/07/2018   BILITOT 0.4 07/07/2018   ALKPHOS 72 03/20/2018   AST 14 07/07/2018   ALT 27 07/07/2018   PROT 6.4 07/28/2018   ALBUMIN 4.6 03/20/2018   CALCIUM 9.4 07/07/2018   GFRAA 67 07/07/2018   QFTBGOLD NEGATIVE 05/05/2017   QFTBGOLDPLUS NEGATIVE 07/07/2018    Speciality Comments: Prior therapy includes: Enbrel (inadequate response)  Procedures:  No procedures performed Allergies: Penicillins   Assessment / Plan:     Visit Diagnoses: Rheumatoid arthritis involving multiple sites with positive rheumatoid factor (HCC) - +RF,+ anti-CCP: She has no synovitis on exam.  She has not had any recent rheumatoid arthritis flares.  She is clinically doing well on Humira 40 mg subcutaneous injections every 14 days, methotrexate 0.6 mL subcu once weekly, and folic acid 2 mg by mouth daily.  She started on Humira on 07/28/2018.  She has not missed any doses recently.  She continues to experience arthralgias and stiffness with cold weather fronts.  She requested a refill of methotrexate today.  She was advised to  notify us if she develops increased joint pain or joint swelling.  She will follow-up in the office in 5 months.  High risk medication use - Humira 40 mg sq injections every 14 days (started on 07/28/18), MTX 0.6 ml sq q wk, folic acid 1 mg po qd.  (Enbrel is discontinued due to recurrent keratitis).  She is due to update CBC and CMP today.  She will return in August and every 3 months for lab work.  TB gold was negative on 07/07/2018.  We discussed the importance of social distancing and following standard precautions recommended by the CDC.  She was advised to hold Humira and methotrexate if she develops signs or symptoms of an infection to resume once infection is completely cleared. 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  She is on Humira 40 mg subcutaneous injections every 14 days.  HLA B27 (HLA B27 positive)  Other medical conditions are listed as follows:   History of hypertension  History of obesity  History of hypothyroidism  History of hyperlipidemia  Prediabetes   Orders: No orders of the defined types were placed in this encounter.  No orders of the defined types were placed in this encounter.   Face-to-face time spent with patient was 15 minutes. Greater than 50% of time was spent in counseling and coordination of care.  Follow-Up Instructions: Return in about 5 months (around 03/28/2019) for Rheumatoid arthritis.   Ofilia Neas, PA-C   I examined and evaluated the patient with Hazel Sams PA.  Patient is clinically doing better with no synovitis on examination today.  She has been taking methotrexate and Humira combination which is been working well for her.  She had no flares of keratitis.  The plan of care was discussed as noted above.  Bo Merino, MD  Note -  This record has been created using Editor, commissioning.  Chart creation errors have been sought, but may not always  have been located. Such creation errors do not reflect on  the standard of medical care.

## 2018-10-26 ENCOUNTER — Other Ambulatory Visit: Payer: Self-pay

## 2018-10-26 ENCOUNTER — Encounter: Payer: Self-pay | Admitting: Rheumatology

## 2018-10-26 ENCOUNTER — Ambulatory Visit: Payer: BC Managed Care – PPO | Admitting: Rheumatology

## 2018-10-26 VITALS — BP 130/70 | HR 74 | Resp 14 | Ht 67.0 in | Wt 258.8 lb

## 2018-10-26 DIAGNOSIS — M0579 Rheumatoid arthritis with rheumatoid factor of multiple sites without organ or systems involvement: Secondary | ICD-10-CM

## 2018-10-26 DIAGNOSIS — H169 Unspecified keratitis: Secondary | ICD-10-CM

## 2018-10-26 DIAGNOSIS — Z79899 Other long term (current) drug therapy: Secondary | ICD-10-CM

## 2018-10-26 DIAGNOSIS — Z8639 Personal history of other endocrine, nutritional and metabolic disease: Secondary | ICD-10-CM

## 2018-10-26 DIAGNOSIS — R7303 Prediabetes: Secondary | ICD-10-CM

## 2018-10-26 DIAGNOSIS — Z8679 Personal history of other diseases of the circulatory system: Secondary | ICD-10-CM

## 2018-10-26 DIAGNOSIS — Z1589 Genetic susceptibility to other disease: Secondary | ICD-10-CM | POA: Diagnosis not present

## 2018-10-26 MED ORDER — METHOTREXATE SODIUM CHEMO INJECTION 250 MG/10ML: INTRAMUSCULAR | 0 refills | Status: DC

## 2018-10-26 NOTE — Patient Instructions (Signed)
Standing Labs We placed an order today for your standing lab work.    Please come back and get your standing labs in August and every 3 months   We have open lab Monday through Friday from 8:30-11:30 AM and 1:30-4:00 PM  at the office of Dr. Shaili Deveshwar.   You may experience shorter wait times on Monday and Friday afternoons. The office is located at [address], [address], [place], [place] [address] No appointment is necessary.   Labs are drawn by [facility].  You may receive a bill from [facility] for your lab work.  If you wish to have your labs drawn at another location, please call the office 24 hours in advance to send orders.  If you have any questions regarding directions or hours of operation,  please call [phone].   Just as a reminder please drink plenty of water prior to coming for your lab work. Thanks!   

## 2018-10-27 LAB — CBC WITH DIFFERENTIAL/PLATELET
Absolute Monocytes: 537 cells/uL (ref 200–950)
Basophils Absolute: 30 cells/uL (ref 0–200)
Basophils Relative: 0.5 %
Eosinophils Absolute: 189 cells/uL (ref 15–500)
Eosinophils Relative: 3.2 %
HCT: 43.4 % (ref 35.0–45.0)
Hemoglobin: 14.9 g/dL (ref 11.7–15.5)
Lymphs Abs: 2342 cells/uL (ref 850–3900)
MCH: 32.5 pg (ref 27.0–33.0)
MCHC: 34.3 g/dL (ref 32.0–36.0)
MCV: 94.6 fL (ref 80.0–100.0)
MPV: 11 fL (ref 7.5–12.5)
Monocytes Relative: 9.1 %
Neutro Abs: 2803 cells/uL (ref 1500–7800)
Neutrophils Relative %: 47.5 %
Platelets: 289 10*3/uL (ref 140–400)
RBC: 4.59 10*6/uL (ref 3.80–5.10)
RDW: 13.1 % (ref 11.0–15.0)
Total Lymphocyte: 39.7 %
WBC: 5.9 10*3/uL (ref 3.8–10.8)

## 2018-10-27 LAB — COMPLETE METABOLIC PANEL WITH GFR
AG Ratio: 1.8 (calc) (ref 1.0–2.5)
ALT: 29 U/L (ref 6–29)
AST: 21 U/L (ref 10–35)
Albumin: 4.1 g/dL (ref 3.6–5.1)
Alkaline phosphatase (APISO): 57 U/L (ref 37–153)
BUN: 18 mg/dL (ref 7–25)
CO2: 30 mmol/L (ref 20–32)
Calcium: 10.1 mg/dL (ref 8.6–10.4)
Chloride: 103 mmol/L (ref 98–110)
Creat: 0.82 mg/dL (ref 0.50–1.05)
GFR, Est African American: 94 mL/min/{1.73_m2} (ref >=60)
GFR, Est Non African American: 81 mL/min/{1.73_m2} (ref >=60)
Globulin: 2.3 g/dL (calc) (ref 1.9–3.7)
Glucose, Bld: 93 mg/dL (ref 65–99)
Potassium: 4.3 mmol/L (ref 3.5–5.3)
Sodium: 142 mmol/L (ref 135–146)
Total Bilirubin: 0.5 mg/dL (ref 0.2–1.2)
Total Protein: 6.4 g/dL (ref 6.1–8.1)

## 2018-10-27 NOTE — Progress Notes (Signed)
CBC and CMP WNL

## 2018-11-09 ENCOUNTER — Telehealth: Payer: Self-pay | Admitting: Family Medicine

## 2018-11-09 ENCOUNTER — Other Ambulatory Visit: Payer: Self-pay | Admitting: *Deleted

## 2018-11-09 DIAGNOSIS — E782 Mixed hyperlipidemia: Secondary | ICD-10-CM

## 2018-11-09 DIAGNOSIS — E039 Hypothyroidism, unspecified: Secondary | ICD-10-CM

## 2018-11-09 NOTE — Telephone Encounter (Signed)
That's fine

## 2018-11-09 NOTE — Telephone Encounter (Signed)
Patient said the only time she has to do any type of visit is either June or July. She wanted to switch her visit Monday to CPE on December 30, 2018 with labs on December 28, 2018. Hope that is ok.

## 2018-11-09 NOTE — Telephone Encounter (Signed)
Pt has sched a VIRTUAL med check on 11/16/18, she needs refill on Synthroid.  She will be out of meds on Friday.  Please refill to [facility] on [address].  She also wants to know if she can use recent labs.

## 2018-11-09 NOTE — Telephone Encounter (Signed)
Chart was reviewed.  She hasn't had TSH since October, and at that point, her TSH was borderline (over 4).  At that time, her lipids were also above goal.  CBC and c-met just done recently, ok.  She hasn't had CPE this year (just a med check with Vickie 03/2018), and isn't scheduled for one.  I recommend scheduling one for 6 months.  Looks like her synthroid was refilled 09/22/18 for #30 with 5 refills (done by Maudie Mercury).  She should check with her pharmacy, as there should be refills.  I'd like TSH and lipids done, preferably before her visit (can r/s visit to later, since she should have meds), and be scheduled for a CPE.

## 2018-11-16 ENCOUNTER — Encounter: Payer: BC Managed Care – PPO | Admitting: Family Medicine

## 2018-11-20 ENCOUNTER — Other Ambulatory Visit: Payer: Self-pay | Admitting: Rheumatology

## 2018-11-23 ENCOUNTER — Telehealth: Payer: Self-pay | Admitting: Rheumatology

## 2018-11-23 NOTE — Telephone Encounter (Addendum)
Last Visit: 10/26/2018 Next Visit: message sent to the front desk to schedule.   Okay to refill virt gard?

## 2018-11-23 NOTE — Telephone Encounter (Signed)
Ok to refill 

## 2018-11-23 NOTE — Telephone Encounter (Signed)
-----   Message from Whitley sent at 11/23/2018  8:37 AM EDT ----- Please call patient to schedule follow up appointment. Patient is due October 2020. Thanks!

## 2018-11-23 NOTE — Telephone Encounter (Signed)
I LMOM for patient to call ,and schedule a follow up appointment with Dr. Estanislado Pandy for October.

## 2018-12-21 ENCOUNTER — Telehealth: Payer: Self-pay | Admitting: Rheumatology

## 2018-12-21 NOTE — Telephone Encounter (Signed)
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  Patient is requesting a return call to discuss the risk factors since she will be having direct contact with approximately 75 students and indirect contact with the rest of the student body.  Patient is also asking if Dr. Estanislado Pandy would be willing to write a letter "stating due to taking immunosuppressant medication it would be recommended to teach online."

## 2018-12-21 NOTE — Telephone Encounter (Signed)
Dr. 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

## 2018-12-22 ENCOUNTER — Encounter: Payer: Self-pay | Admitting: *Deleted

## 2018-12-22 NOTE — Telephone Encounter (Signed)
Left message to advise patient letter is ready for pick up.  

## 2018-12-27 ENCOUNTER — Other Ambulatory Visit: Payer: Self-pay | Admitting: Family Medicine

## 2018-12-27 DIAGNOSIS — R7303 Prediabetes: Secondary | ICD-10-CM

## 2018-12-27 NOTE — Progress Notes (Signed)
Chief Complaint  Patient presents with  . Annual Exam    nonfasting annual exam. No concerns. Patient declines breast/pelvic exam.     Jill Mathews is a 53 y.o. female who presents for a complete physical.  She has the following concerns:  Hypertension follow-up: Blood pressures at home are running mid to high 120's/mid to low 70's.  She reports compliance with benazepril and HCTZ.  Denies dizziness, headaches, chest pain, edema. Denies side effects of medications, muscle cramps (occasionally leg twitches/jumpy at night), cough.   Hyperlipidemia:h/o borderline cholesterol, which improved with dietary changes.   Last check in 03/2018 was much higher than prior check.  Current diet: Breakfast: 1 egg daily (in casserole--12 eggs/12 servings), sausage/cheese, english muffin (high fiber), fruit. Lunch: Atkins shake (160 cal, high protein) Dinner: chicken strips with salad--since 7/4, eating more burgers (80/20), or ground beef with beans/corn/veggies in a 50cal tortilla for burritos. 1/2 sweet potato with 2 tsp butter.  Admits to "bored eating", snacking more.  Had lots of fresh strawberries, now eating a lot of cherries (large portions). She tries to avoid carbs Keto diet in Fall 2019 caused bad flare of RA, lasted through March. 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  At CPE 10/2016 (when cholesterol was much better), she reported her diet to be: 1 egg daily, along with 2 whites (has chickens); eating more salads (no longer uses feta cheese), vinaigrette dressing, with salmon or tuna.  Eating a lot more chicken. Continues to eat red meat at least 1-2x/week (venison, steak (rare)).    Lab Results  Component Value Date   CHOL 249 (H) 03/20/2018   HDL 50 03/20/2018   LDLCALC 159 (H) 03/20/2018   TRIG 202 (H) 03/20/2018   CHOLHDL 5.0 (H) 03/20/2018   Hypothyroidism: Compliant with taking Synthroid 137 mcg daily, on an empty  stomach, separate from her vitamins. No hair/skin/bowel changes.  Energy is good or weight  Lab Results  Component Value Date   TSH 4.380 03/20/2018   H/o pre-diabetes. She had elevated A1c in 2016 of 6.1.  Last check in 03/2018 was: Lab Results  Component Value Date   HGBA1C 5.9 (H) 03/20/2018  She limits carbs, but reports eating a lot of fruit lately.  Also, not exercising as much (see below).  RA: Under the care of Dr. Estanislado Pandy.  She was last seen in May.  She is doing well on Humira and methotrexate. Last flare was in the Fall, didn't resolve until March).  Per note, pneumovax booster is recommended. c-met and CBC are monitored through her office:  Lab Results  Component Value Date   WBC 5.9 10/26/2018   HGB 14.9 10/26/2018   HCT 43.4 10/26/2018   MCV 94.6 10/26/2018   PLT 289 10/26/2018     Chemistry      Component Value Date/Time   NA 142 10/26/2018 1136   NA 143 03/20/2018 1655   K 4.3 10/26/2018 1136   CL 103 10/26/2018 1136   CO2 30 10/26/2018 1136   BUN 18 10/26/2018 1136   BUN 21 03/20/2018 1655   CREATININE 0.82 10/26/2018 1136      Component Value Date/Time   CALCIUM 10.1 10/26/2018 1136   ALKPHOS 72 03/20/2018 1655   AST 21 10/26/2018 1136   ALT 29 10/26/2018 1136   BILITOT 0.5 10/26/2018 1136   BILITOT 0.3 03/20/2018 1655     glucose 93.   Immunization History  Administered Date(s) Administered  .  Influenza Split 03/10/2012  . Pneumococcal Conjugate-13 11/01/2015  . Pneumococcal Polysaccharide-23 07/02/2006  . Tdap 09/09/2012   Last Pap smear: Age 70--had a very bad experience and refused with me at prior physicals. Last mammogram: never and refuses Last colonoscopy: never, but is willing to have (age 30 is what she states, unsure why) Last DEXA: never (discussed and ordered 10/2016, never scheduled) Dentist: every 6 months Ophtho: yearly (due to meds), more often related to complication Exercise:Walks 2 miles daily with the dog. Not  hiking much due to RA flaring (well into March), stress, rainy weather and now the heat. Sits a lot at her mother's since [place].  Normal vitamin D level (36) in 05/2012  Past Medical History:  Diagnosis Date  . Hyperlipidemia    borderline; dietary management  . Hypertension   . Hypothyroidism 2001  . Rheumatoid arthritis(714.0) 04/2006   Dr. Estanislado Pandy manages    Past Surgical History:  Procedure Laterality Date  . 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 PILONIDAL CYST EXCISION  1983  . TONSILLECTOMY  age 15  . WISDOM TOOTH EXTRACTION      Social History   Socioeconomic History  . Marital status: Married    Spouse name: Not on file  . Number of children: Not on file  . Years of education: Not on file  . Highest education level: Not on file  Occupational History  . 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 Financial resource strain: Not on file  . Food insecurity    Worry: Not on file    Inability: Not on file  . Transportation needs    Medical: Not on file    Non-medical: Not on file  Tobacco Use  . Smoking status: Former Smoker    Packs/day: 1.50    Years: 26.00    Pack years: 39.00    Types: Cigarettes    Start date: 01/08/2005    Quit date: 2006    Years since quitting: 14.5  . Smokeless tobacco: Former Systems developer    Types: Chew  . Tobacco comment: only when playing softball (2 years), when they couldn't smoke (age [age])  Substance and Sexual Activity  . Alcohol use: Yes    Alcohol/week: 1.0 standard drinks    Types: 1 Glasses of wine per week    Comment: occ, less than one/week  . Drug use: Never  . Sexual activity: Yes    Partners: Female  Lifestyle  . Physical activity    Days per week: Not on file    Minutes per session: Not on file  . Stress: Not on file  Relationships  . Social Herbalist on phone: Not on file    Gets together: Not on file     Attends religious service: Not on file    Active member of club or organization: Not on file    Attends meetings of clubs or organizations: Not on file    Relationship status: Not on file  . Intimate partner violence    Fear of current or ex partner: Not on file    Emotionally abused: Not on file    Physically abused: Not on file    Forced sexual activity: Not on file  Other Topics Concern  . Not on file  Social History Narrative   Lives with female partner of 42 years--got married 06/11/13. 3 cats, 1 dog      (  had to slaughter chicken in order to isolate dog after incident with rabid fox getting into yard; one dog wasn't vaccinated--had to put down)    Family History  Problem Relation Age of Onset  . Heart disease Mother 61       MI, stents x 2; CABG age 44  . Stroke Mother 54  . Hyperlipidemia Mother   . Hypertension Mother   . Depression Mother   . Diabetes Mother   . Thyroid disease Mother        Graves disease, s/p RAI, now hypothyroid  . Irritable bowel syndrome Mother   . COPD Mother   . Colon polyps Mother   . Cancer Mother        skin (not melanoma)  . Kidney Stones Mother   . Stroke Father 94  . Hyperlipidemia Father   . Hypertension Father   . Depression Father   . Diabetes Father   . Thyroid disease Father        hypothyroidism  . COPD Father   . Heart disease Father 64       MI; s/p CABG; pacemaker  . Cancer Sister 38       pancreatic cancer  . Bipolar disorder Sister   . Lupus Paternal Aunt   . Heart disease Maternal Uncle        s/p CABG, late 70's  . Cancer Maternal Grandfather        laryngeal, smoker    Outpatient Encounter Medications as of 12/30/2018  Medication Sig  . aspirin 81 MG tablet Take 81 mg by mouth every other day.   . benazepril (LOTENSIN) 10 MG tablet TAKE 1 TABLET BY MOUTH EVERY DAY  . Calcium-Magnesium-Vitamin D (CALCIUM MAGNESIUM PO) Take by mouth daily.  . fish oil-omega-3 fatty acids 1000 MG capsule Take 1 g by mouth 2  (two) times daily.   Marland Kitchen HUMIRA PEN 40 MG/0.4ML PNKT INJECT 1 PEN UNDER THE SKIN EVERY 14 DAYS.  . hydrochlorothiazide (HYDRODIURIL) 25 MG tablet TAKE 1 TABLET BY MOUTH EVERY DAY  . loratadine (CLARITIN) 10 MG tablet Take 10 mg by mouth as needed for allergies.  Marland Kitchen MELATONIN PO Take by mouth as needed.  . methotrexate 250 MG/10ML injection INJECT 0.6 ML SUBCUTANEOUS EVERY WEEK  . Multiple Vitamins-Minerals (MULTIVITAMIN ADULT PO) Take by mouth daily.  Marland Kitchen OVER THE COUNTER MEDICATION   . SYNTHROID 137 MCG tablet TAKE 1 TABLET(137 MCG) BY MOUTH DAILY  . VIRT-GARD 2.2-25-1 MG TABS TAKE 1 TABLET BY MOUTH DAILY  . ibuprofen (ADVIL,MOTRIN) 200 MG tablet Take 200 mg by mouth every 8 (eight) hours as needed.   No facility-administered encounter medications on file as of 12/30/2018.     Allergies  Allergen Reactions  . Penicillins Rash   ROS: The patient denies anorexia, fever, headaches, vision changes (pain resolved), decreased hearing, ear pain, sore throat, breast concerns, chest pain, palpitations, dizziness, syncope, dyspnea on exertion, cough, swelling, nausea, vomiting, diarrhea, constipation, abdominal pain, melena, hematochezia, indigestion/heartburn, hematuria, incontinence, dysuria, vaginal discharge, odor or itch, genital lesions, numbness, tingling, weakness, tremor, suspicious skin lesions, depression, anxiety, abnormal bleeding/bruising, or enlarged lymph nodes. Had issue with L eye in 12020--severe pain, no vision changes.  Resolved.  Last period was over 5 years ago. No longer has hot flashes. Arthritis pain at wrists, elbows, shoulders, knees, ankles, hips--only intermittent. And also left 5th toe. Denies pain currently. Occasional insomnia, sporadic, doesn't use medications. Denies any change to the mole on her nose. She reports she  was up to 263.2# at home, and was down 250.8 on her home scale this morning.   PHYSICAL EXAM:  BP 122/86   Pulse 80   Temp 97.7 F (36.5 C)  (Temporal)   Ht '5\' 7"'  (1.702 m)   Wt 256 lb 9.6 oz (116.4 kg)   LMP 01/13/2013   BMI 40.19 kg/m   Wt Readings from Last 3 Encounters:  10/26/18 258 lb 12.8 oz (117.4 kg)  07/28/18 253 lb 0.6 oz (114.8 kg)  03/31/18 249 lb 6.4 oz (113.1 kg)  wt was 247# at her last CPE 2 years ago (10/2016)  General Appearance:   Alert, cooperative, no distress, appears stated age. She declined to get into a gown, refused breast and pelvic exam, unable to perform good skin exam as well.  Head:   Normocephalic, without obvious abnormality, atraumatic  Eyes:   PERRL, conjunctiva/corneas clear, EOM's intact, fundi benign  Ears:   Normal TM's and external ear canals  Nose:  Not examined (wearing mask due to COVID-19 pandemic)  Throat:  Not examined (wearing mask due to COVID-19 pandemic)  Neck:  Supple, no lymphadenopathy; thyroid: no enlargement/ tenderness/nodules; no carotid bruit or JVD  Back:  Spine nontender, no curvature, ROM normal, no CVA tenderness  Lungs:   Clear to auscultation bilaterally without wheezes, rales or ronchi; respirations unlabored  Chest Wall:   No tenderness or deformity  Heart:   Regular rate and rhythm, S1 and S2 normal, no murmur, rub or gallop  Breast Exam:   exam refused by patient  Abdomen:   Soft, non-tender, nondistended, normoactive bowel sounds, no masses, no hepatosplenomegaly  Genitalia:   Refused by patient     Extremities:  No clubbing, cyanosis or edema  Pulses:  2+ and symmetric all extremities  Skin:  Skin color, texture, turgor normal, no rashes.   Lymph nodes:  Cervical, supraclavicular nodes normal  Neurologic:  CNII-XII intact, normal strength, sensation and gait; reflexes 2+ and symmetric throughout    Psych: Normal mood, affect, hygiene and grooming  Lab Results  Component Value Date   TSH 1.880 12/28/2018   Lab Results  Component  Value Date   CHOL 222 (H) 12/28/2018   HDL 46 12/28/2018   LDLCALC 142 (H) 12/28/2018   TRIG 170 (H) 12/28/2018   CHOLHDL 4.8 (H) 12/28/2018   Lab Results  Component Value Date   HGBA1C 6.0 (H) 12/28/2018     ASSESSMENT/PLAN:  Annual physical exam   Hypothyroidism, unspecified type - adequately replaced   Mixed hyperlipidemia - some improvement, but still above goal (prefer LDL <130). Counseled extensively re: diet, refuses statin trial; consider if not improved on recheck   Essential hypertension, benign - controlled, diastolic a little high today. Cont home monitor, low Na diet; encouraged weight loss  Prediabetes - counseled re: diet, cut back on fruits and eat more vegetables, increase exercise, and wt loss rec   Rheumatoid arthritis involving multiple sites with positive rheumatoid factor (HCC) - stable on current regimen per rheum   Need for pneumococcal vaccination - Plan: Pneumococcal polysaccharide vaccine 23-valent greater than or equal to 2yo subcutaneous/IM  Class 3 severe obesity due to excess calories with serious comorbidity and body mass index (BMI) of 40.0 to 44.9 in adult [facility]) - counseled re: risks of obesity, encouraged significant wt loss. Discussed diet, exercise    Pneumovax booster (as recommended by her rheum)--given today, risks/SE reviewed in detail Shingrix recommended. Risks/SE reviewed, to check insurance and schedule [place] when  convenient.  Discussed DEXA given postmenopausal and frequent steroid use due to RA. She declines.  Discussed metformin in the setting of obesity and pre-diabetes.  She declines this.  Declines starting statin--will work on diet, and re-address statin if not at goal. Reviewed her diet when lipids were better.  Discussed cutting back on egg yolks, using ground [place] instead of beef, using less butter, etc.   Discussed monthly self breast exams and yearly mammograms (past due, declines); at least 30 minutes of aerobic  activity at least 5 days/week, weight-bearing exercise at least 2x/week; proper sunscreen use reviewed; healthy diet, including goals of calcium and vitamin D intake and alcohol recommendations (less than or equal to 1 drink/day) reviewed; regular seatbelt use; changing batteries in smoke detectors. Immunization recommendations discussed--Yearly flu shots are recommended.. Shingrix recommended, risks/SE reviewed. pneumovax given Colonoscopy recommendations reviewed--past due.  Discussed Colonoscopy vs Cologuard discussed in detail, she did not make decision about this today. Discussed recommendation for DEXA (given h/o frequent prednisone use in past), declined by pt.   Counseled extensively regarding risks associated with delayed screening for breast cancer, female cancers, colon cancer. Strongly encouraged her to reconsider pelvic/pap, and to get 3D mammogram and colonoscopy or Cologuard  Discussed Crestor, and potential for NOT daily use, if that helps in convincing her to start statin (if not at goal).  Risks/SE reviewed.  Recheck 3-4 months--fasting glucose and lipids. Lab visit only, OV to follow if needed.  F/u 1 year for CPE.  At least 30 minutes of today's visit was spent counseling, beyond routine wellness visit, due to her abnormal labs, as well as refusal of HM issues.

## 2018-12-27 NOTE — Patient Instructions (Addendum)
  HEALTH MAINTENANCE RECOMMENDATIONS:  It is recommended that you get at least 30 minutes of aerobic exercise at least 5 days/week (for weight loss, you may need as much as 60-90 minutes). This can be any activity that gets your heart rate up. This can be divided in 10-15 minute intervals if needed, but try and build up your endurance at least once a week.  Weight bearing exercise is also recommended twice weekly.  Eat a healthy diet with lots of vegetables, fruits and fiber.  "Colorful" foods have a lot of vitamins (ie green vegetables, tomatoes, red peppers, etc).  Limit sweet tea, regular sodas and alcoholic beverages, all of which has a lot of calories and sugar.  Up to 1 alcoholic drink daily may be beneficial for women (unless trying to lose weight, watch sugars).  Drink a lot of water.  Calcium recommendations are 1200-1500 mg daily (1500 mg for postmenopausal women or women without ovaries), and vitamin D 1000 IU daily.  This should be obtained from diet and/or supplements (vitamins), and calcium should not be taken all at once, but in divided doses.  Monthly self breast exams and yearly mammograms for women over the age of 73 is recommended. Please call the [facility] to schedule a 3D screening mammogram. Yearly breast and pelvic exams, including pap smears, are recommended.  Sunscreen of at least SPF 30 should be used on all sun-exposed parts of the skin when outside between the hours of 10 am and 4 pm (not just when at beach or pool, but even with exercise, golf, tennis, and yard work!)  Use a sunscreen that says "broad spectrum" so it covers both UVA and UVB rays, and make sure to reapply every 1-2 hours.  Remember to change the batteries in your smoke detectors when changing your clock times in the spring and fall. Carbon monoxide detectors are recommended for your home.  Use your seat belt every time you are in a car, and please drive safely and not be distracted with cell phones and  texting while driving.  I recommend getting the new shingles vaccine (Shingrix). You will need to check with your insurance to see if it is covered, and if covered, schedule a nurse visit at our office when convenient (given the potential side effects we discussed).  It is a series of 2 injections, spaced 2 months apart.  We discussed colonoscopy versus Cologuard testing for screening for colon cancer.  Please let us know which you decide so that we can put in the referral.  We discussed bone density tests to evaluate for thinning of the bones.  Let us know if you change your mind, and feel free to discuss with Dr. Estanislado Pandy.  Try and use more egg whites and fewer egg yolks. Try and cut back on the ground beef--more fish, chicken and ground [place] as substitutes. Cut back on the butter. Limit cheese portion/frequency.  You elected to hold off on medications and work on diet. We should recheck the cholesterol in 3-6 months, and if still elevated, I think we should start rosuvastatin (Crestor).  If you have tolerability issues (rare with this one), it can be taken every other day instead.

## 2018-12-28 ENCOUNTER — Other Ambulatory Visit: Payer: BC Managed Care – PPO

## 2018-12-28 ENCOUNTER — Other Ambulatory Visit: Payer: Self-pay

## 2018-12-28 DIAGNOSIS — R7303 Prediabetes: Secondary | ICD-10-CM

## 2018-12-28 DIAGNOSIS — E039 Hypothyroidism, unspecified: Secondary | ICD-10-CM

## 2018-12-28 DIAGNOSIS — E782 Mixed hyperlipidemia: Secondary | ICD-10-CM

## 2018-12-29 LAB — HEMOGLOBIN A1C
Est. average glucose Bld gHb Est-mCnc: 126 mg/dL
Hgb A1c MFr Bld: 6 % — ABNORMAL HIGH (ref 4.8–5.6)

## 2018-12-29 LAB — LIPID PANEL
Chol/HDL Ratio: 4.8 ratio — ABNORMAL HIGH (ref 0.0–4.4)
Cholesterol, Total: 222 mg/dL — ABNORMAL HIGH (ref 100–199)
HDL: 46 mg/dL (ref >39)
LDL Calculated: 142 mg/dL — ABNORMAL HIGH (ref 0–99)
Triglycerides: 170 mg/dL — ABNORMAL HIGH (ref 0–149)
VLDL Cholesterol Cal: 34 mg/dL (ref 5–40)

## 2018-12-29 LAB — TSH: TSH: 1.88 u[IU]/mL (ref 0.450–4.500)

## 2018-12-30 ENCOUNTER — Ambulatory Visit (INDEPENDENT_AMBULATORY_CARE_PROVIDER_SITE_OTHER): Payer: BC Managed Care – PPO | Admitting: Family Medicine

## 2018-12-30 ENCOUNTER — Other Ambulatory Visit: Payer: Self-pay

## 2018-12-30 ENCOUNTER — Encounter: Payer: Self-pay | Admitting: Family Medicine

## 2018-12-30 VITALS — BP 122/86 | HR 80 | Temp 97.7°F | Ht 67.0 in | Wt 256.6 lb

## 2018-12-30 DIAGNOSIS — M0579 Rheumatoid arthritis with rheumatoid factor of multiple sites without organ or systems involvement: Secondary | ICD-10-CM

## 2018-12-30 DIAGNOSIS — Z0000 Encounter for general adult medical examination without abnormal findings: Secondary | ICD-10-CM | POA: Diagnosis not present

## 2018-12-30 DIAGNOSIS — Z23 Encounter for immunization: Secondary | ICD-10-CM | POA: Diagnosis not present

## 2018-12-30 DIAGNOSIS — R7303 Prediabetes: Secondary | ICD-10-CM

## 2018-12-30 DIAGNOSIS — E039 Hypothyroidism, unspecified: Secondary | ICD-10-CM | POA: Diagnosis not present

## 2018-12-30 DIAGNOSIS — Z6841 Body Mass Index (BMI) 40.0 and over, adult: Secondary | ICD-10-CM

## 2018-12-30 DIAGNOSIS — E782 Mixed hyperlipidemia: Secondary | ICD-10-CM

## 2018-12-30 DIAGNOSIS — I10 Essential (primary) hypertension: Secondary | ICD-10-CM

## 2019-01-25 ENCOUNTER — Other Ambulatory Visit: Payer: Self-pay | Admitting: Family Medicine

## 2019-01-25 DIAGNOSIS — I10 Essential (primary) hypertension: Secondary | ICD-10-CM

## 2019-03-10 NOTE — Progress Notes (Signed)
Office Visit Note  [name]             Date of Birth: 1966-03-21           MRN: [id number]             PCP: Rita Ohara, MD Referring: Rita Ohara, MD Visit Date: 03/24/2019 Occupation: '@GUAROCC' @  Subjective:  Pain in multiple joints  History of Present Illness: Jill Mathews is a 53 y.o. female with history of seropositive arthritis and keratitis.  She is on Humira 40 mg every 14 days, methotrexate 0.6 mg every 7 days, and Virt-Gard daily.  She has noticed significant worsening of symptoms since August.  She has joint pain and swelling in bilateral knees, ankles, and shoulders.  She has increased fatigue.  She states that rain worsens joint pain. She is typically a very active person but her symptoms are preventing her from doing the outdoor activities she loves. Her wife has had to help her with some of activities of daily living.  She had significant decline in quality of life.  Activities of Daily Living:  Patient reports morning stiffness for 10 minutes.   Patient Reports nocturnal pain.  Difficulty dressing/grooming: Denies Difficulty climbing stairs: Reports Difficulty getting out of chair: Reports Difficulty using hands for taps, buttons, cutlery, and/or writing: Reports  Review of Systems  Constitutional: Positive for fatigue. Negative for night sweats, weight gain and weight loss.  HENT: Positive for mouth dryness. Negative for mouth sores, trouble swallowing, trouble swallowing and nose dryness.   Eyes: Positive for dryness. Negative for pain, redness and visual disturbance.  Respiratory: Positive for shortness of breath. Negative for cough and difficulty breathing.   Cardiovascular: Positive for swelling in legs/feet. Negative for chest pain, palpitations, hypertension and irregular heartbeat.  Gastrointestinal: Positive for constipation. Negative for blood in stool and diarrhea.  Endocrine: Negative for heat intolerance and increased urination.   Genitourinary: Negative for painful urination and vaginal dryness.  Musculoskeletal: Positive for arthralgias, gait problem, joint pain, joint swelling, muscle weakness, morning stiffness and muscle tenderness. Negative for myalgias and myalgias.  Skin: Negative for color change, rash, hair loss, skin tightness, ulcers and sensitivity to sunlight.  Allergic/Immunologic: Negative for susceptible to infections.  Neurological: Positive for headaches. Negative for dizziness, memory loss, night sweats and weakness.  Hematological: Negative for bruising/bleeding tendency and swollen glands.  Psychiatric/Behavioral: Positive for sleep disturbance. Negative for depressed mood. The patient is not nervous/anxious.     PMFS History:  Patient Active Problem List   Diagnosis Date Noted  . HLA B27 (HLA B27 positive) 10/24/2016  . Prediabetes 10/31/2014  . Mixed hyperlipidemia 09/09/2012  . Hypothyroidism 05/25/2012  . Essential hypertension, benign 05/25/2012  . Rheumatoid arthritis involving multiple sites with positive rheumatoid factor ([place]) 05/25/2012  . Obesity (BMI 30-39.9) 05/25/2012    Past Medical History:  Diagnosis Date  . Hyperlipidemia    borderline; dietary management  . Hypertension   . Hypothyroidism 2001  . Rheumatoid arthritis(714.0) 04/2006   Dr. Estanislado Pandy manages    Family History  Problem Relation Age of Onset  . Heart disease Mother 69       MI, stents x 2; CABG age 29  . Stroke Mother 60  . Hyperlipidemia Mother   . Hypertension Mother   . Depression Mother   . Diabetes Mother   . Thyroid disease Mother        Graves disease, s/p RAI, now hypothyroid  . Irritable bowel syndrome Mother   .  COPD Mother   . Colon polyps Mother   . Cancer Mother        skin (not melanoma)  . Kidney Stones Mother   . Stroke Father 35  . Hyperlipidemia Father   . Hypertension Father   . Depression Father   . Diabetes Father   . Thyroid disease Father        hypothyroidism  .  COPD Father   . Heart disease Father 22       MI; s/p CABG; pacemaker  . Cancer Sister 87       pancreatic cancer  . Bipolar disorder Sister   . Lupus Paternal Aunt   . Heart disease Maternal Uncle        s/p CABG, late 29's  . Cancer Maternal Grandfather        laryngeal, smoker   Past Surgical History:  Procedure Laterality Date  . 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 PILONIDAL CYST EXCISION  1983  . TONSILLECTOMY  age 67  . WISDOM TOOTH EXTRACTION     Social History   Social History Narrative   Lives with female partner of 69 years--got married 06/11/13. 3 cats, 1 dog      (had to slaughter chicken in order to isolate dog after incident with rabid fox getting into yard; one dog wasn't vaccinated--had to put down)      Teaches biology at [employer] History  Administered Date(s) Administered  . Influenza Split 03/10/2012  . Pneumococcal Conjugate-13 11/01/2015  . Pneumococcal Polysaccharide-23 07/02/2006, 12/30/2018  . Tdap 09/09/2012     Objective: Vital Signs: BP 119/78 (BP Location: Left Arm, Patient Position: Sitting, Cuff Size: Normal)   Pulse 69   Resp 16   Ht '5\' 7"'  (1.702 m)   Wt 253 lb 6.4 oz (114.9 kg)   LMP 01/13/2013   BMI 39.69 kg/m    Physical Exam Vitals signs and nursing note reviewed.  Constitutional:      Appearance: She is well-developed.  HENT:     Head: Normocephalic and atraumatic.  Eyes:     Conjunctiva/sclera: Conjunctivae normal.  Neck:     Musculoskeletal: Normal range of motion.  Cardiovascular:     Rate and Rhythm: Normal rate and regular rhythm.     Heart sounds: Normal heart sounds.  Pulmonary:     Effort: Pulmonary effort is normal.     Breath sounds: Normal breath sounds.  Abdominal:     General: Bowel sounds are normal.     Palpations: Abdomen is soft.  Lymphadenopathy:     Cervical: No cervical adenopathy.  Skin:    General: Skin is warm and dry.     Capillary Refill:  Capillary refill takes less than 2 seconds.  Neurological:     Mental Status: She is alert and oriented to person, place, and time.  Psychiatric:        Behavior: Behavior normal.      Musculoskeletal Exam: She is stiffness with range of motion of her cervical spine.  She had painful range of motion of bilateral shoulder joints.  She had discomfort range of motion of bilateral elbow joints, wrist joints MCPs and PIPs and DIPs.  She had tenderness on palpation of almost all of her joints but no swelling was noted.  She had discomfort range of motion of bilateral hip joints knee joints ankles MTPs PIPs and tenderness on examination as described below. CDAI Exam: CDAI Score:  17.2  Patient Global: 6 mm; Provider Global: 6 mm Swollen: 0 ; Tender: 28  Joint Exam      Right  Left  Glenohumeral   Tender   Tender  Elbow   Tender   Tender  Wrist   Tender   Tender  MCP 2   Tender   Tender  MCP 3   Tender   Tender  MCP 4   Tender   Tender  MCP 5   Tender   Tender  Knee   Tender   Tender  Ankle   Tender   Tender  MTP 1   Tender   Tender  MTP 2   Tender   Tender  MTP 3   Tender   Tender  MTP 4   Tender   Tender  MTP 5   Tender   Tender     Investigation: No additional findings.  Imaging: No results found.  Recent Labs: Lab Results  Component Value Date   WBC 5.9 10/26/2018   HGB 14.9 10/26/2018   PLT 289 10/26/2018   NA 142 10/26/2018   K 4.3 10/26/2018   CL 103 10/26/2018   CO2 30 10/26/2018   GLUCOSE 93 10/26/2018   BUN 18 10/26/2018   CREATININE 0.82 10/26/2018   BILITOT 0.5 10/26/2018   ALKPHOS 72 03/20/2018   AST 21 10/26/2018   ALT 29 10/26/2018   PROT 6.4 10/26/2018   ALBUMIN 4.6 03/20/2018   CALCIUM 10.1 10/26/2018   GFRAA 94 10/26/2018   QFTBGOLD NEGATIVE 05/05/2017   QFTBGOLDPLUS NEGATIVE 07/07/2018    Speciality Comments: Prior therapy includes: Enbrel (inadequate response), Plaquenil (inadequate response)   Procedures:  No procedures performed  Allergies: Penicillins   Assessment / Plan:     Visit Diagnoses: Rheumatoid arthritis involving multiple sites with positive rheumatoid factor (HCC) - +RF,+ anti-CCP -patient symptoms have generally worsened with increased pain and discomfort in multiple joints.  She had no synovitis but has severe discomfort.  She is significant morning stiffness.  She has been on Enbrel in the past and now on Humira for a long time.  I like to see the lab work before making further decisions.  The next choice will be dependent on the history of keratitis.  She may benefit from IV Remicade.  I will call a prednisone taper.  Plan: Sedimentation rate, Cyclic citrul peptide antibody, IgG  High risk medication use - Humira 40 mg every 14 days(started on February 2020) and methotrexate 0.6 ml every 7 days.  Last TB gold negative on 07/07/2018 and will monitor yearly.  Most recent CBC/CMP within normal limits on 10/26/2018.  Due for CBC/CMP today and will monitor every 3 months.  (Enbrel is discontinued due to recurrent keratitis). 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  She is on Humira 40 mg subcutaneous injections every 14 days.  HLA B27 (HLA B27 positive)  Prediabetes  History of obesity  History of hypothyroidism  History of hypertension  History of hyperlipidemia  Other fatigue - Plan: CK, TSH  Orders: Orders Placed This Encounter  Procedures  . CBC with Differential/Platelet  . COMPLETE METABOLIC PANEL WITH GFR  . Sedimentation rate  . CK  . TSH  . Cyclic citrul peptide antibody, IgG  . Serum protein electrophoresis with reflex   Meds ordered this encounter  Medications  . predniSONE (DELTASONE) 5  MG tablet    Sig: Take 4 tabs po qd x 4 days, 3  tabs po qd x 4 days, 2  tabs po qd x 4 days, 1  tab po qd x 4 days     Dispense:  40 tablet    Refill:  0    Face-to-face time spent with patient was 30 minutes. Greater than 50% of time was spent in counseling and coordination of care.  Follow-Up Instructions: Return in about 1 week (around 03/31/2019) for Rheumatoid arthritis.   Bo Merino, MD  Note - This record has been created using Editor, commissioning.  Chart creation errors have been sought, but may not always  have been located. Such creation errors do not reflect on  the standard of medical care.

## 2019-03-11 DIAGNOSIS — M797 Fibromyalgia: Secondary | ICD-10-CM

## 2019-03-11 HISTORY — DX: Fibromyalgia: M79.7

## 2019-03-23 ENCOUNTER — Other Ambulatory Visit: Payer: Self-pay | Admitting: Rheumatology

## 2019-03-23 NOTE — Telephone Encounter (Signed)
Last Visit: 10/26/18 Next Visit: 03/24/19 Labs: 10/26/18 WNL  Labs to be updated at appointment 03/24/19  Okay to refill 30 day supply per Dr. Estanislado Pandy

## 2019-03-24 ENCOUNTER — Encounter: Payer: Self-pay | Admitting: Rheumatology

## 2019-03-24 ENCOUNTER — Other Ambulatory Visit: Payer: Self-pay

## 2019-03-24 ENCOUNTER — Ambulatory Visit: Payer: BC Managed Care – PPO | Admitting: Rheumatology

## 2019-03-24 VITALS — BP 119/78 | HR 69 | Resp 16 | Ht 67.0 in | Wt 253.4 lb

## 2019-03-24 DIAGNOSIS — Z79899 Other long term (current) drug therapy: Secondary | ICD-10-CM | POA: Diagnosis not present

## 2019-03-24 DIAGNOSIS — H169 Unspecified keratitis: Secondary | ICD-10-CM | POA: Diagnosis not present

## 2019-03-24 DIAGNOSIS — Z8639 Personal history of other endocrine, nutritional and metabolic disease: Secondary | ICD-10-CM

## 2019-03-24 DIAGNOSIS — Z1589 Genetic susceptibility to other disease: Secondary | ICD-10-CM | POA: Diagnosis not present

## 2019-03-24 DIAGNOSIS — Z8679 Personal history of other diseases of the circulatory system: Secondary | ICD-10-CM

## 2019-03-24 DIAGNOSIS — R5383 Other fatigue: Secondary | ICD-10-CM

## 2019-03-24 DIAGNOSIS — M0579 Rheumatoid arthritis with rheumatoid factor of multiple sites without organ or systems involvement: Secondary | ICD-10-CM | POA: Diagnosis not present

## 2019-03-24 DIAGNOSIS — R7303 Prediabetes: Secondary | ICD-10-CM

## 2019-03-24 MED ORDER — PREDNISONE 5 MG PO TABS: ORAL_TABLET | ORAL | 0 refills | Status: DC

## 2019-03-24 NOTE — Progress Notes (Addendum)
Pharmacy Note  Subjective: Patient presents today to the [facility] to see Dr. Estanislado Pandy for rheumatoid arthritis follow up.   Patient seen by the pharmacist for medication review. Current medication regimen includes Humira and methotrexate. Prior therapy includes Enbrel and Plaquenil with inadequate response.  She has noticed significant worsening of her RA symptoms and decrease in quality of life.  Medication Review:  Does the patient feel that his/her medications are effective in managing symptoms? No Has the patient been experiencing any side effects to the medications prescribed?   No Has the patient missed any doses in the past few weeks? No Does the patient have any problems obtaining medications from the pharmacy?   No Does the patient have difficulty affording their medications?  No  Objective: Current Outpatient Medications on File Prior to Visit  Medication Sig Dispense Refill  . aspirin 81 MG tablet Take 81 mg by mouth every other day.     . benazepril (LOTENSIN) 10 MG tablet TAKE 1 TABLET BY MOUTH EVERY DAY 30 tablet 2  . Calcium-Magnesium-Vitamin D (CALCIUM MAGNESIUM PO) Take by mouth 2 (two) times a day. 1/2 pill every 3 days    . fish oil-omega-3 fatty acids 1000 MG capsule Take 1 g by mouth 2 (two) times daily.     Marland Kitchen HUMIRA PEN 40 MG/0.4ML PNKT INJECT 1 PEN UNDER THE SKIN EVERY 14 DAYS. 6 each 0  . hydrochlorothiazide (HYDRODIURIL) 25 MG tablet TAKE 1 TABLET BY MOUTH EVERY DAY 30 tablet 2  . ibuprofen (ADVIL,MOTRIN) 200 MG tablet Take 200 mg by mouth every 8 (eight) hours as needed.    . loratadine (CLARITIN) 10 MG tablet Take 10 mg by mouth as needed for allergies.    Marland Kitchen MELATONIN PO Take by mouth as needed.    . methotrexate 250 MG/10ML injection INJECT 0.6 ML SUBCUTANEOUSLY EVERY WEEK 4 mL 0  . Multiple Vitamins-Minerals (MULTIVITAMIN ADULT PO) Take by mouth daily.    Marland Kitchen SYNTHROID 137 MCG tablet TAKE 1 TABLET(137 MCG) BY MOUTH DAILY 30 tablet 5  . VIRT-GARD  2.2-25-1 MG TABS TAKE 1 TABLET BY MOUTH DAILY 90 each 0  . OVER THE COUNTER MEDICATION      No current facility-administered medications on file prior to visit.      CBC    Component Value Date/Time   WBC 5.9 10/26/2018 1136   RBC 4.59 10/26/2018 1136   HGB 14.9 10/26/2018 1136   HGB 14.7 03/20/2018 1655   HCT 43.4 10/26/2018 1136   HCT 44.4 03/20/2018 1655   PLT 289 10/26/2018 1136   PLT 304 03/20/2018 1655   MCV 94.6 10/26/2018 1136   MCV 97 03/20/2018 1655   MCH 32.5 10/26/2018 1136   MCHC 34.3 10/26/2018 1136   RDW 13.1 10/26/2018 1136   RDW 14.4 03/20/2018 1655   LYMPHSABS 2,342 10/26/2018 1136   LYMPHSABS 2.4 03/20/2018 1655   MONOABS 664 12/26/2016 1539   EOSABS 189 10/26/2018 1136   EOSABS 0.1 03/20/2018 1655   BASOSABS 30 10/26/2018 1136   BASOSABS 0.0 03/20/2018 1655     CMP     Component Value Date/Time   NA 142 10/26/2018 1136   NA 143 03/20/2018 1655   K 4.3 10/26/2018 1136   CL 103 10/26/2018 1136   CO2 30 10/26/2018 1136   GLUCOSE 93 10/26/2018 1136   BUN 18 10/26/2018 1136   BUN 21 03/20/2018 1655   CREATININE 0.82 10/26/2018 1136   CALCIUM 10.1 10/26/2018 1136  PROT 6.4 10/26/2018 1136   PROT 7.1 03/20/2018 1655   ALBUMIN 4.6 03/20/2018 1655   AST 21 10/26/2018 1136   ALT 29 10/26/2018 1136   ALKPHOS 72 03/20/2018 1655   BILITOT 0.5 10/26/2018 1136   BILITOT 0.3 03/20/2018 1655   GFRNONAA 81 10/26/2018 1136   GFRAA 94 10/26/2018 1136     TB GOLD Quantiferon TB Gold Latest Ref Rng & Units 07/07/2018  Quantiferon TB Gold Plus NEGATIVE NEGATIVE     Assessment/Plan:  Humira 40 mg every 14 days and methotrexate 0.6 ml every 7 days.  Last TB gold negative on 07/07/2018 and will monitor yearly.  Most recent CBC/CMP within normal limits on 10/26/2018.  Due for CBC/CMP today and will monitor every 3 months. She received Pneumovax 23 in July.  Recommend annual influenza,  Prevnar 13, and Shingrix as indicated for immunosuppressant therapy.     All questions encouraged and answered.  Instructed patient to call with any questions or concerns.  Verlin Fester, PharmD, [place], CPP Clinical Specialty Pharmacist [phone]  03/24/2019 12:50 PM

## 2019-03-24 NOTE — Progress Notes (Signed)
Office Visit Note  [name]             Date of Birth: 12-Sep-1965           MRN: [id number]             PCP: Rita Ohara, MD Referring: Rita Ohara, MD Visit Date: 03/31/2019 Occupation: '@GUAROCC' @  Subjective:  Medication management.    History of Present Illness: Jill Mathews is a 53 y.o. female with history of seropositive rheumatoid arthritis.  She was having a flare at the last visit with increased pain and discomfort.  She was placed on prednisone.  She states after 4 days of prednisone she started feeling better.  She denies any joint pain or joint swelling today.  She is still on prednisone 10 mg p.o. daily.  Patient is concerned that she may have fibromyalgia.  She states she is under a lot of stress in her mother has fibromyalgia as well.  Activities of Daily Living:  Patient reports morning stiffness for 0 minutes.   Patient Denies nocturnal pain.  Difficulty dressing/grooming: Denies Difficulty climbing stairs: Denies Difficulty getting out of chair: Denies Difficulty using hands for taps, buttons, cutlery, and/or writing: Denies  Review of Systems  Constitutional: Positive for fatigue.  HENT: Negative for mouth sores, mouth dryness and nose dryness.   Eyes: Positive for dryness. Negative for itching.  Respiratory: Negative for shortness of breath, wheezing and difficulty breathing.   Cardiovascular: Negative for chest pain and palpitations.  Gastrointestinal: Negative for blood in stool, constipation and diarrhea.  Endocrine: Negative for increased urination.  Genitourinary: Negative for difficulty urinating and painful urination.  Musculoskeletal: Negative for arthralgias, joint pain, joint swelling and morning stiffness.  Skin: Negative for rash and hair loss.  Allergic/Immunologic: Negative for susceptible to infections.  Neurological: Positive for memory loss. Negative for dizziness, light-headedness, numbness, headaches and weakness.    Hematological: Negative for bruising/bleeding tendency.  Psychiatric/Behavioral: Negative for confusion. The patient is not nervous/anxious.     PMFS History:  Patient Active Problem List   Diagnosis Date Noted   HLA B27 (HLA B27 positive) 10/24/2016   Prediabetes 10/31/2014   Mixed hyperlipidemia 09/09/2012   Hypothyroidism 05/25/2012   Essential hypertension, benign 05/25/2012   Rheumatoid arthritis involving multiple sites with positive rheumatoid factor ([place]) 05/25/2012   Obesity (BMI 30-39.9) 05/25/2012    Past Medical History:  Diagnosis Date   Hyperlipidemia    borderline; dietary management   Hypertension    Hypothyroidism 2001   Rheumatoid arthritis(714.0) 04/2006   Dr. Estanislado Pandy manages    Family History  Problem Relation Age of Onset   Heart disease Mother 54       MI, stents x 2; CABG age 22   Stroke Mother 79   Hyperlipidemia Mother    Hypertension Mother    Depression Mother    Diabetes Mother    Thyroid disease Mother        Graves disease, s/p RAI, now hypothyroid   Irritable bowel syndrome Mother    COPD Mother    Colon polyps Mother    Cancer Mother        skin (not melanoma)   Kidney Stones Mother    Stroke Father 78   Hyperlipidemia Father    Hypertension Father    Depression Father    Diabetes Father    Thyroid disease Father        hypothyroidism   COPD Father    Heart disease Father 43  MI; s/p CABG; pacemaker   Cancer Sister 47       pancreatic cancer   Bipolar disorder Sister    Lupus Paternal Aunt    Heart disease Maternal Uncle        s/p CABG, late 60's   Cancer Maternal Grandfather        laryngeal, smoker   Past Surgical History:  Procedure Laterality Date   EXCISION MORTON'S NEUROMA  05/2017   right foot    PILONIDAL CYST EXCISION  55   TONSILLECTOMY  age 84   WISDOM TOOTH EXTRACTION     Social History   Social History Narrative   Lives with female partner of 81  years--got married 06/11/13. 3 cats, 1 dog      (had to slaughter chicken in order to isolate dog after incident with rabid fox getting into yard; one dog wasn't vaccinated--had to put down)      Teaches biology at [employer] History  Administered Date(s) Administered   Influenza Split 03/10/2012   Pneumococcal Conjugate-13 11/01/2015   Pneumococcal Polysaccharide-23 07/02/2006, 12/30/2018   Tdap 09/09/2012     Objective: Vital Signs: BP 116/69 (BP Location: Left Arm, Patient Position: Sitting, Cuff Size: Large)    Pulse 73    Resp 14    Ht '5\' 7"'  (1.702 m)    Wt 254 lb 12.8 oz (115.6 kg)    LMP 01/13/2013    BMI 39.91 kg/m    Physical Exam Vitals signs and nursing note reviewed.  Constitutional:      Appearance: She is well-developed.  HENT:     Head: Normocephalic and atraumatic.  Eyes:     Conjunctiva/sclera: Conjunctivae normal.  Neck:     Musculoskeletal: Normal range of motion.  Cardiovascular:     Rate and Rhythm: Normal rate and regular rhythm.     Heart sounds: Normal heart sounds.  Pulmonary:     Effort: Pulmonary effort is normal.     Breath sounds: Normal breath sounds.  Abdominal:     General: Bowel sounds are normal.     Palpations: Abdomen is soft.  Lymphadenopathy:     Cervical: No cervical adenopathy.  Skin:    General: Skin is warm and dry.     Capillary Refill: Capillary refill takes less than 2 seconds.  Neurological:     Mental Status: She is alert and oriented to person, place, and time.  Psychiatric:        Behavior: Behavior normal.      Musculoskeletal Exam: C-spine was in good range of motion.  She had bilateral trapezius spasm.  Shoulder joints, elbow joints, wrist joints, MCPs and PIPs and DIPs with good range of motion with no synovitis.  Hip joints, knee joints, ankles, MTPs and PIPs with good range of motion with no synovitis.  She had several tender points on examination.  She also had some  hyperalgesia.  CDAI Exam: CDAI Score: 0.2  Patient Global: 1 mm; Provider Global: 1 mm Swollen: 0 ; Tender: 0  Joint Exam   No joint exam has been documented for this visit   There is currently no information documented on the homunculus. Go to the Rheumatology activity and complete the homunculus joint exam.  Investigation: No additional findings.  Imaging: No results found.  Recent Labs: Lab Results  Component Value Date   WBC 9.3 03/24/2019   HGB 15.4 03/24/2019   PLT 313 03/24/2019   NA 140 03/24/2019   K  3.8 03/24/2019   CL 102 03/24/2019   CO2 29 03/24/2019   GLUCOSE 100 (H) 03/24/2019   BUN 19 03/24/2019   CREATININE 0.85 03/24/2019   BILITOT 0.6 03/24/2019   ALKPHOS 72 03/20/2018   AST 18 03/24/2019   ALT 25 03/24/2019   PROT 6.6 03/24/2019   PROT 6.7 03/24/2019   ALBUMIN 4.6 03/20/2018   CALCIUM 10.3 03/24/2019   GFRAA 89 03/24/2019   QFTBGOLD NEGATIVE 05/05/2017   QFTBGOLDPLUS NEGATIVE 07/07/2018  March 24, 2019 SPEP normal, CK 31, TSH normal, sed rate 11, anti-CCP> 250  Speciality Comments: Prior therapy includes: Enbrel (inadequate response), Plaquenil (inadequate response)   Procedures:  No procedures performed Allergies: Penicillins   Assessment / Plan:     Visit Diagnoses: Rheumatoid arthritis involving multiple sites with positive rheumatoid factor (HCC) - +RF,+ anti-CCP.  Patient was seen recently with increased joint pain and fatigue.  Although she did not have any synovitis.  She was given a prednisone taper.  She states after 4 days of prednisone she felt remarkably better.  She is on tapering schedule.  She had no synovitis on examination today.  We discussed continuing on Humira for right now.  If she develops another flare episode then I would be concerned about switching her to a different medication.  She also has history of keratitis the next choice will be Remicade.  Have given her a handout to review it.  I am also concerned if she has  a component of fibromyalgia.  She has generalized pain and positive tender points.  She also has history of hyperalgesia and positive family history fibromyalgia in her mother.  High risk medication use - Humira 40 mg every 14 days, started in February 2020 and methotrexate 0.6 ml every 7 days.  She had an adequate response to Enbrel. Last TB gold negative on 07/07/2018 and will monitor yearly.  Most recent CBC/CMP within normal limits on 03/24/2019.  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  HLA B27 (HLA B27 positive)  Morton's neuroma of right foot  Prediabetes  History of hypothyroidism  History of hypertension-her blood pressure is under control.  History of hyperlipidemia  Other fatigue-improved since the last visit.  Myofascial pain-she has been under a lot of stress.  She is also concerned that she may have a component of fibromyalgia.  She has multiple tender point in hyperalgesia.  There is family history of fibromyalgia in her mother.  I discussed the option of adding Cymbalta.  She is interested in starting on Cymbalta.  Indications side effects contraindications were discussed.  We will place her on Cymbalta 30 mg p.o. daily.  Orders: No orders of the defined types were placed in this encounter.  Meds ordered this encounter  Medications   DULoxetine (CYMBALTA) 30 MG capsule    Sig: Take 1 capsule (30 mg total) by mouth daily.    Dispense:  30 capsule    Refill:  2     Follow-Up Instructions: Return in about 3 months (around 07/01/2019) for Rheumatoid arthritis.   Bo Merino, MD  Note - This record has been created using Editor, commissioning.  Chart creation errors have been sought, but may not always  have been located. Such creation errors do not reflect on  the standard of medical care.

## 2019-03-25 ENCOUNTER — Other Ambulatory Visit: Payer: Self-pay | Admitting: Rheumatology

## 2019-03-25 DIAGNOSIS — M0579 Rheumatoid arthritis with rheumatoid factor of multiple sites without organ or systems involvement: Secondary | ICD-10-CM

## 2019-03-25 NOTE — Telephone Encounter (Signed)
Last Visit: 03/24/19 Next Visit: 03/31/19 Labs: 03/25/19 Glucose 100 Hct 45.3  TB Gold: 07/07/18 Neg   Okay to refill per Dr. Estanislado Pandy

## 2019-03-29 LAB — CBC WITH DIFFERENTIAL/PLATELET
Absolute Monocytes: 586 cells/uL (ref 200–950)
Basophils Absolute: 56 cells/uL (ref 0–200)
Basophils Relative: 0.6 %
Eosinophils Absolute: 223 cells/uL (ref 15–500)
Eosinophils Relative: 2.4 %
HCT: 45.3 % — ABNORMAL HIGH (ref 35.0–45.0)
Hemoglobin: 15.4 g/dL (ref 11.7–15.5)
Lymphs Abs: 3441 cells/uL (ref 850–3900)
MCH: 32 pg (ref 27.0–33.0)
MCHC: 34 g/dL (ref 32.0–36.0)
MCV: 94.2 fL (ref 80.0–100.0)
MPV: 10.6 fL (ref 7.5–12.5)
Monocytes Relative: 6.3 %
Neutro Abs: 4994 cells/uL (ref 1500–7800)
Neutrophils Relative %: 53.7 %
Platelets: 313 10*3/uL (ref 140–400)
RBC: 4.81 10*6/uL (ref 3.80–5.10)
RDW: 12.4 % (ref 11.0–15.0)
Total Lymphocyte: 37 %
WBC: 9.3 10*3/uL (ref 3.8–10.8)

## 2019-03-29 LAB — PROTEIN ELECTROPHORESIS, SERUM, WITH REFLEX
Albumin ELP: 4.2 g/dL (ref 3.8–4.8)
Alpha 1: 0.3 g/dL (ref 0.2–0.3)
Alpha 2: 0.8 g/dL (ref 0.5–0.9)
Beta 2: 0.2 g/dL (ref 0.2–0.5)
Beta Globulin: 0.4 g/dL (ref 0.4–0.6)
Gamma Globulin: 0.8 g/dL (ref 0.8–1.7)
Total Protein: 6.7 g/dL (ref 6.1–8.1)

## 2019-03-29 LAB — CK: Total CK: 31 U/L (ref 29–143)

## 2019-03-29 LAB — COMPLETE METABOLIC PANEL WITH GFR
AG Ratio: 2 (calc) (ref 1.0–2.5)
ALT: 25 U/L (ref 6–29)
AST: 18 U/L (ref 10–35)
Albumin: 4.4 g/dL (ref 3.6–5.1)
Alkaline phosphatase (APISO): 61 U/L (ref 37–153)
BUN: 19 mg/dL (ref 7–25)
CO2: 29 mmol/L (ref 20–32)
Calcium: 10.3 mg/dL (ref 8.6–10.4)
Chloride: 102 mmol/L (ref 98–110)
Creat: 0.85 mg/dL (ref 0.50–1.05)
GFR, Est African American: 89 mL/min/{1.73_m2} (ref >=60)
GFR, Est Non African American: 77 mL/min/{1.73_m2} (ref >=60)
Globulin: 2.2 g/dL (calc) (ref 1.9–3.7)
Glucose, Bld: 100 mg/dL — ABNORMAL HIGH (ref 65–99)
Potassium: 3.8 mmol/L (ref 3.5–5.3)
Sodium: 140 mmol/L (ref 135–146)
Total Bilirubin: 0.6 mg/dL (ref 0.2–1.2)
Total Protein: 6.6 g/dL (ref 6.1–8.1)

## 2019-03-29 LAB — TSH: TSH: 2.88 mIU/L

## 2019-03-29 LAB — CYCLIC CITRUL PEPTIDE ANTIBODY, IGG: Cyclic Citrullin Peptide Ab: >250 UNITS — ABNORMAL HIGH

## 2019-03-29 LAB — SEDIMENTATION RATE: Sed Rate: 11 mm/h (ref 0–30)

## 2019-03-31 ENCOUNTER — Encounter: Payer: Self-pay | Admitting: Rheumatology

## 2019-03-31 ENCOUNTER — Other Ambulatory Visit: Payer: Self-pay

## 2019-03-31 ENCOUNTER — Ambulatory Visit: Payer: BC Managed Care – PPO | Admitting: Rheumatology

## 2019-03-31 VITALS — BP 116/69 | HR 73 | Resp 14 | Ht 67.0 in | Wt 254.8 lb

## 2019-03-31 DIAGNOSIS — Z79899 Other long term (current) drug therapy: Secondary | ICD-10-CM | POA: Diagnosis not present

## 2019-03-31 DIAGNOSIS — Z1589 Genetic susceptibility to other disease: Secondary | ICD-10-CM | POA: Diagnosis not present

## 2019-03-31 DIAGNOSIS — R7303 Prediabetes: Secondary | ICD-10-CM

## 2019-03-31 DIAGNOSIS — H169 Unspecified keratitis: Secondary | ICD-10-CM

## 2019-03-31 DIAGNOSIS — M7918 Myalgia, other site: Secondary | ICD-10-CM

## 2019-03-31 DIAGNOSIS — M0579 Rheumatoid arthritis with rheumatoid factor of multiple sites without organ or systems involvement: Secondary | ICD-10-CM | POA: Diagnosis not present

## 2019-03-31 DIAGNOSIS — Z8679 Personal history of other diseases of the circulatory system: Secondary | ICD-10-CM

## 2019-03-31 DIAGNOSIS — G5761 Lesion of plantar nerve, right lower limb: Secondary | ICD-10-CM

## 2019-03-31 DIAGNOSIS — R5383 Other fatigue: Secondary | ICD-10-CM

## 2019-03-31 DIAGNOSIS — Z8639 Personal history of other endocrine, nutritional and metabolic disease: Secondary | ICD-10-CM

## 2019-03-31 MED ORDER — DULOXETINE HCL 30 MG PO CPEP: 30.0000 mg | ORAL_CAPSULE | Freq: Every day | ORAL | 2 refills | Status: DC

## 2019-03-31 NOTE — Progress Notes (Signed)
Pharmacy Note  Subjective:  Patient presents today to [facility] to see Dr. Estanislado Pandy.  Patient seen by the pharmacist for counseling on duloxetine (Cymbalta).    Objective: Vitals:   03/31/19 0920  BP: 116/69  Pulse: 73  Resp: 14    CMP     Component Value Date/Time   NA 140 03/24/2019 1128   NA 143 03/20/2018 1655   K 3.8 03/24/2019 1128   CL 102 03/24/2019 1128   CO2 29 03/24/2019 1128   GLUCOSE 100 (H) 03/24/2019 1128   BUN 19 03/24/2019 1128   BUN 21 03/20/2018 1655   CREATININE 0.85 03/24/2019 1128   CALCIUM 10.3 03/24/2019 1128   PROT 6.6 03/24/2019 1128   PROT 6.7 03/24/2019 1128   PROT 7.1 03/20/2018 1655   ALBUMIN 4.6 03/20/2018 1655   AST 18 03/24/2019 1128   ALT 25 03/24/2019 1128   ALKPHOS 72 03/20/2018 1655   BILITOT 0.6 03/24/2019 1128   BILITOT 0.3 03/20/2018 1655   GFRNONAA 77 03/24/2019 1128   GFRAA 89 03/24/2019 1128    Assessment/Plan:  Patient was prescribed duloxetine 30 mg daily.  Patient was counseled on the purpose, proper use, and adverse effects of duloxetine including nausea, constipation, dizziness, confusion, blurry vision, increased blood pressure, increased sweating, headache, and urinary retention.  Reviewed black boxed warning of increased risk of suicidal thoughts in young adults.  Provided patient with educational materials on duloxetine and answered all questions.     Mariella Saa, PharmD, [place], [place] Clinical Specialty Pharmacist [phone]  03/31/2019 10:05 AM

## 2019-03-31 NOTE — Patient Instructions (Signed)
Infliximab injection °What is this medicine? °INFLIXIMAB (in FLIX i mab) is used to treat Crohn's disease and ulcerative colitis. It is also used to treat ankylosing spondylitis, plaque psoriasis, and some forms of arthritis. °This medicine may be used for other purposes; ask your health care provider or pharmacist if you have questions. °COMMON BRAND NAME(S): INFLECTRA, Remicade, RENFLEXIS °What should I tell my health care provider before I take this medicine? °They need to know if you have any of these conditions: °· cancer °· current or past resident of [place] °· diabetes °· exposure to tuberculosis °· Guillain-Barre syndrome °· heart failure °· hepatitis or liver disease °· immune system problems °· infection °· lung or breathing disease, like COPD °· multiple sclerosis °· receiving phototherapy for the skin °· seizure disorder °· an unusual or allergic reaction to infliximab, mouse proteins, other medicines, foods, dyes, or preservatives °· pregnant or trying to get pregnant °· breast-feeding °How should I use this medicine? °This medicine is for injection into a vein. It is usually given by a health care professional in a hospital or clinic setting. °A special MedGuide will be given to you by the pharmacist with each prescription and refill. Be sure to read this information carefully each time. °Talk to your pediatrician regarding the use of this medicine in children. While this drug may be prescribed for children as young as 6 years of age for selected conditions, precautions do apply. °Overdosage: If you think you have taken too much of this medicine contact a poison control center or emergency room at once. °NOTE: This medicine is only for you. Do not share this medicine with others. °What if I miss a dose? °It is important not to miss your dose. Call your doctor or health care professional if you are unable to keep an appointment. °What may interact with this medicine? °Do not  take this medicine with any of the following medications: °· biologic medicines such as abatacept, adalimumab, anakinra, certolizumab, etanercept, golimumab, rituximab, secukinumab, tocilizumab, tofactinib, ustekinumab °· live vaccines °This list may not describe all possible interactions. Give your health care provider a list of all the medicines, herbs, non-prescription drugs, or dietary supplements you use. Also tell them if you smoke, drink alcohol, or use illegal drugs. Some items may interact with your medicine. °What should I watch for while using this medicine? °Your condition will be monitored carefully while you are receiving this medicine. Visit your doctor or health care professional for regular checks on your progress. You may need blood work done while you are taking this medicine. Before beginning therapy, your doctor may do a test to see if you have been exposed to tuberculosis. °Call your doctor or health care professional for advice if you get a fever, chills or sore throat, or other symptoms of a cold or flu. Do not treat yourself. This drug decreases your body's ability to fight infections. Try to avoid being around people who are sick. °This medicine may make the symptoms of heart failure worse in some patients. If you notice symptoms such as increased shortness of breath or swelling of the ankles or legs, contact your health care provider right away. °If you are going to have surgery or dental work, tell your health care professional or dentist that you have received this medicine. °If you take this medicine for plaque psoriasis, stay out of the sun. If you cannot avoid being in the sun, wear protective clothing and use sunscreen. Do not   use sun lamps or tanning beds/booths. °Talk to your doctor about your risk of cancer. You may be more at risk for certain types of cancers if you take this medicine. °What side effects may I notice from receiving this medicine? °Side effects that you should  report to your doctor or health care professional as soon as possible: °· allergic reactions like skin rash, itching or hives, swelling of the face, lips, or tongue °· breathing problems °· changes in vision °· chest pain °· fever or chills, usually related to the infusion °· joint pain °· pain, tingling, numbness in the hands or feet °· redness, blistering, peeling or loosening of the skin, including inside the mouth °· seizures °· signs of infection - fever or chills, cough, sore throat, flu-like symptoms, pain or difficulty passing urine °· signs and symptoms of liver injury like dark yellow or brown urine; general ill feeling; light-colored stools; loss of appetite; nausea; right upper belly pain; unusually weak or tired; yellowing of the eyes or skin °· signs and symptoms of a stroke like changes in vision; confusion; trouble speaking or understanding; severe headaches; sudden numbness or weakness of the face, arm or leg; trouble walking; dizziness; loss of balance or coordination °· swelling of the ankles, feet, or hands °· swollen lymph nodes in the neck, underarm, or groin areas °· unusual bleeding or bruising °· unusually weak or tired °Side effects that usually do not require medical attention (report to your doctor or health care professional if they continue or are bothersome): °· headache °· nausea °· stomach pain °· upset stomach °This list may not describe all possible side effects. Call your doctor for medical advice about side effects. You may report side effects to FDA at 1-800-FDA-1088. °Where should I keep my medicine? °This drug is given in a hospital or clinic and will not be stored at home. °NOTE: This sheet is a summary. It may not cover all possible information. If you have questions about this medicine, talk to your doctor, pharmacist, or health care provider. °© 2020 [employer]vier/Gold Standard (2016-06-26 13:45:32) ° °

## 2019-04-17 ENCOUNTER — Other Ambulatory Visit: Payer: Self-pay | Admitting: Family Medicine

## 2019-04-19 ENCOUNTER — Other Ambulatory Visit: Payer: Self-pay | Admitting: *Deleted

## 2019-04-19 DIAGNOSIS — I10 Essential (primary) hypertension: Secondary | ICD-10-CM

## 2019-04-19 MED ORDER — BENAZEPRIL HCL 10 MG PO TABS: 10.0000 mg | ORAL_TABLET | Freq: Every day | ORAL | 5 refills | Status: DC

## 2019-04-19 NOTE — Telephone Encounter (Signed)
Received fax from [facility]. For Benazepril 10mg  and HCTZ 25mg  for #30 pt. Last apt was 12/30/18

## 2019-04-22 ENCOUNTER — Other Ambulatory Visit: Payer: Self-pay | Admitting: *Deleted

## 2019-04-22 MED ORDER — HYDROCHLOROTHIAZIDE 25 MG PO TABS: 25.0000 mg | ORAL_TABLET | Freq: Every day | ORAL | 2 refills | Status: DC

## 2019-04-26 ENCOUNTER — Other Ambulatory Visit: Payer: Self-pay

## 2019-04-26 ENCOUNTER — Other Ambulatory Visit: Payer: BC Managed Care – PPO

## 2019-04-26 DIAGNOSIS — R7303 Prediabetes: Secondary | ICD-10-CM

## 2019-04-26 DIAGNOSIS — E782 Mixed hyperlipidemia: Secondary | ICD-10-CM

## 2019-04-27 LAB — GLUCOSE, RANDOM: Glucose: 113 mg/dL — ABNORMAL HIGH (ref 65–99)

## 2019-04-27 LAB — LIPID PANEL
Chol/HDL Ratio: 4.7 ratio — ABNORMAL HIGH (ref 0.0–4.4)
Cholesterol, Total: 208 mg/dL — ABNORMAL HIGH (ref 100–199)
HDL: 44 mg/dL (ref >39)
LDL Chol Calc (NIH): 139 mg/dL — ABNORMAL HIGH (ref 0–99)
Triglycerides: 137 mg/dL (ref 0–149)
VLDL Cholesterol Cal: 25 mg/dL (ref 5–40)

## 2019-05-05 ENCOUNTER — Other Ambulatory Visit: Payer: Self-pay | Admitting: *Deleted

## 2019-05-05 DIAGNOSIS — E7800 Pure hypercholesterolemia, unspecified: Secondary | ICD-10-CM

## 2019-05-05 DIAGNOSIS — R7301 Impaired fasting glucose: Secondary | ICD-10-CM

## 2019-06-20 ENCOUNTER — Other Ambulatory Visit: Payer: Self-pay | Admitting: Rheumatology

## 2019-06-20 DIAGNOSIS — M7918 Myalgia, other site: Secondary | ICD-10-CM

## 2019-06-21 NOTE — Telephone Encounter (Signed)
Last Visit: 03/31/2019 Next Visit: 06/30/2019  Okay to refill per Dr. Corliss Skains.

## 2019-06-28 ENCOUNTER — Telehealth: Payer: Self-pay | Admitting: Pharmacy Technician

## 2019-06-28 NOTE — Telephone Encounter (Signed)
Received notification from [facility] regarding a prior authorization for HUMIRA. Authorization has been APPROVED from 06/28/19 to 06/27/20.   Will send document to scan center.  Authorization # [id number]

## 2019-06-28 NOTE — Telephone Encounter (Signed)
Submitted a Prior Authorization request to [facility] for HUMIRA via Fax. Will update once we receive a response.  Fax# [phone]

## 2019-06-28 NOTE — Progress Notes (Signed)
Virtual Visit via Video Note  I connected with [name] on 06/30/19 at 10:45 AM EST by a video enabled telemedicine application and verified that I am speaking with the correct person using two identifiers.  Location: Patient: Home Provider: Clinic   This service was conducted via virtual visit.  Both audio and visual tools were used.  The patient was located at home. I was located in my office.  Consent was obtained prior to the virtual visit and is aware of possible charges through their insurance for this visit.  The patient is an established patient.  Dr. Estanislado Pandy, MD conducted the virtual visit and Hazel Sams, PA-C acted as scribe during the service.  Office staff helped with scheduling follow up visits after the service was conducted.   I discussed the limitations of evaluation and management by telemedicine and the availability of in person appointments. The patient expressed understanding and agreed to proceed.  CC: Medication monitoring  History of Present Illness: Jill Mathews is a 54 y.o. female with history of seropositive rheumatoid arthritis.  She is on Humira 40 mg sq injections once every 14 days (started in Feb 2020) and MTX 0.6 ml sq once weekly.  She denies any recent rheumatoid arthritis flares.  She denies any joint pain or inflammation at this time. She has been hiking and backpacking without any difficulty. She has noticed significant improvement since starting on Cymbalta 30 mg 1 capsule daily.  She denies any myofascial pain since starting on Cymbalta. She denies any fatigue or insomnia at this time.  Review of Systems  Constitutional: Negative for fever and malaise/fatigue.  HENT: Negative for ear pain.   Eyes: Negative for photophobia, pain, discharge and redness.  Respiratory: Negative for cough, shortness of breath and wheezing.   Cardiovascular: Negative for chest pain and palpitations.  Gastrointestinal: Positive for constipation. Negative for blood  in stool and diarrhea.  Genitourinary: Negative for frequency and urgency.  Musculoskeletal: Negative for back pain, joint pain, myalgias and neck pain.  Skin: Negative for rash.  Neurological: Negative for dizziness, weakness and headaches.  Endo/Heme/Allergies: Does not bruise/bleed easily.  Psychiatric/Behavioral: Negative for depression. The patient is not nervous/anxious and does not have insomnia.      Observations/Objective:  Physical Exam  Constitutional: She is oriented to person, place, and time and well-developed, well-nourished, and in no distress.  HENT:  Head: Normocephalic and atraumatic.  Eyes: Conjunctivae are normal.  Pulmonary/Chest: Effort normal.  Neurological: She is alert and oriented to person, place, and time.  Psychiatric: Mood, memory, affect and judgment normal.    Patient reports morning stiffness for 0 minutes.   Patient denies nocturnal pain.  Difficulty dressing/grooming: Denies Difficulty climbing stairs: Denies Difficulty getting out of chair: Denies Difficulty using hands for taps, buttons, cutlery, and/or writing: Denies  Assessment and Plan: Visit Diagnoses: Rheumatoid arthritis involving multiple sites with positive rheumatoid factor (HCC) - +RF,+ anti-CCP: She has not had any recent rheumatoid arthritis flares.  She has no joint pain or joint inflammation at this time. No morning stiffness or nocturnal pain.  She is clinically doing well on Humira 40 mg sq injections every 14 days. She has not missed any doses recently. She has been hiking and backpacking without any difficulty.  She has also been losing weight exercising and going to weight watchers. She states since starting on Cymbalta 30 mg 1 capsule by mouth daily she feels "15 years younger."  She will continue on Humira as prescribed.  She was advised  to notify us if she develops increased joint pain or joint swelling. She will follow up in 4-5 months.   High risk medication use - Humira  40 mg every 14 days (Started in February 2020) and methotrexate 0.6 ml every 7 days.  She had an adequate response to Enbrel in the past.Last TB gold negative on 07/07/2018 and will monitor yearly. Most recent CBC/CMP within normal limits on 03/24/2019.  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  She has no eye pain, photophobia, or eye dryness currently.   HLA B27 (HLA B27 positive)  Morton's neuroma of right foot: She has no discomfort at this time. She has been able to hike several days per week without any difficulty.   Myofascial pain-She is not experiencing any myofascial pain at this time.  She has no tender points or muscle spasms at this time.  She denies any fatigue or insomnia at this time. She has been very active hiking and backpacking several days per week.  She was started on Cymbalta 30 mg 1 capsule by mouth daily in October 2020, and she has noticed significant improvement since starting on cymbalta.  She states she feels 15 years younger.  She has been able to increase her level of activity without any difficulty.  She will continue taking cymbalta 30 mg 1 capsule daily.    Other fatigue-She is not having any fatigue at this time.  She remains very active.   Other medical conditions are listed as follows:   Prediabetes  History of hypothyroidism  History of hypertension  History of hyperlipidemia     Follow Up Instructions: She will follow up in 4-5 months.    I discussed the assessment and treatment plan with the patient. The patient was provided an opportunity to ask questions and all were answered. The patient agreed with the plan and demonstrated an understanding of the instructions.   The patient was advised to call back or seek an in-person evaluation if the symptoms worsen or if the condition fails to improve as anticipated.  I provided 15 minutes of non-face-to-face time during this  encounter.  Bo Merino, MD   Scribed byLovena Le dale PA-C

## 2019-06-30 ENCOUNTER — Encounter: Payer: Self-pay | Admitting: Rheumatology

## 2019-06-30 ENCOUNTER — Other Ambulatory Visit: Payer: Self-pay

## 2019-06-30 ENCOUNTER — Telehealth (INDEPENDENT_AMBULATORY_CARE_PROVIDER_SITE_OTHER): Payer: BC Managed Care – PPO | Admitting: Rheumatology

## 2019-06-30 DIAGNOSIS — Z79899 Other long term (current) drug therapy: Secondary | ICD-10-CM

## 2019-06-30 DIAGNOSIS — H169 Unspecified keratitis: Secondary | ICD-10-CM | POA: Diagnosis not present

## 2019-06-30 DIAGNOSIS — Z8639 Personal history of other endocrine, nutritional and metabolic disease: Secondary | ICD-10-CM

## 2019-06-30 DIAGNOSIS — G5761 Lesion of plantar nerve, right lower limb: Secondary | ICD-10-CM

## 2019-06-30 DIAGNOSIS — R7303 Prediabetes: Secondary | ICD-10-CM

## 2019-06-30 DIAGNOSIS — R5383 Other fatigue: Secondary | ICD-10-CM

## 2019-06-30 DIAGNOSIS — M0579 Rheumatoid arthritis with rheumatoid factor of multiple sites without organ or systems involvement: Secondary | ICD-10-CM

## 2019-06-30 DIAGNOSIS — M7918 Myalgia, other site: Secondary | ICD-10-CM

## 2019-06-30 DIAGNOSIS — Z1589 Genetic susceptibility to other disease: Secondary | ICD-10-CM | POA: Diagnosis not present

## 2019-06-30 DIAGNOSIS — Z8679 Personal history of other diseases of the circulatory system: Secondary | ICD-10-CM

## 2019-07-01 ENCOUNTER — Other Ambulatory Visit: Payer: Self-pay

## 2019-07-01 DIAGNOSIS — Z79899 Other long term (current) drug therapy: Secondary | ICD-10-CM

## 2019-07-02 NOTE — Progress Notes (Signed)
CBC and CMP WNL

## 2019-07-03 LAB — CBC WITH DIFFERENTIAL/PLATELET
Absolute Monocytes: 636 cells/uL (ref 200–950)
Basophils Absolute: 34 cells/uL (ref 0–200)
Basophils Relative: 0.4 %
Eosinophils Absolute: 198 cells/uL (ref 15–500)
Eosinophils Relative: 2.3 %
HCT: 44.2 % (ref 35.0–45.0)
Hemoglobin: 15.1 g/dL (ref 11.7–15.5)
Lymphs Abs: 3105 cells/uL (ref 850–3900)
MCH: 32 pg (ref 27.0–33.0)
MCHC: 34.2 g/dL (ref 32.0–36.0)
MCV: 93.6 fL (ref 80.0–100.0)
MPV: 10.7 fL (ref 7.5–12.5)
Monocytes Relative: 7.4 %
Neutro Abs: 4627 cells/uL (ref 1500–7800)
Neutrophils Relative %: 53.8 %
Platelets: 284 10*3/uL (ref 140–400)
RBC: 4.72 10*6/uL (ref 3.80–5.10)
RDW: 13.1 % (ref 11.0–15.0)
Total Lymphocyte: 36.1 %
WBC: 8.6 10*3/uL (ref 3.8–10.8)

## 2019-07-03 LAB — COMPLETE METABOLIC PANEL WITH GFR
AG Ratio: 2.1 (calc) (ref 1.0–2.5)
ALT: 18 U/L (ref 6–29)
AST: 14 U/L (ref 10–35)
Albumin: 4.4 g/dL (ref 3.6–5.1)
Alkaline phosphatase (APISO): 61 U/L (ref 37–153)
BUN: 14 mg/dL (ref 7–25)
CO2: 30 mmol/L (ref 20–32)
Calcium: 10 mg/dL (ref 8.6–10.4)
Chloride: 103 mmol/L (ref 98–110)
Creat: 0.87 mg/dL (ref 0.50–1.05)
GFR, Est African American: 87 mL/min/{1.73_m2} (ref >=60)
GFR, Est Non African American: 75 mL/min/{1.73_m2} (ref >=60)
Globulin: 2.1 g/dL (calc) (ref 1.9–3.7)
Glucose, Bld: 94 mg/dL (ref 65–99)
Potassium: 4.3 mmol/L (ref 3.5–5.3)
Sodium: 140 mmol/L (ref 135–146)
Total Bilirubin: 0.4 mg/dL (ref 0.2–1.2)
Total Protein: 6.5 g/dL (ref 6.1–8.1)

## 2019-07-03 LAB — QUANTIFERON-TB GOLD PLUS
Mitogen-NIL: >10 IU/mL
NIL: 0.03 IU/mL
QuantiFERON-TB Gold Plus: NEGATIVE
TB1-NIL: 0.01 IU/mL
TB2-NIL: 0.01 IU/mL

## 2019-07-05 NOTE — Progress Notes (Signed)
TB gold negative

## 2019-07-08 ENCOUNTER — Telehealth: Payer: Self-pay | Admitting: Rheumatology

## 2019-07-08 NOTE — Telephone Encounter (Signed)
I LMOM for patient to call, and schedule a follow up appointment for 5 months (around 12/05/19). 

## 2019-07-08 NOTE — Telephone Encounter (Signed)
-----   Message from Audrie Lia, RT sent at 06/30/2019  1:55 PM EST ----- Regarding: 4-5 MONTH F/U

## 2019-08-05 ENCOUNTER — Other Ambulatory Visit: Payer: Self-pay

## 2019-08-24 ENCOUNTER — Other Ambulatory Visit: Payer: Self-pay | Admitting: Rheumatology

## 2019-08-24 NOTE — Telephone Encounter (Signed)
Last Visit: 06/30/19 Next Visit: 01/06/20 Labs: 07/01/19 WNL  Okay to refill per Dr. Corliss Skains

## 2019-09-21 ENCOUNTER — Other Ambulatory Visit: Payer: Self-pay | Admitting: *Deleted

## 2019-09-21 DIAGNOSIS — M7918 Myalgia, other site: Secondary | ICD-10-CM

## 2019-09-21 MED ORDER — DULOXETINE HCL 30 MG PO CPEP: ORAL_CAPSULE | ORAL | 2 refills | Status: DC

## 2019-09-21 NOTE — Telephone Encounter (Signed)
Refill request received via fax  Last Visit: 06/30/19 Next Visit: 01/06/20  Okay to refill per Dr. Corliss Skains

## 2019-10-13 ENCOUNTER — Other Ambulatory Visit: Payer: Self-pay | Admitting: Rheumatology

## 2019-10-13 DIAGNOSIS — M0579 Rheumatoid arthritis with rheumatoid factor of multiple sites without organ or systems involvement: Secondary | ICD-10-CM

## 2019-10-13 NOTE — Telephone Encounter (Signed)
Last Visit: 06/30/19 Next Visit: 01/06/20 Labs: 07/01/19 WNL TB Gold: 07/01/19 Neg   Current Dose per office note on 06/30/2019: Humira 40 mg sq injections once every 14 days   Okay to refill per Dr. Corliss Skains

## 2019-10-17 ENCOUNTER — Other Ambulatory Visit: Payer: Self-pay | Admitting: Family Medicine

## 2019-10-17 DIAGNOSIS — I10 Essential (primary) hypertension: Secondary | ICD-10-CM

## 2019-10-18 NOTE — Telephone Encounter (Signed)
Pt has an appt in july 

## 2019-10-25 ENCOUNTER — Other Ambulatory Visit: Payer: Self-pay | Admitting: Family Medicine

## 2019-10-25 NOTE — Telephone Encounter (Signed)
She had a normal TSH in October.  She needs to schedule fasting labs (NOT including TSH, unless she has symptoms or wants it checked, then go ahead and enter an order for that as well).  If she elects to hold off until her July physical for labs, be sure she comes fasting (or schedule a fasting lab visit prior and send me back so I can add in additional labs).  See if she is still doing well as she was the last time (losing weight, exercising).

## 2019-10-25 NOTE — Telephone Encounter (Signed)
Is this okay to refill? Looks like she didn't come for labs in Dec but wasn't for TSH. Has appt in July.

## 2019-10-25 NOTE — Telephone Encounter (Signed)
Left message asking patient to call me back to note which she prefers to do.

## 2019-11-30 ENCOUNTER — Encounter: Payer: Self-pay | Admitting: Family Medicine

## 2019-12-16 ENCOUNTER — Telehealth: Payer: Self-pay | Admitting: *Deleted

## 2019-12-16 DIAGNOSIS — M7918 Myalgia, other site: Secondary | ICD-10-CM

## 2019-12-16 MED ORDER — DULOXETINE HCL 30 MG PO CPEP: ORAL_CAPSULE | ORAL | 2 refills | Status: DC

## 2019-12-16 NOTE — Telephone Encounter (Signed)
Please schedule patient for a follow up visit. Patient was due May 2021. Thanks! 

## 2019-12-16 NOTE — Telephone Encounter (Signed)
Last Visit: 06/30/2019 Next Visit was due May 2021. Message sent to the front to schedule patient   Okay to refill per Dr. Corliss Skains

## 2019-12-21 NOTE — Progress Notes (Signed)
Office Visit Note  [name]             Date of Birth: 1965-09-16           MRN: [id number]             PCP: Rita Ohara, MD Referring: Rita Ohara, MD Visit Date: 01/03/2020 Occupation: _0 @  Subjective:  Medication monitoring   History of Present Illness: Jill Mathews is a 54 y.o. female with history of seropositive rheumatoid arthritis.  Patient is on Humira 40 mg subcutaneous injections every 14 days and methotrexate 0.6 ml subcutaneous injections once weekly. She has not been taking folic acid recently, and she has started to notice intermittent oral ulcerations.  She denies any signs or symptoms of any rheumatoid flares flares.  She denies any joint pain or joint swelling at this time.  She has no morning stiffness or nocturnal pain.  She remains very active and hikes and fishes on a regular basis.  She has been walking 3 miles daily with a 21 pound backpack in preparation for an upcoming hike.  Patient reports that she has been going to weight watchers and has lost over 33 pounds since starting.  She states that her energy level has improved and she is sleeping well at night.  She states that Cymbalta 30 mg 1 capsule daily has been very effective in helping with her pain and mood.  She feels as though she is" 54 years old again."    Activities of Daily Living:  Patient reports morning stiffness for 0 minutes.   Patient Denies nocturnal pain.  Difficulty dressing/grooming: Denies Difficulty climbing stairs: Denies Difficulty getting out of chair: Denies Difficulty using hands for taps, buttons, cutlery, and/or writing: Denies  Review of Systems  Constitutional: Negative for fatigue.  HENT: Positive for mouth sores. Negative for mouth dryness and nose dryness.   Eyes: Negative for pain, visual disturbance and dryness.  Respiratory: Negative for cough, hemoptysis, shortness of breath and difficulty breathing.   Cardiovascular: Negative for chest pain,  palpitations, hypertension and swelling in legs/feet.  Gastrointestinal: Negative for blood in stool, constipation and diarrhea.  Endocrine: Negative for increased urination.  Genitourinary: Negative for painful urination.  Musculoskeletal: Negative for arthralgias, joint pain, joint swelling, myalgias, muscle weakness, morning stiffness, muscle tenderness and myalgias.  Skin: Negative for color change, pallor, rash, hair loss, nodules/bumps, skin tightness, ulcers and sensitivity to sunlight.  Allergic/Immunologic: Negative for susceptible to infections.  Neurological: Negative for dizziness, numbness, headaches and weakness.  Hematological: Negative for swollen glands.  Psychiatric/Behavioral: Negative for depressed mood and sleep disturbance. The patient is not nervous/anxious.     PMFS History:  Patient Active Problem List   Diagnosis Date Noted  . HLA B27 (HLA B27 positive) 10/24/2016  . Prediabetes 10/31/2014  . Mixed hyperlipidemia 09/09/2012  . Hypothyroidism 05/25/2012  . Essential hypertension, benign 05/25/2012  . Rheumatoid arthritis involving multiple sites with positive rheumatoid factor ([place]) 05/25/2012  . Obesity (BMI 30-39.9) 05/25/2012    Past Medical History:  Diagnosis Date  . Hyperlipidemia    borderline; dietary management  . Hypertension   . Hypothyroidism 2001  . Rheumatoid arthritis(714.0) 04/2006   Dr. Estanislado Pandy manages    Family History  Problem Relation Age of Onset  . Heart disease Mother 56       MI, stents x 2; CABG age 30  . Stroke Mother 26  . Hyperlipidemia Mother   . Hypertension Mother   . Depression Mother   .  Diabetes Mother   . Thyroid disease Mother        Graves disease, s/p RAI, now hypothyroid  . Irritable bowel syndrome Mother   . COPD Mother   . Colon polyps Mother   . Cancer Mother        skin (not melanoma)  . Kidney Stones Mother   . Stroke Father 39  . Hyperlipidemia Father   . Hypertension Father   . Depression  Father   . Diabetes Father   . Thyroid disease Father        hypothyroidism  . COPD Father   . Heart disease Father 41       MI; s/p CABG; pacemaker  . Cancer Sister 31       pancreatic cancer  . Bipolar disorder Sister   . Lupus Paternal Aunt   . Heart disease Maternal Uncle        s/p CABG, late 38's  . Cancer Maternal Grandfather        laryngeal, smoker  . Multiple sclerosis Cousin    Past Surgical History:  Procedure Laterality Date  . 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 PILONIDAL CYST EXCISION  1983  . TONSILLECTOMY  age 14  . WISDOM TOOTH EXTRACTION     Social History   Social History Narrative   Lives with female partner of 46 years--got married 06/11/13. 3 cats, 1 dog      (had to slaughter chicken in order to isolate dog after incident with rabid fox getting into yard; one dog wasn't vaccinated--had to put down)      Teaches biology at [employer] History  Administered Date(s) Administered  . Influenza Split 03/10/2012  . Influenza,inj,Quad PF,6+ Mos 04/11/2019  . Pneumococcal Conjugate-13 11/01/2015  . Pneumococcal Polysaccharide-23 07/02/2006, 12/30/2018  . Tdap 09/09/2012     Objective: Vital Signs: BP (!) 134/91 (BP Location: Left Arm, Patient Position: Sitting, Cuff Size: Normal)   Pulse 65   Resp 16   Ht _0  (1.702 m)   Wt (!) 222 lb 9.6 oz (101 kg)   LMP 01/13/2013   BMI 34.86 kg/m    Physical Exam Vitals and nursing note reviewed.  Constitutional:      Appearance: She is well-developed.  HENT:     Head: Normocephalic and atraumatic.  Eyes:     Conjunctiva/sclera: Conjunctivae normal.  Pulmonary:     Effort: Pulmonary effort is normal.  Abdominal:     General: Bowel sounds are normal.     Palpations: Abdomen is soft.  Musculoskeletal:     Cervical back: Normal range of motion.  Lymphadenopathy:     Cervical: No cervical adenopathy.  Skin:    General: Skin is warm and dry.      Capillary Refill: Capillary refill takes less than 2 seconds.  Neurological:     Mental Status: She is alert and oriented to person, place, and time.  Psychiatric:        Behavior: Behavior normal.      Musculoskeletal Exam: C-spine, thoracic spine, lumbar spine good range of motion.  Shoulder joints, elbow joints, wrist joints, MCPs, PIPs, DIPs good range of motion with no synovitis.  She has complete fist formation bilaterally.  Hip joints, knee joints, ankle joints, MTPs, PIPs, DIPs have good range of motion with no synovitis.  No warmth or effusion of bilateral knee joints.  No tenderness or inflammation of ankle joints.  No tenderness of  MTP joints.  CDAI Exam: CDAI Score: 0  Patient Global: 0 mm; Provider Global: 0 mm Swollen: 0 ; Tender: 0  Joint Exam 01/03/2020   No joint exam has been documented for this visit   There is currently no information documented on the homunculus. Go to the Rheumatology activity and complete the homunculus joint exam.  Investigation: No additional findings.  Imaging: No results found.  Recent Labs: Lab Results  Component Value Date   WBC 6.1 12/29/2019   HGB 15.0 12/29/2019   PLT 308 12/29/2019   NA 142 12/29/2019   K 4.2 12/29/2019   CL 102 12/29/2019   CO2 26 12/29/2019   GLUCOSE 108 (H) 12/29/2019   BUN 17 12/29/2019   CREATININE 0.89 12/29/2019   BILITOT 0.6 12/29/2019   ALKPHOS 70 12/29/2019   AST 19 12/29/2019   ALT 26 12/29/2019   PROT 6.4 12/29/2019   ALBUMIN 4.2 12/29/2019   CALCIUM 9.5 12/29/2019   GFRAA 84 12/29/2019   QFTBGOLD NEGATIVE 05/05/2017   QFTBGOLDPLUS NEGATIVE 07/01/2019    Speciality Comments: Prior therapy includes: Enbrel (inadequate response), Plaquenil (inadequate response)   Procedures:  No procedures performed Allergies: Penicillins   Assessment / Plan:     Visit Diagnoses: Rheumatoid arthritis involving multiple sites with positive rheumatoid factor (HCC) - +RF,+ anti-CCP: She has no joint  tenderness or synovitis on exam.  She has not had any recent rheumatoid arthritis flares.  X-rays of both hands and feet were obtained today to assess for radiographic progression.  She is clinically doing well on Humira 40 mg subcutaneous injections every 14 days and methotrexate 0.6 mL sq injections once weekly.  She was encouraged to restart folic acid 1 mg 2 tablets by mouth daily since she has noticed an increase in oral ulcerations.  Her myalgias and arthralgias have improved significantly since adding on Cymbalta.  She is taking Cymbalta 30 mg 1 capsule by mouth daily which has improved her fatigue, insomnia, and overall discomfort.  She is not experiencing any joint pain, morning stiffness, or nocturnal pain. She has been walking 3 miles daily with a 21 pound backpack in preparation for an upcoming hike.  She is also started going to weight watchers and has lost over 30 pounds since starting.  She will continue on Humira and methotrexate as prescribed.  She is advised to notify us if she develops increased joint pain or joint swelling.  She will follow-up in the office in 5 months.  High risk medication use - Humira 40 mg sq injections every 14 days (Started in February 2020) and methotrexate 0.6 ml every 7 days.  She was advised to restart folic acid 1 mg 2 tablets by mouth daily.  TB gold negative 07/01/2019.  CBC within normal limits.  Glucose 108 rest of CMP within normal limits.  She will be due to update lab work in October and every 3 months to monitor for drug toxicity. She has received both COVID-19 vaccinations.  We discussed the importance of wearing a mask and social distancing.  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  The mask mandate is not required for students at this time.  We encouraged her to try to work from home.  A work note was provided to the patient today.   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   HLA B27 (HLA  B27 positive)  Morton's neuroma of right foot: She has no discomfort at this time.  She wears proper fitting shoes.   Myofascial pain - She started on Cymbalta 30 mg 1 capsule by mouth daily in October 2020, and she has noticed significant improvement since starting on cymbalta.  Her level of fatigue has improved significantly.  She has been sleeping well at night.  She is not experiencing any myalgias or arthralgias at this time.  She will continue taking Cymbalta as prescribed.  She does not need any refills at this time.  Other medical conditions are listed as follows:   History of hypothyroidism  Prediabetes  History of hyperlipidemia  History of hypertension  Other fatigue  Orders: Orders Placed This Encounter  Procedures  . XR Hand 2 View Right  . XR Hand 2 View Left  . XR Foot 2 Views Right  . XR Foot 2 Views Left   No orders of the defined types were placed in this encounter.     Follow-Up Instructions: Return in about 5 months (around 06/04/2020) for Rheumatoid arthritis.   Ofilia Neas, PA-C   I examined and evaluated the patient with Jill Sams PA.  I discussed x-ray findings with the patient.  There is no radiographic progression when compared to the x-rays of 2019.  She had erosive change in her bilateral fifth MTP joints.  Patient has received COVID-19 vaccine.  I detailed discussion regarding continued precautions including wearing mask, social distancing and hand hygiene.  She also required a letter for her work which was written today.  We recommend that she should work virtually or should have mask requirement while she is working.  The plan of care was discussed as noted above.  Bo Merino, MD  Note - This record has been created using Editor, commissioning.  Chart creation errors have been sought, but may not always  have been located. Such creation errors do not reflect on   the standard of medical care.

## 2019-12-27 ENCOUNTER — Telehealth: Payer: Self-pay | Admitting: Internal Medicine

## 2019-12-27 ENCOUNTER — Telehealth: Payer: Self-pay | Admitting: Rheumatology

## 2019-12-27 ENCOUNTER — Other Ambulatory Visit: Payer: Self-pay | Admitting: *Deleted

## 2019-12-27 DIAGNOSIS — Z5181 Encounter for therapeutic drug level monitoring: Secondary | ICD-10-CM

## 2019-12-27 DIAGNOSIS — Z0000 Encounter for general adult medical examination without abnormal findings: Secondary | ICD-10-CM

## 2019-12-27 NOTE — Telephone Encounter (Signed)
Patient advised she is due to update CBC and CMP.   Patient advised she will have to have PCP order the covid antibody testing. Patient advised it is suggested for her to have labs drawn with PCP.  Patient advised we recommend continuing to wear a mask and following the standard precautions.

## 2019-12-27 NOTE — Telephone Encounter (Signed)
Pt states that she has an appt her on 7/29 for her cpe but she has rheumatology appt that Tuesday and she wants to know if you can order the blood work she get it drawn at the rhematologist instead of getting prickled twice

## 2019-12-27 NOTE — Telephone Encounter (Signed)
Patient states she would like to know if it is suggested that she get a Covid antibody test as she has had both of her vaccines but she is on MTX. Patient advised that it would need to be ordered by her PCP. Patient states she has been in contact with her PCP so they can advise [place] on what labs they would like to have drawn so she may have all the blood work done at one time here in our office. Please advise regarding the Covid antibody test and if we can add additional labs to her standing orders.

## 2019-12-27 NOTE — Telephone Encounter (Signed)
Spoke with Dr.Knapp again and patient actually will only need CBC and CMP for rheum-others tests were already done in Oct.  I ordered CMET and CBC to have done with the lipid and she canceled the glu. Left message letting patient know that she can call and scheduled fasting lab appt-minus the COVID antibody screening and the reason why per Dr.Knapp.

## 2019-12-27 NOTE — Telephone Encounter (Signed)
The future orders in system from me are lipids and glu. These were ordered in 04/2019 to be done in 3 months (but weren't). Now, I actually recommend c-met, CBC and lipids. So, need to cancel the glucose, and add order for c-met and CBC. The orders from the rheumatologist--I don't think we can release orders from other providers, I think they need to be re-entered, but using the diagnosis codes entered by the specialist.  (If I'm wrong about that, then just release the orders). Orders from rheum were placed 03/2019, and are for spep, cyclic citrul peptide Ab, TSH, CK and sed rate.    Dx codes for my labs (the c-met and CBC that need to be added) are annual physical, med monitor.  We do not order antibodies to evaluate for immunity from the vaccines.  They are not recommending boosters for anybody yet, just letting people know that they efficacy of the vaccine may be less than that stated for those not on medications that affect the immune system.  It is possible this could change, but NOT being recommended at this time.  Please advise pt and enter the orders (c-met and CBC from me, delete the glu future order, keeping the lipids, and re-entering those from rheum that were entered 03/2019, if they need to be).  Thanks

## 2019-12-27 NOTE — Telephone Encounter (Signed)
Pt call back again. She has three issues.  She would liked to come in this Wednesday to have labs drawn for her appt with you next week. She also would like her labs from her rheumatologist. She states per that facility lab orders are in epic. She also states that she hears that one of her meds, methotrexate, makes COVID vaccine less effective. She is requesting antibodies be added to orders to see if she needs any additional shots. Please advise pt at [phone].

## 2019-12-27 NOTE — Telephone Encounter (Signed)
She is due to update CBC and CMP.   She will have to have PCP order the covid antibody testing.  We recommend continuing to wear a mask and following the standard precautions.

## 2019-12-27 NOTE — Telephone Encounter (Signed)
Patient is due for labs, and wants to know if #1. Dr. Corliss Skains can add a COVID antibodies test to order? Patient concerned about Delta variant and MTX.  #2.Patient also wants to have labs drawn at PCP's office. Please call to discuss.

## 2019-12-29 ENCOUNTER — Other Ambulatory Visit: Payer: Self-pay

## 2019-12-29 ENCOUNTER — Other Ambulatory Visit: Payer: BC Managed Care – PPO

## 2019-12-29 DIAGNOSIS — E7800 Pure hypercholesterolemia, unspecified: Secondary | ICD-10-CM

## 2019-12-29 DIAGNOSIS — Z5181 Encounter for therapeutic drug level monitoring: Secondary | ICD-10-CM

## 2019-12-29 DIAGNOSIS — Z0000 Encounter for general adult medical examination without abnormal findings: Secondary | ICD-10-CM

## 2019-12-30 LAB — COMPREHENSIVE METABOLIC PANEL
ALT: 26 IU/L (ref 0–32)
AST: 19 IU/L (ref 0–40)
Albumin/Globulin Ratio: 1.9 (ref 1.2–2.2)
Albumin: 4.2 g/dL (ref 3.8–4.9)
Alkaline Phosphatase: 70 IU/L (ref 48–121)
BUN/Creatinine Ratio: 19 (ref 9–23)
BUN: 17 mg/dL (ref 6–24)
Bilirubin Total: 0.6 mg/dL (ref 0.0–1.2)
CO2: 26 mmol/L (ref 20–29)
Calcium: 9.5 mg/dL (ref 8.7–10.2)
Chloride: 102 mmol/L (ref 96–106)
Creatinine, Ser: 0.89 mg/dL (ref 0.57–1.00)
GFR calc Af Amer: 84 mL/min/{1.73_m2} (ref >59)
GFR calc non Af Amer: 73 mL/min/{1.73_m2} (ref >59)
Globulin, Total: 2.2 g/dL (ref 1.5–4.5)
Glucose: 108 mg/dL — ABNORMAL HIGH (ref 65–99)
Potassium: 4.2 mmol/L (ref 3.5–5.2)
Sodium: 142 mmol/L (ref 134–144)
Total Protein: 6.4 g/dL (ref 6.0–8.5)

## 2019-12-30 LAB — CBC WITH DIFFERENTIAL/PLATELET
Basophils Absolute: 0 10*3/uL (ref 0.0–0.2)
Basos: 1 %
EOS (ABSOLUTE): 0.2 10*3/uL (ref 0.0–0.4)
Eos: 3 %
Hematocrit: 45.8 % (ref 34.0–46.6)
Hemoglobin: 15 g/dL (ref 11.1–15.9)
Immature Grans (Abs): 0 10*3/uL (ref 0.0–0.1)
Immature Granulocytes: 0 %
Lymphocytes Absolute: 2.8 10*3/uL (ref 0.7–3.1)
Lymphs: 45 %
MCH: 31.6 pg (ref 26.6–33.0)
MCHC: 32.8 g/dL (ref 31.5–35.7)
MCV: 97 fL (ref 79–97)
Monocytes Absolute: 0.5 10*3/uL (ref 0.1–0.9)
Monocytes: 8 %
Neutrophils Absolute: 2.7 10*3/uL (ref 1.4–7.0)
Neutrophils: 43 %
Platelets: 308 10*3/uL (ref 150–450)
RBC: 4.74 x10E6/uL (ref 3.77–5.28)
RDW: 13.4 % (ref 11.7–15.4)
WBC: 6.1 10*3/uL (ref 3.4–10.8)

## 2019-12-30 LAB — LIPID PANEL
Chol/HDL Ratio: 4.6 ratio — ABNORMAL HIGH (ref 0.0–4.4)
Cholesterol, Total: 237 mg/dL — ABNORMAL HIGH (ref 100–199)
HDL: 51 mg/dL (ref >39)
LDL Chol Calc (NIH): 156 mg/dL — ABNORMAL HIGH (ref 0–99)
Triglycerides: 163 mg/dL — ABNORMAL HIGH (ref 0–149)
VLDL Cholesterol Cal: 30 mg/dL (ref 5–40)

## 2020-01-03 ENCOUNTER — Other Ambulatory Visit: Payer: Self-pay

## 2020-01-03 ENCOUNTER — Encounter: Payer: Self-pay | Admitting: Physician Assistant

## 2020-01-03 ENCOUNTER — Ambulatory Visit: Payer: Self-pay

## 2020-01-03 ENCOUNTER — Ambulatory Visit: Payer: BC Managed Care – PPO | Admitting: Physician Assistant

## 2020-01-03 ENCOUNTER — Telehealth: Payer: Self-pay | Admitting: *Deleted

## 2020-01-03 ENCOUNTER — Encounter: Payer: Self-pay | Admitting: *Deleted

## 2020-01-03 VITALS — BP 134/91 | HR 65 | Resp 16 | Ht 67.0 in | Wt 222.6 lb

## 2020-01-03 DIAGNOSIS — Z79899 Other long term (current) drug therapy: Secondary | ICD-10-CM

## 2020-01-03 DIAGNOSIS — M0579 Rheumatoid arthritis with rheumatoid factor of multiple sites without organ or systems involvement: Secondary | ICD-10-CM

## 2020-01-03 DIAGNOSIS — R7303 Prediabetes: Secondary | ICD-10-CM

## 2020-01-03 DIAGNOSIS — G5761 Lesion of plantar nerve, right lower limb: Secondary | ICD-10-CM

## 2020-01-03 DIAGNOSIS — Z8679 Personal history of other diseases of the circulatory system: Secondary | ICD-10-CM

## 2020-01-03 DIAGNOSIS — R5383 Other fatigue: Secondary | ICD-10-CM

## 2020-01-03 DIAGNOSIS — Z1589 Genetic susceptibility to other disease: Secondary | ICD-10-CM

## 2020-01-03 DIAGNOSIS — H169 Unspecified keratitis: Secondary | ICD-10-CM

## 2020-01-03 DIAGNOSIS — Z8639 Personal history of other endocrine, nutritional and metabolic disease: Secondary | ICD-10-CM

## 2020-01-03 DIAGNOSIS — M7918 Myalgia, other site: Secondary | ICD-10-CM

## 2020-01-03 MED ORDER — FOLIC ACID 1 MG PO TABS
2.0000 mg | ORAL_TABLET | Freq: Every day | ORAL | 2 refills | Status: DC
Start: 2020-01-03 — End: 2021-01-25

## 2020-01-03 NOTE — Patient Instructions (Signed)
Standing Labs We placed an order today for your standing lab work.   Please have your standing labs drawn in October and every 3 months.  If possible, please have your labs drawn 2 weeks prior to your appointment so that the provider can discuss your results at your appointment.  We have open lab daily Monday through Thursday from 8:30-12:30 PM and 1:30-4:30 PM and Friday from 8:30-12:30 PM and 1:30-4:00 PM at the office of Dr. Pollyann Savoy, [facility].   Please be advised, patients with office appointments requiring lab work will take precedents over walk-in lab work.  If possible, please come for your lab work on Monday and Friday afternoons, as you may experience shorter wait times. The office is located at [address], [address], [place], [place] [address] No appointment is necessary.   Labs are drawn by [facility]. Please bring your co-pay at the time of your lab draw.  You may receive a bill from [facility] for your lab work.  If you wish to have your labs drawn at another location, please call the office 24 hours in advance to send orders.  If you have any questions regarding directions or hours of operation,  please call [phone].   As a reminder, please drink plenty of water prior to coming for your lab work. Thanks!   Vaccines You are taking a medication(s) that can suppress your immune system.  The following immunizations are recommended: . Flu annually . Covid-19  o Please continue to wear your mask, practice social distancing and good hand hygiene if you are on any medications that suppress your immune system, even if you are fully immunized. o If you are on methotrexate, Cellcept (mycophenolate), Rinvoq, Harriette Ohara, and Olumiant - Hold medication for 1 week after each vaccine dose o If you are on Orencia Subcutaneous injections - Hold medication both one week prior to and one week after the first vaccine dose (only) o If you are on Orencia IV  infusions - Time vaccine administration so that the first vaccination will occur four weeks after infusion and delay the next infusion by 1 week. . Pneumonia (Pneumovax 23 and Prevnar 13 spaced at least 1 year apart) . Shingrix (after age 25)  Please check with your PCP to make sure you are up to date.

## 2020-01-03 NOTE — Telephone Encounter (Signed)
Error

## 2020-01-05 NOTE — Patient Instructions (Addendum)
  HEALTH MAINTENANCE RECOMMENDATIONS:  It is recommended that you get at least 30 minutes of aerobic exercise at least 5 days/week (for weight loss, you may need as much as 60-90 minutes). This can be any activity that gets your heart rate up. This can be divided in 10-15 minute intervals if needed, but try and build up your endurance at least once a week.  Weight bearing exercise is also recommended twice weekly.  Eat a healthy diet with lots of vegetables, fruits and fiber.  "Colorful" foods have a lot of vitamins (ie green vegetables, tomatoes, red peppers, etc).  Limit sweet tea, regular sodas and alcoholic beverages, all of which has a lot of calories and sugar.  Up to 1 alcoholic drink daily may be beneficial for women (unless trying to lose weight, watch sugars).  Drink a lot of water.  Calcium recommendations are 1200-1500 mg daily (1500 mg for postmenopausal women or women without ovaries), and vitamin D 1000 IU daily.  This should be obtained from diet and/or supplements (vitamins), and calcium should not be taken all at once, but in divided doses.  Monthly self breast exams and yearly mammograms for women over the age of 57 is recommended.  Sunscreen of at least SPF 30 should be used on all sun-exposed parts of the skin when outside between the hours of 10 am and 4 pm (not just when at beach or pool, but even with exercise, golf, tennis, and yard work!)  Use a sunscreen that says "broad spectrum" so it covers both UVA and UVB rays, and make sure to reapply every 1-2 hours.  Remember to change the batteries in your smoke detectors when changing your clock times in the spring and fall.  Use your seat belt every time you are in a car, and please drive safely and not be distracted with cell phones and texting while driving.  I recommend getting the new shingles vaccine (Shingrix). You will need to check with your insurance to see if it is covered, and if covered, schedule a nurse visit at our  office when convenient (given the potential side effects we discussed).  It is a series of 2 injections, spaced 2 months apart.  You are past due for colon cancer screening.  Options discussed include colonoscopy and Cologuard testing.  Please call or send [place] a message when you have decided and are ready to proceed.  We discussed bone density tests to evaluate for thinning of the bones, given your history of prednisone use. I put an order in the system so that you can call the [facility] to schedule it (and/or your mammogram, if desired--no order is needed for the mammogram) at your convenience.  Try taking Excedrin Migraine (or ibuprofen and caffeine) when you have the aura, prior to getting the migraine to see if you can prevent it.  Try and avoid triggers, stay well hydrated. If having frequent headaches, come back to discuss treatment options.  Cut back on your red meat, eat more chicken and fish, and that will help get your LDL down. Also cutting back on the half and half.

## 2020-01-05 NOTE — Progress Notes (Signed)
Chief Complaint  Patient presents with  . Annual Exam    nonfasting annual exam, does not want to do pelvic exam today. Does not have any concerns.     Jill Mathews is a 54 y.o. female who presents for a complete physical.  She had labs drawn prior to visit, see below.  She feels "fantastic". She is sleeping very well, moods are much better, pain is gone. She apologized for prior behavior, recognizing that other doctors always made her "feel better"--treating her pain, infections, etc, unlike wellness visits.  She has had some stressors--mother with cognitive decline, fell, found lung nodules, which were found to have grown, now needing PET scan.  She is using paid leave. She is worried about her job--she was told by rheum to work remotely, given the decreased efficacy of vaccines due to her immunosuppressant medications. 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 Despite these stressors, since being put on Cymbalta 30mg  in 03/2019, her moods remain good, and she is sleeping well.  Pain is significantly improved, and she feels 20 years younger since starting this medication.  Hypertension follow-up: Blood pressures at home are running mid 120's or lower, over/mid to low 70's.  BP 134/91 at rheumatologist earlier this week--reports she was agitated regarding virtual teaching. BP Readings from Last 3 Encounters:  01/03/20 (!) 134/91  03/31/19 116/69  03/24/19 119/78   She reports compliance with benazepril and HCTZ.  Denies dizziness, chest pain, edema. Denies side effects of medications, muscle cramps, cough.   Migraines--used to be just ophthalmic, but has started getting a headache (with aura).  Lays down and feels better.  Triggers seem to be dehydration and heat. Hasn't required medications.  Hyperlipidemia:h/o borderline cholesterol, which improved with dietary changes.Last check in 03/2018 was much higher than prior check. Last year we  reviewed diet in detail, and asked her to return for fasting lipids (and glu) recheck in 3-4 months.  At that time, she reported eating more burgers and ground beef (80/20), an egg daily (in casserole), butter with sweet potatoes.  She was eating lots of fruit. Lipids had been lower in 2018 when eating red meat just 1-2x/wk, eating more salads (vinaigrette dressing, no feta, with salmon or tuna), had 1 egg plus 2 egg whites daily. Previously had tried Keto diet, but caused bad flare of RA.  She returned for repeat labs in 04/2019, and there was some improvement, still above goal. She declined statin and referral to dietician, and was supposed to return in 07/2019 for repeat lipids and fasting glucose (was also elevated, see below).  She did not return for these labs, but did have them done prior to today's visit.  It is cherry season, eating a LOT, large portions. She went back to drinking coffee--5 cups/day with half-and-half. Doesn't get hungry until the afternoon, so no longer eats eggs. Red meat 4-5x/week   Ref Range & Units 8 mo ago 1 yr ago  Cholesterol, Total 100 - 199 mg/dL [id number]  [id number]   Triglycerides 0 - 149 mg/dL 742  [id number]   HDL >56 mg/dL 44  46   VLDL Cholesterol Cal 5 - 40 mg/dL 25  34   LDL Chol Calc (NIH) 0 - 99 mg/dL [id number]    Chol/HDL Ratio 0.0 - 4.4 ratio 8.8CZYS  4.8High   Hypothyroidism: Compliant with taking Synthroid 137 mcg daily, on an empty stomach, separate from her vitamins. No hair/skin/bowel changes. +intentional weight loss. Energy  is good.  H/o pre-diabetes. She had elevated A1c in 2016 of 6.1.Last check in 03/2018 was 5.9%.  She limits carbs, mostly eating meat, vegetables and fruit.  Fasting sugar was 113 in 04/2019.  RA: Under the care of Dr. Corliss Skains.  She was last by PA earlier this week. She is doing well on Humira and methotrexate.  Hadn't been taking oral folic acid and developed mouth ulcers; this was restarted. Cymbalta   was started in 03/2019 by rheum, diagnosed with fibromyalgia (myofascial pain listed as dx in their notes), and pain and moods are much better.  Obesity: She started Weight Watchers in October, and has lost over 33# so far. As reported above, more active since starting Cymbalta.   She saw Dr. Pollyann Kennedy for L ear pain in June. She was felt to possibly have TMJ, and to see her dentist.  She now sleeps with a mouthguard and has no more pain.  Immunization History  Administered Date(s) Administered  . Influenza Inj Mdck Quad Pf 04/11/2019  . Influenza Split 03/10/2012  . Influenza,inj,Quad PF,6+ Mos 04/11/2019  . PFIZER SARS-COV-2 Vaccination 08/27/2019, 09/10/2019  . Pneumococcal Conjugate-13 11/01/2015  . Pneumococcal Polysaccharide-23 07/02/2006, 12/30/2018  . Tdap 09/09/2012   Last Pap smear: Age 58--had a very bad experience and refuses paps and pelvic exams Last mammogram: never, is considering now Last colonoscopy: never Last DEXA: never (discussed and ordered 10/2016, never scheduled, declined again last year when discussed).  She reports Dr. Corliss Skains did not feel it was needed. Dentist: every 6 months Ophtho: yearly (due to meds) Exercise:Walks 2 miles daily with the dog. 3 mile walk with 21# pack every other day.  Once a week rides an e-bike 14 miles, trying to keep the assist mainly for the hills.  3-5 mile hike (10-11# hike) on Sundays.  Normal vitamin D level (36) in 05/2012   PMH, PSH, SH and FH reviewed and updated  Outpatient Encounter Medications as of 01/06/2020  Medication Sig  . aspirin 81 MG tablet Take 81 mg by mouth every other day.   . benazepril (LOTENSIN) 10 MG tablet Take 1 tablet (10 mg total) by mouth daily.  . Calcium-Magnesium-Vitamin D (CALCIUM MAGNESIUM PO) Take by mouth 2 (two) times a day. 1/2 pill every 3 days  . DULoxetine (CYMBALTA) 30 MG capsule TAKE 1 CAPSULE(30 MG) BY MOUTH DAILY  . fish oil-omega-3 fatty acids 1000 MG capsule Take 1 g by mouth 2  (two) times daily.   Marland Kitchen HUMIRA PEN 40 MG/0.4ML PNKT INJECT 1 PEN UNDER THE SKIN EVERY 14 DAYS.  . hydrochlorothiazide (HYDRODIURIL) 25 MG tablet Take 1 tablet (25 mg total) by mouth daily.  Marland Kitchen loratadine (CLARITIN) 10 MG tablet Take 10 mg by mouth as needed for allergies.  Marland Kitchen MAGNESIUM PO Take by mouth daily.  . methotrexate 250 MG/10ML injection INJECT 0.6MLS SUBCUTANEOUS EVERY WEEK  . Multiple Vitamins-Minerals (MULTIVITAMIN ADULT PO) Take by mouth daily.  . [DISCONTINUED] benazepril (LOTENSIN) 10 MG tablet TAKE 1 TABLET BY MOUTH DAILY  . [DISCONTINUED] hydrochlorothiazide (HYDRODIURIL) 25 MG tablet Take 1 tablet (25 mg total) by mouth daily.  . [DISCONTINUED] SYNTHROID 137 MCG tablet TAKE 1 TABLET(137 MCG) BY MOUTH DAILY  . folic acid (FOLVITE) 1 MG tablet Take 2 tablets (2 mg total) by mouth daily. (Patient not taking: Reported on 01/06/2020)  . ibuprofen (ADVIL,MOTRIN) 200 MG tablet Take 200 mg by mouth every 8 (eight) hours as needed. (Patient not taking: Reported on 01/03/2020)  . MELATONIN PO Take by  mouth as needed. (Patient not taking: Reported on 01/06/2020)  . SYNTHROID 137 MCG tablet TAKE 1 TABLET(137 MCG) BY MOUTH DAILY  . [DISCONTINUED] OVER THE COUNTER MEDICATION  (Patient not taking: Reported on 01/03/2020)  . [DISCONTINUED] predniSONE (DELTASONE) 5 MG tablet Take 4 tabs po qd x 4 days, 3  tabs po qd x 4 days, 2  tabs po qd x 4 days, 1  tab po qd x 4 days (Patient not taking: Reported on 06/30/2019)  . [DISCONTINUED] VIRT-GARD 2.2-25-1 MG TABS TAKE 1 TABLET BY MOUTH DAILY (Patient not taking: Reported on 06/30/2019)   No facility-administered encounter medications on file as of 01/06/2020.   Allergies  Allergen Reactions  . Penicillins Rash    ROS: The patient denies anorexia, fever, headaches, vision changes, decreased hearing, ear pain, sore throat, breast concerns, chest pain, palpitations, dizziness, syncope, dyspnea on exertion, cough, swelling, nausea, vomiting, diarrhea,  constipation, abdominal pain, melena, hematochezia, indigestion/heartburn, hematuria, incontinence, dysuria, vaginal discharge, odor or itch, genital lesions, numbness, tingling, weakness, tremor, suspicious skin lesions, depression, anxiety, abnormal bleeding/bruising, or enlarged lymph nodes. Had issue with L eye in 06/2018--severe pain, no vision changes.  Resolved, recurred, but currently is better again. Postmenopausal. No longer has hot flashes. Currently denies any joint or muscle pains, resolved with Cymbalta. No longer has insomnia Denies any change to the mole on her nose. Occasional migraines.   PHYSICAL EXAM:  BP 112/74   Pulse 68   Ht 5\' 7"  (1.702 m)   Wt (!) 222 lb 3.2 oz (100.8 kg)   LMP 01/13/2013   BMI 34.80 kg/m   Wt Readings from Last 3 Encounters:  01/06/20 (!) 222 lb 3.2 oz (100.8 kg)  01/03/20 (!) 222 lb 9.6 oz (101 kg)  03/31/19 254 lb 12.8 oz (115.6 kg)   247# in 10/2016  General Appearance:   Alert, cooperative, no distress, appears stated age. She declined to get into a gown, declines breast and pelvic exam, unable to perform good skin exam as well.  Head:   Normocephalic, without obvious abnormality, atraumatic  Eyes:   PERRL, conjunctiva/corneas clear, EOM's intact, fundi benign (though some changes noted on L; no acute abnl)  Ears:   Normal TM's and external ear canals  Nose:  Not examined (wearing mask due to COVID-19 pandemic)  Throat:  Not examined (wearing mask due to COVID-19 pandemic)  Neck:  Supple, no lymphadenopathy; thyroid: no enlargement/ tenderness/nodules; no carotid bruit or JVD  Back:  Spine nontender, no curvature, ROM normal, no CVA tenderness. No trigger points  Lungs:   Clear to auscultation bilaterally without wheezes, rales or ronchi; respirations unlabored  Chest Wall:   No tenderness or deformity  Heart:   Regular rate and rhythm, S1 and S2 normal, no murmur, rub or gallop   Breast Exam:   exam refused by patient  Abdomen:   Soft, non-tender, nondistended, normoactive bowel sounds, no masses, no hepatosplenomegaly  Genitalia:   Refused by patient  Rectal:   Refused by patient  Extremities:  No clubbing, cyanosis or edema  Pulses:  2+ and symmetric all extremities  Skin:  Skin color, texture, turgor normal, no rashes. Nevus on nose, uniform color  Lymph nodes:  Cervical, supraclavicular nodes normal  Neurologic:  CNII-XII intact, normal strength, sensation and gait; reflexes 2+ and symmetric throughout   Psych: Normal mood, affect, hygiene and grooming. She is in great spirits today   Lab Results  Component Value Date   CHOL 237 (H) 12/29/2019  HDL 51 12/29/2019   LDLCALC 156 (H) 12/29/2019   TRIG 163 (H) 12/29/2019   CHOLHDL 4.6 (H) 12/29/2019   Fasting glucose 108    Chemistry      Component Value Date/Time   NA 142 12/29/2019 1040   K 4.2 12/29/2019 1040   CL 102 12/29/2019 1040   CO2 26 12/29/2019 1040   BUN 17 12/29/2019 1040   CREATININE 0.89 12/29/2019 1040   CREATININE 0.87 07/01/2019 1439      Component Value Date/Time   CALCIUM 9.5 12/29/2019 1040   ALKPHOS 70 12/29/2019 1040   AST 19 12/29/2019 1040   ALT 26 12/29/2019 1040   BILITOT 0.6 12/29/2019 1040     ASCVD low risk  Lab Results  Component Value Date   HGBA1C 5.9 (A) 01/06/2020   Lab Results  Component Value Date   TSH 0.885 01/06/2020    ASSESSMENT/PLAN:  Annual physical exam - Plan: POCT Urinalysis DIP (Proadvantage Device)  IFG (impaired fasting glucose) - reviewed diet, encouraged continued weight loss and daily exercise - Plan: HgB A1c  Mixed hyperlipidemia - cut back on red meat. ASCVD risk is low risk. Reviewed lowfat, low cholesterol diet  Hypothyroidism, unspecified type - Plan: TSH, SYNTHROID 137 MCG tablet  Rheumatoid arthritis involving multiple sites with positive rheumatoid factor  (HCC) - doing well on current regimen per rheum  Screening for osteoporosis - Plan: DG Bone Density  Postmenopausal estrogen deficiency - Plan: DG Bone Density  At risk for osteoporosis - Plan: DG Bone Density  Essential hypertension, benign - well controlled - Plan: benazepril (LOTENSIN) 10 MG tablet  Class 1 obesity due to excess calories with serious comorbidity and body mass index (BMI) of 34.0 to 34.9 in adult - Doing well on [employer], cont wt loss.  Comorbidities--HTN, HLD  Fibromyalgia - pain resolved with cymbalta per rheum. Also helping with moods, related to chronic pain.   Discussed monthly self breast exams and yearly mammograms(past due, patient now willing to consider--discussed potential outcomes of delayed dx of CA without mammo screening); at least 30 minutes of aerobic activity at least 5 days/week, weight-bearing exercise at least 2x/week; proper sunscreen use reviewed; healthy diet, including goals of calcium and vitamin D intake and alcohol recommendations (less than or equal to 1 drink/day) reviewed; regular seatbelt use; changing batteries in smoke detectors. Immunization recommendations discussed--continue yearly flu shots. Shingrix recommended, risks/SE reviewed. To check insurance and schedule [place] when convenient. Colon cancer screening options were reviewed--past due.  Discussed Colonoscopy vs Cologuard in detail. She may be willing to do this, wants time to think about and decide.  Discussed reasons for screening in detail (and difference between mammos, which can just detect CA earlier, removing adenomatous polyps at colonoscopy could actually prevent a cancer). Discussed recommendation for DEXA (given h/o frequent prednisone use in past), and is willing to do this now, and may consider getting mammogram as well.  Order for DEXA entered, and [facility] either/both of these. Pap past due--refused. She is lower risk for cervical  cancer, but discussed other reasons for at least doing a pelvic exam (avoiding speculum and pap smear), but she wasn't interested in this either.  Counseled extensively regarding risks associated with delayed screening for breast cancer, female cancers, colon cancer. Strongly encouraged her to reconsider pelvic/pap, and to get 3D mammogram and colonoscopy or Cologuard.  Today, compared to last year, she is more willing to have the studies (just not  the exam today).  Over 60 minutes spent FTF, plus additional time spent in chart review and documentation.

## 2020-01-06 ENCOUNTER — Encounter: Payer: Self-pay | Admitting: Family Medicine

## 2020-01-06 ENCOUNTER — Ambulatory Visit: Payer: BC Managed Care – PPO | Admitting: Family Medicine

## 2020-01-06 ENCOUNTER — Other Ambulatory Visit: Payer: Self-pay

## 2020-01-06 ENCOUNTER — Encounter: Payer: Self-pay | Admitting: *Deleted

## 2020-01-06 VITALS — BP 112/74 | HR 68 | Ht 67.0 in | Wt 222.2 lb

## 2020-01-06 DIAGNOSIS — Z13820 Encounter for screening for osteoporosis: Secondary | ICD-10-CM

## 2020-01-06 DIAGNOSIS — Z0000 Encounter for general adult medical examination without abnormal findings: Secondary | ICD-10-CM | POA: Diagnosis not present

## 2020-01-06 DIAGNOSIS — Z9189 Other specified personal risk factors, not elsewhere classified: Secondary | ICD-10-CM | POA: Diagnosis not present

## 2020-01-06 DIAGNOSIS — M797 Fibromyalgia: Secondary | ICD-10-CM

## 2020-01-06 DIAGNOSIS — Z780 Asymptomatic menopausal state: Secondary | ICD-10-CM

## 2020-01-06 DIAGNOSIS — M0579 Rheumatoid arthritis with rheumatoid factor of multiple sites without organ or systems involvement: Secondary | ICD-10-CM

## 2020-01-06 DIAGNOSIS — I10 Essential (primary) hypertension: Secondary | ICD-10-CM

## 2020-01-06 DIAGNOSIS — R7301 Impaired fasting glucose: Secondary | ICD-10-CM | POA: Diagnosis not present

## 2020-01-06 DIAGNOSIS — E782 Mixed hyperlipidemia: Secondary | ICD-10-CM

## 2020-01-06 DIAGNOSIS — E039 Hypothyroidism, unspecified: Secondary | ICD-10-CM

## 2020-01-06 DIAGNOSIS — Z6834 Body mass index (BMI) 34.0-34.9, adult: Secondary | ICD-10-CM

## 2020-01-06 DIAGNOSIS — E66811 Obesity, class 1: Secondary | ICD-10-CM

## 2020-01-06 DIAGNOSIS — E6609 Other obesity due to excess calories: Secondary | ICD-10-CM

## 2020-01-06 LAB — POCT GLYCOSYLATED HEMOGLOBIN (HGB A1C): Hemoglobin A1C: 5.9 % — AB (ref 4.0–5.6)

## 2020-01-06 LAB — POCT URINALYSIS DIP (PROADVANTAGE DEVICE)
Bilirubin, UA: NEGATIVE
Blood, UA: NEGATIVE
Glucose, UA: NEGATIVE mg/dL
Ketones, POC UA: NEGATIVE mg/dL
Leukocytes, UA: NEGATIVE
Nitrite, UA: NEGATIVE
Protein Ur, POC: NEGATIVE mg/dL
Specific Gravity, Urine: 1.01
Urobilinogen, Ur: NEGATIVE
pH, UA: 6.5 (ref 5.0–8.0)

## 2020-01-06 MED ORDER — BENAZEPRIL HCL 10 MG PO TABS: 10.0000 mg | ORAL_TABLET | Freq: Every day | ORAL | 3 refills | Status: DC

## 2020-01-06 MED ORDER — HYDROCHLOROTHIAZIDE 25 MG PO TABS: 25.0000 mg | ORAL_TABLET | Freq: Every day | ORAL | 3 refills | Status: DC

## 2020-01-07 DIAGNOSIS — M797 Fibromyalgia: Secondary | ICD-10-CM | POA: Insufficient documentation

## 2020-01-07 LAB — TSH: TSH: 0.885 u[IU]/mL (ref 0.450–4.500)

## 2020-01-07 MED ORDER — SYNTHROID 137 MCG PO TABS: ORAL_TABLET | ORAL | 3 refills | Status: DC

## 2020-01-15 ENCOUNTER — Other Ambulatory Visit: Payer: Self-pay | Admitting: Family Medicine

## 2020-01-21 ENCOUNTER — Other Ambulatory Visit: Payer: Self-pay | Admitting: Rheumatology

## 2020-01-21 NOTE — Telephone Encounter (Signed)
Last Visit: 01/03/2020 Next Visit: 06/12/2020 Labs: 12/29/2019 Glucose 108, rest of CMP and CBC WNL  Current Dose per office note 01/03/2020: methotrexate 0.6 ml every 7 days DX: Rheumatoid arthritis involving multiple sites with positive rheumatoid factor   Okay to refill per Dr. Corliss Skains

## 2020-01-25 ENCOUNTER — Telehealth: Payer: Self-pay

## 2020-01-25 DIAGNOSIS — E039 Hypothyroidism, unspecified: Secondary | ICD-10-CM

## 2020-01-25 NOTE — Telephone Encounter (Signed)
Pt. Called stating she picked up her thyroid medicine from the pharmacy and they gave her levothyroxine instead of synthroid. She said you guys had discussed not changing her to levothyroxine so she was not sure if you were ok with her taking levothyroxine or does she need a new script sent in for the synthroid.

## 2020-01-25 NOTE — Telephone Encounter (Signed)
Spoke with pharmacist--I had sent in DAW Synthroid, but it was dispensed wrong.  He apologized, and is getting the Synthroid ready.  Message sent to pt advising of this.

## 2020-01-26 NOTE — Telephone Encounter (Signed)
Called pt. LM about her medication and that the pharmacy made a mistake.

## 2020-01-26 NOTE — Telephone Encounter (Signed)
She hasn't read the message that I sent her last night.  Please advise.  Thanks

## 2020-02-04 ENCOUNTER — Other Ambulatory Visit: Payer: Self-pay | Admitting: Rheumatology

## 2020-02-04 MED ORDER — PREDNISONE 5 MG PO TABS
ORAL_TABLET | ORAL | 0 refills | Status: DC
Start: 2020-02-04 — End: 2020-06-12

## 2020-02-04 NOTE — Telephone Encounter (Signed)
Patient states she stopped her Humira and Methotrexate to have her Covid vaccine. Patient she received her vaccine 01/29/2020. Patient states she stopped her Methotrexate and Humira 2 weeks ago. Patient states she is having a flare. She is having trouble standing and is unable to work today.Patient states she is unable to hold a coffee cup. Patient states she is also under an increased amount of stress as her mother has just been diagnosed with lung cancer. Please advise.

## 2020-02-04 NOTE — Telephone Encounter (Signed)
Please advise the patient to resume Humira and MTX.  Ok to send in a prednisone taper starting at 20 mg tapering by 5 mg every 2 days.  Please advise the patient to notify us if her joint pain and inflammation persists.

## 2020-02-04 NOTE — Telephone Encounter (Signed)
Patient left a voicemail stating she had to stop taking her medication due to getting her 3rd vaccine and now is experiencing pain.  Patient is requesting a prescription of Prednisone to be sent to [facility] at [address]

## 2020-02-04 NOTE — Telephone Encounter (Signed)
Patient advised to resume Humira and MTX.  Patient advised a prescription has been sent to the pharmacy. Patient advised to notify us if her joint pain and inflammation persists.

## 2020-04-04 ENCOUNTER — Telehealth: Payer: Self-pay

## 2020-04-04 DIAGNOSIS — M0579 Rheumatoid arthritis with rheumatoid factor of multiple sites without organ or systems involvement: Secondary | ICD-10-CM

## 2020-04-04 DIAGNOSIS — M7918 Myalgia, other site: Secondary | ICD-10-CM

## 2020-04-04 MED ORDER — DULOXETINE HCL 30 MG PO CPEP: ORAL_CAPSULE | ORAL | 2 refills | Status: DC

## 2020-04-04 NOTE — Telephone Encounter (Signed)
Patient called requesting prescription refill of Duloxetine to be sent to [facility] at [address] [employer] in [place].  Patient states she is out of medication and is requesting the refill to be sent ASAP.

## 2020-04-04 NOTE — Telephone Encounter (Signed)
Last Visit: 01/03/2020 Next Visit: 06/12/2020  Okay to refill per Dr. Corliss Skains

## 2020-05-29 NOTE — Progress Notes (Signed)
Office Visit Note  [name]             Date of Birth: 1965-11-22           MRN: [id number]             PCP: Jill Ohara, MD Referring: Jill Ohara, MD Visit Date: 06/12/2020 Occupation: _0 @  Subjective:  Medication monitoring   History of Present Illness: Jill Mathews is a 54 y.o. female with history of seronegative rheumatoid arthritis.  She is on Humira 40 mg sq injection once every 14 days, MTX 0.6 ml sq injections once weekly, and folic acid 2 mg daily.  She has not missed any doses of methotrexate or Humira recently.  She denies any recent rheumatoid arthritis flares.  She has been experiencing occasional discomfort in her left knee specifically in the patella tendon.  She denies any joint swelling at this time.  She denies any recent fibromyalgia flares.  Her myalgias and muscle tenderness improved significantly after starting on Cymbalta 30 mg 1 capsule by mouth daily. She denies any recent infections.    Activities of Daily Living:  Patient reports morning stiffness for 0  minutes.   Patient Denies nocturnal pain.  Difficulty dressing/grooming: Denies Difficulty climbing stairs: Denies Difficulty getting out of chair: Denies Difficulty using hands for taps, buttons, cutlery, and/or writing: Denies  Review of Systems  Constitutional: Negative for fatigue.  HENT: Positive for nose dryness. Negative for mouth sores and mouth dryness.   Eyes: Positive for dryness. Negative for pain and itching.  Respiratory: Negative for shortness of breath and difficulty breathing.   Cardiovascular: Negative for chest pain and palpitations.  Gastrointestinal: Positive for constipation. Negative for blood in stool and diarrhea.  Endocrine: Negative for increased urination.  Genitourinary: Negative for difficulty urinating.  Musculoskeletal: Positive for arthralgias, joint pain, myalgias and myalgias. Negative for joint swelling, morning stiffness and muscle  tenderness.  Skin: Negative for color change, rash and redness.  Allergic/Immunologic: Negative for susceptible to infections.  Neurological: Negative for dizziness, numbness, headaches, memory loss and weakness.  Hematological: Negative for bruising/bleeding tendency.  Psychiatric/Behavioral: Positive for sleep disturbance. Negative for confusion.    PMFS History:  Patient Active Problem List   Diagnosis Date Noted  . Fibromyalgia 01/07/2020  . HLA B27 (HLA B27 positive) 10/24/2016  . Prediabetes 10/31/2014  . Mixed hyperlipidemia 09/09/2012  . Hypothyroidism 05/25/2012  . Essential hypertension, benign 05/25/2012  . Rheumatoid arthritis involving multiple sites with positive rheumatoid factor ([place]) 05/25/2012  . Obesity (BMI 30-39.9) 05/25/2012    Past Medical History:  Diagnosis Date  . Fibromyalgia 03/2019   put on cymbalta, and pain resolved  . Hyperlipidemia    borderline; dietary management  . Hypertension   . Hypothyroidism 2001  . Rheumatoid arthritis(714.0) 04/2006   Dr. Estanislado Pandy manages    Family History  Problem Relation Age of Onset  . Heart disease Mother 61       MI, stents x 2; CABG age 61  . Stroke Mother 11  . Hyperlipidemia Mother   . Hypertension Mother   . Depression Mother   . Diabetes Mother   . Thyroid disease Mother        Graves disease, s/p RAI, now hypothyroid  . Irritable bowel syndrome Mother   . COPD Mother   . Colon polyps Mother   . Cancer Mother        skin (not melanoma)  . Kidney Stones Mother   . Lung cancer Mother   .  Stroke Father 34  . Hyperlipidemia Father   . Hypertension Father   . Depression Father   . Diabetes Father   . Thyroid disease Father        hypothyroidism  . COPD Father   . Heart disease Father 9       MI; s/p CABG; pacemaker  . Cancer Sister 103       pancreatic cancer  . Bipolar disorder Sister   . Lupus Paternal Aunt   . Heart disease Maternal Uncle        s/p CABG, late 66's  . Cancer Maternal  Grandfather        laryngeal, smoker  . Multiple sclerosis Cousin    Past Surgical History:  Procedure Laterality Date  . 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 PILONIDAL CYST EXCISION  1983  . TONSILLECTOMY  age 18  . WISDOM TOOTH EXTRACTION     Social History   Social History Narrative   Lives with female partner Jill Mathews) of 37 years--got married 06/11/13. 3 cats, 1 dog   2 cats 1 dog.   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   Immunization History  Administered Date(s) Administered  . Influenza Inj Mdck Quad Pf 04/11/2019  . Influenza Split 03/10/2012  . Influenza,inj,Quad PF,6+ Mos 04/11/2019  . PFIZER SARS-COV-2 Vaccination 08/27/2019, 09/10/2019, 01/09/2020  . Pneumococcal Conjugate-13 11/01/2015  . Pneumococcal Polysaccharide-23 07/02/2006, 12/30/2018  . Tdap 09/09/2012     Objective: Vital Signs: BP 130/85 (BP Location: Left Arm, Patient Position: Sitting, Cuff Size: Normal)   Pulse 65   Resp 16   Ht _0  (1.702 m)   Wt 231 lb 9.6 oz (105.1 kg)   LMP 01/13/2013   BMI 36.27 kg/m    Physical Exam Vitals and nursing note reviewed.  Constitutional:      Appearance: She is well-developed and well-nourished.  HENT:     Head: Normocephalic and atraumatic.  Eyes:     Extraocular Movements: EOM normal.     Conjunctiva/sclera: Conjunctivae normal.  Cardiovascular:     Pulses: Intact distal pulses.  Pulmonary:     Effort: Pulmonary effort is normal.  Abdominal:     Palpations: Abdomen is soft.  Musculoskeletal:     Cervical back: Normal range of motion.  Skin:    General: Skin is warm and dry.     Capillary Refill: Capillary refill takes less than 2 seconds.  Neurological:     Mental Status: She is alert and oriented to person, place, and time.  Psychiatric:        Mood and Affect:  Mood and affect normal.        Behavior: Behavior normal.      Musculoskeletal Exam: C-spine, thoracic spine, and lumbar spine good ROM. No midline spinal tenderness.  No SI joint tenderness. Shoulder joints, elbow joints, wrist joints, MCPs, PIPs, and DIPs good ROM with no synovitis.  Complete fist formation bilaterally. 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   Hip joints, knee joints, ankle joints, and MTPs good ROM with no discomfort. No warmth or effusion of knee joints.  No tenderness or swelling of ankle joints. Tenderness over the left 5th MTP joint.    CDAI Exam: CDAI Score: 0.2  Patient Global: 1 mm; Provider Global: 1  mm Swollen: 0 ; Tender: 0  Joint Exam 06/12/2020   No joint exam has been documented for this visit   There is currently no information documented on the homunculus. Go to the Rheumatology activity and complete the homunculus joint exam.  Investigation: No additional findings.  Imaging: No results found.  Recent Labs: Lab Results  Component Value Date   WBC 6.1 12/29/2019   HGB 15.0 12/29/2019   PLT 308 12/29/2019   NA 142 12/29/2019   K 4.2 12/29/2019   CL 102 12/29/2019   CO2 26 12/29/2019   GLUCOSE 108 (H) 12/29/2019   BUN 17 12/29/2019   CREATININE 0.89 12/29/2019   BILITOT 0.6 12/29/2019   ALKPHOS 70 12/29/2019   AST 19 12/29/2019   ALT 26 12/29/2019   PROT 6.4 12/29/2019   ALBUMIN 4.2 12/29/2019   CALCIUM 9.5 12/29/2019   GFRAA 84 12/29/2019   QFTBGOLD NEGATIVE 05/05/2017   QFTBGOLDPLUS NEGATIVE 07/01/2019    Speciality Comments: Prior therapy includes: Enbrel (inadequate response), Plaquenil (inadequate response)   Procedures:  No procedures performed Allergies: Penicillins   Assessment / Plan:     Visit Diagnoses: Rheumatoid arthritis involving multiple sites with positive rheumatoid factor (HCC) -  +RF,+ anti-CCP: She has no synovitis or joint tenderness on exam.  She has not had any recent  rheumatoid arthritis flares.  She is clinically doing well on Humira 40 mg sq injections once every 14 days, MTX 0.6 ml sq injections once weekly, and folic acid 2 mg daily.  She has not missed any doses of Humira or methotrexate recently.  She experiences occasional arthralgias but has not noticed any joint inflammation.  She takes Aleve very sparingly for pain relief.  Overall she has noticed a significant clinical improvement in myalgias and arthralgias since starting on Cymbalta 30 mg 1 capsule by mouth daily.  She will continue on the current treatment regimen.  Refill of humira was sent to the pharmacy today.  She was advised to notify us if she develops signs or symptoms of a flare. She will follow up in 5 months.   - Plan: Adalimumab (HUMIRA PEN) 40 MG/0.4ML PNKT  High risk medication use - Humira 40 mg sq injections every 14 days (Started in February 2020), methotrexate 0.6 ml every 7 days, and folic acid 2 mg po daily.  CBC and CMP were drawn on 12/29/2019.  She is overdue to update lab work today.  Orders for CBC and CMP were released.  Her next lab work will be due in April and every 3 months to monitor for drug toxicity.  Standing orders for CBC and CMP remain in place.  TB gold was negative on 07/01/2019.  Order for TB gold was released today and will continue to be monitored yearly. - Plan: CBC with Differential/Platelet, COMPLETE METABOLIC PANEL WITH GFR She has not had any recent infections. She was advised to hold humira and methotrexate if she develops signs or symptoms of an infection and to resume once the infection has completely cleared.   She has received all 3 pfizer covid-19 vaccinations. Discussed the importance of yearly skin exams while on Humira.  Screening for tuberculosis - TB gold order released today. Plan: QuantiFERON-TB Gold Plus  HLA B27 (HLA B27 positive)  Myofascial pain: She noticed significant clinical improvement in her myalgias and muscle tenderness after starting  on Cymbalta 30 mg 1 capsule by mouth daily.  She will continue on the current treatment regimen.  A refill was sent to  the pharmacy.  Discussed the importance of regular exercise and good sleep hygiene.   Morton's neuroma of right foot: Discomfort resolved after surgical intervention. She wears proper fitting shoes.   Other medical conditions are listed as follows:   Keratitis  History of hypothyroidism  Prediabetes  History of hyperlipidemia  History of hypertension: BP 130/85 today.   Other fatigue: Stable.     Orders: Orders Placed This Encounter  Procedures  . CBC with Differential/Platelet  . COMPLETE METABOLIC PANEL WITH GFR  . QuantiFERON-TB Gold Plus   Meds ordered this encounter  Medications  . Adalimumab (HUMIRA PEN) 40 MG/0.4ML PNKT    Sig: INJECT 1 PEN UNDER THE SKIN EVERY 14 DAYS.    Dispense:  3 each    Refill:  0    3 kits = 6 pens  . DULoxetine (CYMBALTA) 30 MG capsule    Sig: TAKE 1 CAPSULE(30 MG) BY MOUTH DAILY    Dispense:  90 capsule    Refill:  1      Follow-Up Instructions: Return in about 5 months (around 11/10/2020) for Rheumatoid arthritis.   Ofilia Neas, PA-C  Note - This record has been created using Dragon software.  Chart creation errors have been sought, but may not always  have been located. Such creation errors do not reflect on  the standard of medical care.

## 2020-06-12 ENCOUNTER — Other Ambulatory Visit: Payer: Self-pay

## 2020-06-12 ENCOUNTER — Ambulatory Visit: Payer: BC Managed Care – PPO | Admitting: Physician Assistant

## 2020-06-12 ENCOUNTER — Encounter: Payer: Self-pay | Admitting: Physician Assistant

## 2020-06-12 ENCOUNTER — Other Ambulatory Visit: Payer: Self-pay | Admitting: Rheumatology

## 2020-06-12 VITALS — BP 130/85 | HR 65 | Resp 16 | Ht 67.0 in | Wt 231.6 lb

## 2020-06-12 DIAGNOSIS — H169 Unspecified keratitis: Secondary | ICD-10-CM

## 2020-06-12 DIAGNOSIS — Z8679 Personal history of other diseases of the circulatory system: Secondary | ICD-10-CM

## 2020-06-12 DIAGNOSIS — R7303 Prediabetes: Secondary | ICD-10-CM

## 2020-06-12 DIAGNOSIS — Z111 Encounter for screening for respiratory tuberculosis: Secondary | ICD-10-CM

## 2020-06-12 DIAGNOSIS — M0579 Rheumatoid arthritis with rheumatoid factor of multiple sites without organ or systems involvement: Secondary | ICD-10-CM

## 2020-06-12 DIAGNOSIS — M7918 Myalgia, other site: Secondary | ICD-10-CM

## 2020-06-12 DIAGNOSIS — Z1589 Genetic susceptibility to other disease: Secondary | ICD-10-CM | POA: Diagnosis not present

## 2020-06-12 DIAGNOSIS — Z79899 Other long term (current) drug therapy: Secondary | ICD-10-CM

## 2020-06-12 DIAGNOSIS — G5761 Lesion of plantar nerve, right lower limb: Secondary | ICD-10-CM

## 2020-06-12 DIAGNOSIS — Z8639 Personal history of other endocrine, nutritional and metabolic disease: Secondary | ICD-10-CM

## 2020-06-12 DIAGNOSIS — R5383 Other fatigue: Secondary | ICD-10-CM

## 2020-06-12 MED ORDER — HUMIRA (2 PEN) 40 MG/0.4ML ~~LOC~~ AJKT: AUTO-INJECTOR | SUBCUTANEOUS | 0 refills | Status: DC

## 2020-06-12 MED ORDER — DULOXETINE HCL 30 MG PO CPEP: ORAL_CAPSULE | ORAL | 1 refills | Status: DC

## 2020-06-12 NOTE — Patient Instructions (Signed)
Standing Labs We placed an order today for your standing lab work.   Please have your standing labs drawn in April and every 3 months   If possible, please have your labs drawn 2 weeks prior to your appointment so that the provider can discuss your results at your appointment.  We have open lab daily Monday through Thursday from 8:30-12:30 PM and 1:30-4:30 PM and Friday from 8:30-12:30 PM and 1:30-4:00 PM at the office of Dr. Shaili Deveshwar, [facility].   Please be advised, patients with office appointments requiring lab work will take precedents over walk-in lab work.  If possible, please come for your lab work on Monday and Friday afternoons, as you may experience shorter wait times. The office is located at [address], [address], [place], [place] [address] No appointment is necessary.   Labs are drawn by [facility]. Please bring your co-pay at the time of your lab draw.  You may receive a bill from [facility] for your lab work.  If you wish to have your labs drawn at another location, please call the office 24 hours in advance to send orders.  If you have any questions regarding directions or hours of operation,  please call [phone].   As a reminder, please drink plenty of water prior to coming for your lab work. Thanks!   

## 2020-06-13 NOTE — Progress Notes (Signed)
Total protein borderline low-6.0. rest of CMP WNL. CBC WNL.

## 2020-06-14 ENCOUNTER — Telehealth: Payer: Self-pay | Admitting: Pharmacist

## 2020-06-14 LAB — CBC WITH DIFFERENTIAL/PLATELET
Absolute Monocytes: 534 cells/uL (ref 200–950)
Basophils Absolute: 42 cells/uL (ref 0–200)
Basophils Relative: 0.7 %
Eosinophils Absolute: 222 cells/uL (ref 15–500)
Eosinophils Relative: 3.7 %
HCT: 43.9 % (ref 35.0–45.0)
Hemoglobin: 14.6 g/dL (ref 11.7–15.5)
Lymphs Abs: 2802 cells/uL (ref 850–3900)
MCH: 31.5 pg (ref 27.0–33.0)
MCHC: 33.3 g/dL (ref 32.0–36.0)
MCV: 94.8 fL (ref 80.0–100.0)
MPV: 11.2 fL (ref 7.5–12.5)
Monocytes Relative: 8.9 %
Neutro Abs: 2400 cells/uL (ref 1500–7800)
Neutrophils Relative %: 40 %
Platelets: 267 10*3/uL (ref 140–400)
RBC: 4.63 10*6/uL (ref 3.80–5.10)
RDW: 13.4 % (ref 11.0–15.0)
Total Lymphocyte: 46.7 %
WBC: 6 10*3/uL (ref 3.8–10.8)

## 2020-06-14 LAB — COMPLETE METABOLIC PANEL WITH GFR
AG Ratio: 2.2 (calc) (ref 1.0–2.5)
ALT: 22 U/L (ref 6–29)
AST: 17 U/L (ref 10–35)
Albumin: 4.1 g/dL (ref 3.6–5.1)
Alkaline phosphatase (APISO): 54 U/L (ref 37–153)
BUN: 19 mg/dL (ref 7–25)
CO2: 28 mmol/L (ref 20–32)
Calcium: 9.6 mg/dL (ref 8.6–10.4)
Chloride: 101 mmol/L (ref 98–110)
Creat: 0.81 mg/dL (ref 0.50–1.05)
GFR, Est African American: 94 mL/min/{1.73_m2} (ref >=60)
GFR, Est Non African American: 81 mL/min/{1.73_m2} (ref >=60)
Globulin: 1.9 g/dL (calc) (ref 1.9–3.7)
Glucose, Bld: 75 mg/dL (ref 65–99)
Potassium: 4.2 mmol/L (ref 3.5–5.3)
Sodium: 138 mmol/L (ref 135–146)
Total Bilirubin: 0.6 mg/dL (ref 0.2–1.2)
Total Protein: 6 g/dL — ABNORMAL LOW (ref 6.1–8.1)

## 2020-06-14 LAB — QUANTIFERON-TB GOLD PLUS
Mitogen-NIL: >10 IU/mL
NIL: 0.04 IU/mL
QuantiFERON-TB Gold Plus: NEGATIVE
TB1-NIL: 0.01 IU/mL
TB2-NIL: 0.01 IU/mL

## 2020-06-14 NOTE — Progress Notes (Signed)
TB gold negative

## 2020-06-14 NOTE — Telephone Encounter (Signed)
Received notification from [facility] regarding a prior authorization for HUMIRA. Authorization has been APPROVED from 06/14/20 to 06/14/21.   Authorization # [id number] Phone # [phone]

## 2020-06-23 ENCOUNTER — Telehealth: Payer: Self-pay | Admitting: Rheumatology

## 2020-06-23 ENCOUNTER — Other Ambulatory Visit: Payer: Self-pay

## 2020-06-23 MED ORDER — PREDNISONE 5 MG PO TABS: ORAL_TABLET | ORAL | 0 refills | Status: DC

## 2020-06-23 NOTE — Telephone Encounter (Signed)
Patient called stating she is having pain in all of her joints.   Patient is requesting a prescription  of Prednisone be sent to [facility] at [address] [employer].

## 2020-06-23 NOTE — Telephone Encounter (Signed)
I tried to call patient earlier but there was no response.  Patient called back and stated that she has been experiencing increased pain all over.  It is difficult for her to differentiate between the rheumatoid arthritis and fibromyalgia pain.  She denies any joint swelling.  I advised her to do  stretching exercises and try soaking in the bathtub.  If she does not see any joint swelling than she does not need to pick up the prednisone prescription.  If she develops joint swelling then she can pick up the prescription for  prednisone.  Side effects of prednisone were discussed.  Prescription was sent to the pharmacy for 5 mg tablets starting at 20 mg and taper by 5 mg every 2 days.  Patient advised to contact us in case her symptoms gets worse. Pollyann Savoy, MD

## 2020-06-23 NOTE — Telephone Encounter (Signed)
Please call in prednisone starting at 20 mg and taper by 5 mg every 2 days.  Patient was clinically doing well at the last visit a week ago.

## 2020-07-14 ENCOUNTER — Other Ambulatory Visit: Payer: Self-pay

## 2020-07-14 MED ORDER — DULOXETINE HCL 60 MG PO CPEP: 60.0000 mg | ORAL_CAPSULE | Freq: Every day | ORAL | 2 refills | Status: DC

## 2020-07-14 NOTE — Telephone Encounter (Signed)
Patient called stating she is "experiencing a significant fibro flair."  Patient states she has "profound exhaustion" and pain is more muscle although her joints hurt also.  She also has "tingling" in the toes of her left foot.  Patient states she cannot miss work every time it rains.  Patient is asking if she should increase her Duloxetine.  Please advise.

## 2020-07-14 NOTE — Telephone Encounter (Signed)
Patient advised ok to increase cymbalta to 60 mg 1 capsule by mouth daily.  Patient advised potential side effects to monitor for include nausea, constipation, dizziness, headaches, excessive sweating, urinary retention, or an elevation in blood pressure.  Patient expressed understanding.

## 2020-07-14 NOTE — Telephone Encounter (Signed)
Ok to increase cymbalta to 60 mg 1 capsule by mouth daily.  Please discuss potential side effects to monitor for including nausea, constipation, dizziness, headaches, excessive sweating, urinary retention, or an elevation in blood pressure.

## 2020-07-17 ENCOUNTER — Telehealth: Payer: Self-pay

## 2020-07-17 NOTE — Telephone Encounter (Signed)
Patient called stating she "cannot go into work today due to her exhaustion and pain."  Patient states the Duloxetine has helped with 80% of the pain, but is still experiencing "extreme" exhaustion.  Patient is requesting a note to excuse her from work today and if possible the entire week.

## 2020-07-17 NOTE — Telephone Encounter (Signed)
Ok to provide work note to excuse the patient due to the severity of the flare.  She can return to work on 07/24/20.

## 2020-07-18 ENCOUNTER — Encounter: Payer: Self-pay | Admitting: *Deleted

## 2020-07-18 NOTE — Telephone Encounter (Signed)
I called patient, work note at front desk. 

## 2020-08-07 ENCOUNTER — Other Ambulatory Visit: Payer: Self-pay

## 2020-08-07 ENCOUNTER — Encounter: Payer: Self-pay | Admitting: Physician Assistant

## 2020-08-07 ENCOUNTER — Ambulatory Visit: Payer: BC Managed Care – PPO | Admitting: Physician Assistant

## 2020-08-07 ENCOUNTER — Other Ambulatory Visit: Payer: Self-pay | Admitting: *Deleted

## 2020-08-07 VITALS — BP 140/93 | HR 62 | Resp 17 | Ht 67.0 in | Wt 232.6 lb

## 2020-08-07 DIAGNOSIS — Z8679 Personal history of other diseases of the circulatory system: Secondary | ICD-10-CM

## 2020-08-07 DIAGNOSIS — M7918 Myalgia, other site: Secondary | ICD-10-CM

## 2020-08-07 DIAGNOSIS — M791 Myalgia, unspecified site: Secondary | ICD-10-CM

## 2020-08-07 DIAGNOSIS — M62838 Other muscle spasm: Secondary | ICD-10-CM

## 2020-08-07 DIAGNOSIS — M0579 Rheumatoid arthritis with rheumatoid factor of multiple sites without organ or systems involvement: Secondary | ICD-10-CM | POA: Diagnosis not present

## 2020-08-07 DIAGNOSIS — Z79899 Other long term (current) drug therapy: Secondary | ICD-10-CM | POA: Diagnosis not present

## 2020-08-07 DIAGNOSIS — G5761 Lesion of plantar nerve, right lower limb: Secondary | ICD-10-CM

## 2020-08-07 DIAGNOSIS — R5383 Other fatigue: Secondary | ICD-10-CM

## 2020-08-07 DIAGNOSIS — R7303 Prediabetes: Secondary | ICD-10-CM

## 2020-08-07 DIAGNOSIS — Z1589 Genetic susceptibility to other disease: Secondary | ICD-10-CM | POA: Diagnosis not present

## 2020-08-07 DIAGNOSIS — Z8639 Personal history of other endocrine, nutritional and metabolic disease: Secondary | ICD-10-CM

## 2020-08-07 DIAGNOSIS — H169 Unspecified keratitis: Secondary | ICD-10-CM

## 2020-08-07 MED ORDER — METHOCARBAMOL 500 MG PO TABS: 500.0000 mg | ORAL_TABLET | Freq: Two times a day (BID) | ORAL | 0 refills | Status: DC

## 2020-08-07 MED ORDER — LIDOCAINE HCL 1 % IJ SOLN
0.5000 mL | INTRAMUSCULAR | Status: AC | PRN
  Administered 2020-08-07: .5 mL

## 2020-08-07 MED ORDER — TRIAMCINOLONE ACETONIDE 40 MG/ML IJ SUSP
10.0000 mg | INTRAMUSCULAR | Status: AC | PRN
  Administered 2020-08-07: 10 mg via INTRAMUSCULAR

## 2020-08-07 MED ORDER — TRIAMCINOLONE ACETONIDE 40 MG/ML IJ SUSP
10.0000 mg | INTRAMUSCULAR | Status: AC | PRN
Start: 2020-08-07 — End: 2020-08-07
  Administered 2020-08-07: 10 mg via INTRAMUSCULAR

## 2020-08-07 NOTE — Telephone Encounter (Addendum)
[facility] faxed Drug Change Request, methocarbamol not covered by patient's plan, advised to change to Flexeril 10 mg @ bedtime, patient advised flexeril can cause drowsiness, only take at night. Patient does not want to change to Flexeril. Patient will pay for methocarbamol out of pocket.

## 2020-08-07 NOTE — Progress Notes (Signed)
Office Visit Note  [name]             Date of Birth: 1965-11-04           MRN: [id number]             PCP: Rita Ohara, MD Referring: Rita Ohara, MD Visit Date: 08/07/2020 Occupation: @GUAROCC @  Subjective:  Generalized pain   History of Present Illness: Jill Mathews is a 55 y.o. female with history of seropositive rheumatoid arthritis and myofascial pain syndrome.  Patient is currently on Humira 40 mg subcutaneous injections every 14 days, methotrexate 0.6 mL sq injections once weekly, and folic acid 2 mg by mouth daily.  She denies any recent rheumatoid arthritis flares. She denies any joint swelling. She presents today with generalized myalgias, arthralgias, and profound fatigue.  She has been experiencing intermittent muscle spasms and muscle fatigue. She states that she has been having myofascial pain flares since January 2022.  She states that the pain has been constant and severe at times.  She has had increased nocturnal pain and significant hypersensitivity.  She has been taking Cymbalta 60 mg 1 capsule by mouth daily for the past 1 month but has not noticed much improvement since increasing the dose from 30 to 60 mg daily.  She has not been taking any over-the-counter products for pain relief.  She has been using a heating pad with a down blanket.  She has been sleeping for prolonged periods of time but does not wake up feeling rested.  Activities of Daily Living:  Patient reports morning stiffness for all day.  Patient Reports nocturnal pain.  Difficulty dressing/grooming: Reports Difficulty climbing stairs: Reports Difficulty getting out of chair: Reports Difficulty using hands for taps, buttons, cutlery, and/or writing: Reports  Review of Systems  Constitutional: Positive for fatigue.  HENT: Positive for mouth sores, mouth dryness and nose dryness.   Eyes: Positive for pain, itching and dryness.  Respiratory: Positive for shortness of breath and  difficulty breathing. Negative for cough and wheezing.   Cardiovascular: Negative for chest pain and palpitations.  Gastrointestinal: Positive for constipation. Negative for blood in stool and diarrhea.  Endocrine: Positive for cold intolerance. Negative for increased urination.  Genitourinary: Negative for difficulty urinating.  Musculoskeletal: Positive for arthralgias, joint pain, myalgias, muscle weakness, morning stiffness, muscle tenderness and myalgias. Negative for joint swelling.  Skin: Negative for color change, rash and redness.  Allergic/Immunologic: Negative for susceptible to infections.  Neurological: Positive for dizziness, headaches, parasthesias, memory loss and weakness. Negative for numbness.  Hematological: Negative for bruising/bleeding tendency.  Psychiatric/Behavioral: Positive for confusion.    PMFS History:  Patient Active Problem List   Diagnosis Date Noted  . Fibromyalgia 01/07/2020  . HLA B27 (HLA B27 positive) 10/24/2016  . Prediabetes 10/31/2014  . Mixed hyperlipidemia 09/09/2012  . Hypothyroidism 05/25/2012  . Essential hypertension, benign 05/25/2012  . Rheumatoid arthritis involving multiple sites with positive rheumatoid factor ([place]) 05/25/2012  . Obesity (BMI 30-39.9) 05/25/2012    Past Medical History:  Diagnosis Date  . Fibromyalgia 03/2019   put on cymbalta, and pain resolved  . Hyperlipidemia    borderline; dietary management  . Hypertension   . Hypothyroidism 2001  . Rheumatoid arthritis(714.0) 04/2006   Dr. Estanislado Pandy manages    Family History  Problem Relation Age of Onset  . Heart disease Mother 12       MI, stents x 2; CABG age 42  . Stroke Mother 9  . Hyperlipidemia Mother   .  Hypertension Mother   . Depression Mother   . Diabetes Mother   . Thyroid disease Mother        Graves disease, s/p RAI, now hypothyroid  . Irritable bowel syndrome Mother   . COPD Mother   . Colon polyps Mother   . Cancer Mother        skin (not  melanoma)  . Kidney Stones Mother   . Lung cancer Mother   . Stroke Father 19  . Hyperlipidemia Father   . Hypertension Father   . Depression Father   . Diabetes Father   . Thyroid disease Father        hypothyroidism  . COPD Father   . Heart disease Father 57       MI; s/p CABG; pacemaker  . Cancer Sister 32       pancreatic cancer  . Bipolar disorder Sister   . Lupus Paternal Aunt   . Heart disease Maternal Uncle        s/p CABG, late 29's  . Cancer Maternal Grandfather        laryngeal, smoker  . Multiple sclerosis Cousin    Past Surgical History:  Procedure Laterality Date  . 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 PILONIDAL CYST EXCISION  1983  . TONSILLECTOMY  age 29  . WISDOM TOOTH EXTRACTION     Social History   Social History Narrative   Lives with female partner Judson Roch) of 6 years--got married 06/11/13. 3 cats, 1 dog   2 cats 1 dog.   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   Immunization History  Administered Date(s) Administered  . Influenza Inj Mdck Quad Pf 04/11/2019  . Influenza Split 03/10/2012  . Influenza,inj,Quad PF,6+ Mos 04/11/2019  . PFIZER(Purple Top)SARS-COV-2 Vaccination 08/27/2019, 09/10/2019, 01/09/2020  . Pneumococcal Conjugate-13 11/01/2015  . Pneumococcal Polysaccharide-23 07/02/2006, 12/30/2018  . Tdap 09/09/2012     Objective: Vital Signs: BP (!) 140/93 (BP Location: Right Wrist, Patient Position: Sitting, Cuff Size: Normal)   Pulse 62   Resp 17   Ht $R'5\' 7"'Ky$  (1.702 m)   Wt 232 lb 9.6 oz (105.5 kg)   LMP 01/13/2013   BMI 36.43 kg/m    Physical Exam Vitals and nursing note reviewed.  Constitutional:      Appearance: She is well-developed and well-nourished.  HENT:     Head: Normocephalic and atraumatic.  Eyes:     Extraocular Movements: EOM normal.      Conjunctiva/sclera: Conjunctivae normal.  Cardiovascular:     Pulses: Intact distal pulses.  Pulmonary:     Effort: Pulmonary effort is normal.  Abdominal:     Palpations: Abdomen is soft.  Musculoskeletal:     Cervical back: Normal range of motion.  Skin:    General: Skin is warm and dry.     Capillary Refill: Capillary refill takes less than 2 seconds.  Neurological:     Mental Status: She is alert and oriented to person, place, and time.  Psychiatric:        Mood and Affect: Mood and affect normal.        Behavior: Behavior normal.    Musculoskeletal Exam: Generalized hyperalgesia and positive tender points.  She has trapezius muscle tension and muscle tenderness bilaterally.  Limited range of motion of the C-spine especially  with lateral rotation.  Painful range of motion of both shoulders.  Wrist joints, MCPs, PIPs, DIPs have good range of motion with no synovitis.  Trochanteric bursitis of both hips.  Knee joints have good range of motion with no warmth or effusion.  Ankle joints have good range of motion with no inflammation.   CDAI Exam: CDAI Score: 0.2  Patient Global: 1 mm; Provider Global: 1 mm Swollen: 0 ; Tender: 0  Joint Exam 08/07/2020   No joint exam has been documented for this visit   There is currently no information documented on the homunculus. Go to the Rheumatology activity and complete the homunculus joint exam.  Investigation: No additional findings.  Imaging: No results found.  Recent Labs: Lab Results  Component Value Date   WBC 6.0 06/12/2020   HGB 14.6 06/12/2020   PLT 267 06/12/2020   NA 138 06/12/2020   K 4.2 06/12/2020   CL 101 06/12/2020   CO2 28 06/12/2020   GLUCOSE 75 06/12/2020   BUN 19 06/12/2020   CREATININE 0.81 06/12/2020   BILITOT 0.6 06/12/2020   ALKPHOS 70 12/29/2019   AST 17 06/12/2020   ALT 22 06/12/2020   PROT 6.0 (L) 06/12/2020   ALBUMIN 4.2 12/29/2019   CALCIUM 9.6 06/12/2020   GFRAA 94 06/12/2020   QFTBGOLD  NEGATIVE 05/05/2017   QFTBGOLDPLUS NEGATIVE 06/12/2020    Speciality Comments: Prior therapy includes: Enbrel (inadequate response), Plaquenil (inadequate response)   Procedures:  Trigger Point Inj  Date/Time: 08/07/2020 12:13 PM Performed by: Ofilia Neas, PA-C Authorized by: Ofilia Neas, PA-C   Consent Given by:  Patient Site marked: the procedure site was marked   Timeout: prior to procedure the correct patient, procedure, and site was verified   Indications:  Pain Total # of Trigger Points:  2 Location: neck   Needle Size:  27 G Approach:  Dorsal Medications #1:  0.5 mL lidocaine 1 %; 10 mg triamcinolone acetonide 40 MG/ML Medications #2:  0.5 mL lidocaine 1 %; 10 mg triamcinolone acetonide 40 MG/ML Patient tolerance:  Patient tolerated the procedure well with no immediate complications   Allergies: Penicillins   Assessment / Plan:     Visit Diagnoses: Rheumatoid arthritis involving multiple sites with positive rheumatoid factor (HCC) - +RF,+ anti-CCP: She has no synovitis on exam.  She has not had any recent rheumatoid arthritis flares.  She is clinically doing well on Humira 40 mg subcutaneous injections every 14 days, methotrexate 0.6 mL sq injections once weekly, and folic acid 2 mg by mouth daily.  She has not missed any doses recently.  She presents today with generalized myalgias and profound fatigue secondary to myofascial pain syndrome.  She will be referred to integrative therapies.  She will remain on the current treatment regimen for management of rheumatoid arthritis.  She does not need any refills at this time.  She was advised to notify us if she develops increased joint pain or joint swelling.  She will follow up in 1 month.   High risk medication use - Humira 40 mg sq injections every 14 days (Started in February 2020), methotrexate 0.6 ml sq injections every 7 days, and folic acid 2 mg po daily.  CBC and CMP were drawn on 06/12/2020.  TB gold negative on  06/12/2020.  Her next lab work will be due in April and every 3 months to monitor for drug toxicity.  Standing orders for CBC and CMP remain in place. She has not had any  recent infections.   HLA B27 (HLA B27 positive)  Myofascial pain - She presents today with generalized hyperalgesia and positive tender points.  She has been experiencing severe myalgias and muscle tenderness since January 2022.  She has had significant fatigue despite sleeping for about 10 hours per night.  She tried increasing the dose of Cymbalta from 30 mg to 60 mg daily about 1 month ago but has not noticed any improvement in her symptoms.  She has not been taking any over-the-counter products for pain relief.  She has been experiencing intermittent muscle spasms and muscle fatigue.  She has not had any muscle weakness.  We discussed treatment options for managing myofascial pain both acutely and chronically.  She will remain on Cymbalta as prescribed.  A prescription for Robaxin 500 mg twice daily as needed was sent to the pharmacy.  I will also place a referral to integrative therapies which I think she will no significant benefit from.  She declined referral to pain management at this time.  We discussed the importance of regular exercise and good sleep hygiene.  I will also obtain the following lab work today including CK, ESR, SPEP, vitamin B12, and TSH to evaluate for other causes of these myalgias and fatigue. She will follow up in 1 month to reassess how she is doing.  Plan: Ambulatory referral to Physical Therapy  Other fatigue -She has been experiencing significant amount of fatigue over the past 2 months.  She has been unable to be as active as she typically.  We will obtain the following lab work today.  We discussed the importance of regular exercise and good sleep hygiene.  Plan: TSH, CK, Vitamin B12, Sedimentation rate, Serum protein electrophoresis with reflex  Myalgia -She has been experiencing severe myalgias, muscle  fatigue, and intermittent muscle spasms.  She has no muscle weakness at this time.  She has been using a heating pad for symptomatic relief.  She has not been taking any over-the-counter products.  We will obtain the following lab work today to rule out other causes for her myalgia.  She will also be referred to negative therapies.    Plan: TSH, CK, Vitamin B12, Sedimentation rate, Serum protein electrophoresis with reflex, Ambulatory referral to Physical Therapy  Trapezius muscle spasm - She presents today with trapezius muscle tension and tenderness bilaterally.  Trigger point injections were performed today.  She tolerated the procedure well.  Aftercare was discussed. Discussed the importance of proper posture and massage.  She can continue to use a heating pad.  A referral to integrative therapies was also placed today.   Plan: Trigger Point Inj  Morton's neuroma of right foot: She is wearing proper fitting shoes.    Other medical conditions are listed as follows:   Keratitis  History of hypothyroidism  Prediabetes  History of hyperlipidemia  History of hypertension    Orders: Orders Placed This Encounter  Procedures  . Trigger Point Inj  . TSH  . CK  . Vitamin B12  . Sedimentation rate  . Serum protein electrophoresis with reflex  . Ambulatory referral to Physical Therapy   Meds ordered this encounter  Medications  . methocarbamol (ROBAXIN) 500 MG tablet    Sig: Take 1 tablet (500 mg total) by mouth 2 (two) times daily.    Dispense:  60 tablet    Refill:  0      Follow-Up Instructions: Return in 4 weeks (on 09/04/2020) for Rheumatoid arthritis, myofascial pain syndrome.   Lovena Le  Haskel Schroeder, PA-C  Note - This record has been created using [employer].  Chart creation errors have been sought, but may not always  have been located. Such creation errors do not reflect on  the standard of medical care.

## 2020-08-08 NOTE — Progress Notes (Signed)
TSH, vitamin B12, and ESR WNL.  CK is borderline low-28.

## 2020-08-10 LAB — PROTEIN ELECTROPHORESIS, SERUM, WITH REFLEX
Albumin ELP: 3.9 g/dL (ref 3.8–4.8)
Alpha 1: 0.3 g/dL (ref 0.2–0.3)
Alpha 2: 0.6 g/dL (ref 0.5–0.9)
Beta 2: 0.2 g/dL (ref 0.2–0.5)
Beta Globulin: 0.4 g/dL (ref 0.4–0.6)
Gamma Globulin: 0.7 g/dL — ABNORMAL LOW (ref 0.8–1.7)
Total Protein: 6 g/dL — ABNORMAL LOW (ref 6.1–8.1)

## 2020-08-10 LAB — VITAMIN B12: Vitamin B-12: 431 pg/mL (ref 200–1100)

## 2020-08-10 LAB — IFE INTERPRETATION: Immunofix Electr Int: NOT DETECTED

## 2020-08-10 LAB — CK: Total CK: 28 U/L — ABNORMAL LOW (ref 29–143)

## 2020-08-10 LAB — TSH: TSH: 1.67 mIU/L (ref 0.40–4.50)

## 2020-08-10 LAB — SEDIMENTATION RATE: Sed Rate: 2 mm/h (ref 0–30)

## 2020-08-10 NOTE — Progress Notes (Signed)
IFE did not reveal any monoclonal proteins.    Please check to see how the patient is feeling.

## 2020-08-10 NOTE — Progress Notes (Signed)
Please advise the patient to reduce the dose of robaxin and try taking a half tablet as needed.

## 2020-08-22 NOTE — Progress Notes (Signed)
Office Visit Note  [name]             Date of Birth: [birth year]           MRN: [id number]             PCP: Rita Ohara, MD Referring: Rita Ohara, MD Visit Date: 09/05/2020 Occupation: _0 @  Subjective:  Other (Left shoulder pain. Patient also reports increased muscle spasms within the past 3 days. )   History of Present Illness: Jill Mathews is a 55 y.o. female with history of seropositive rheumatoid arthritis and myofascial pain syndrome.  She was seen last on August 07, 2020 by Lovena Le.  According the patient the symptoms a started at the beginning of the year.  She was having a fibromyalgia flare with increased pain and discomfort.  She had bilateral trapezius injections and her Cymbalta dose was increased from 30 to 60 mg a day.  She was also referred to physical therapy.  Previous injections helped with the pain has recurred now.  She has noticed some improvement with Cymbalta.  She continues to have some thoracic pain.  She states a week ago she had ultrasound therapy to her shoulder which was helpful but the repeat treatment a week later because severe pain and discomfort in her left shoulder.  She had massage therapy which helped to some extent.  She is still having discomfort lifting her left arm and rotating her head.  She is been experiencing increased fatigue.  She states her fatigue has been severe enough that she is having difficulty driving.  She is very drowsy when she is driving.  She does not feel safe driving long distance.  She would prefer to work from home.  He is also experiencing memory loss.  Activities of Daily Living:  Patient reports morning stiffness for all day. Patient Reports nocturnal pain.  Difficulty dressing/grooming: Reports Difficulty climbing stairs: Reports Difficulty getting out of chair: Reports Difficulty using hands for taps, buttons, cutlery, and/or writing: Reports  Review of Systems  Constitutional: Positive for  fatigue.  HENT: Positive for mouth sores, mouth dryness and nose dryness.   Eyes: Positive for itching and dryness. Negative for pain.  Respiratory: Negative for shortness of breath and difficulty breathing.   Cardiovascular: Negative for chest pain and palpitations.  Gastrointestinal: Positive for constipation. Negative for blood in stool and diarrhea.  Endocrine: Negative for increased urination.  Genitourinary: Negative for difficulty urinating.  Musculoskeletal: Positive for arthralgias, joint pain, myalgias, morning stiffness, muscle tenderness and myalgias. Negative for joint swelling.  Skin: Negative for color change, rash and redness.  Allergic/Immunologic: Negative for susceptible to infections.  Neurological: Positive for numbness and memory loss. Negative for dizziness and headaches.  Hematological: Positive for bruising/bleeding tendency.  Psychiatric/Behavioral: Positive for confusion.    PMFS History:  Patient Active Problem List   Diagnosis Date Noted  . Fibromyalgia 01/07/2020  . HLA B27 (HLA B27 positive) 10/24/2016  . Prediabetes 10/31/2014  . Mixed hyperlipidemia 09/09/2012  . Hypothyroidism 05/25/2012  . Essential hypertension, benign 05/25/2012  . Rheumatoid arthritis involving multiple sites with positive rheumatoid factor ([place]) 05/25/2012  . Obesity (BMI 30-39.9) 05/25/2012    Past Medical History:  Diagnosis Date  . Fibromyalgia 03/2019   put on cymbalta, and pain resolved  . Hyperlipidemia    borderline; dietary management  . Hypertension   . Hypothyroidism 2001  . Rheumatoid arthritis(714.0) 04/2006   Dr. Estanislado Pandy manages    Family History  Problem Relation Age  of Onset  . Heart disease Mother 21       MI, stents x 2; CABG age 42  . Stroke Mother 97  . Hyperlipidemia Mother   . Hypertension Mother   . Depression Mother   . Diabetes Mother   . Thyroid disease Mother        Graves disease, s/p RAI, now hypothyroid  . Irritable bowel syndrome  Mother   . COPD Mother   . Colon polyps Mother   . Cancer Mother        skin (not melanoma)  . Kidney Stones Mother   . Lung cancer Mother   . Stroke Father 89  . Hyperlipidemia Father   . Hypertension Father   . Depression Father   . Diabetes Father   . Thyroid disease Father        hypothyroidism  . COPD Father   . Heart disease Father 25       MI; s/p CABG; pacemaker  . Cancer Sister 63       pancreatic cancer  . Bipolar disorder Sister   . Lupus Paternal Aunt   . Heart disease Maternal Uncle        s/p CABG, late 31's  . Cancer Maternal Grandfather        laryngeal, smoker  . Multiple sclerosis Cousin    Past Surgical History:  Procedure Laterality Date  . 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 PILONIDAL CYST EXCISION  1983  . TONSILLECTOMY  age 78  . WISDOM TOOTH EXTRACTION     Social History   Social History Narrative   Lives with female partner Judson Roch) of 54 years--got married 06/11/13. 3 cats, 1 dog   2 cats 1 dog.   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   Immunization History  Administered Date(s) Administered  . Influenza Inj Mdck Quad Pf 04/11/2019  . Influenza Split 03/10/2012  . Influenza,inj,Quad PF,6+ Mos 04/11/2019  . PFIZER(Purple Top)SARS-COV-2 Vaccination 08/27/2019, 09/10/2019, 01/09/2020  . Pneumococcal Conjugate-13 11/01/2015  . Pneumococcal Polysaccharide-23 07/02/2006, 12/30/2018  . Tdap 09/09/2012     Objective: Vital Signs: BP 132/85 (BP Location: Right Arm, Patient Position: Sitting, Cuff Size: Large)   Pulse 71   Resp 16   Ht _0  (1.702 m)   Wt 233 lb 6.4 oz (105.9 kg)   LMP 01/13/2013   BMI 36.56 kg/m    Physical Exam Vitals and nursing note reviewed.  Constitutional:      Appearance: She is well-developed.  HENT:     Head: Normocephalic and  atraumatic.  Eyes:     Conjunctiva/sclera: Conjunctivae normal.  Cardiovascular:     Rate and Rhythm: Normal rate and regular rhythm.     Heart sounds: Normal heart sounds.  Pulmonary:     Effort: Pulmonary effort is normal.     Breath sounds: Normal breath sounds.  Abdominal:     General: Bowel sounds are normal.     Palpations: Abdomen is soft.  Musculoskeletal:     Cervical back: Normal range of motion.  Lymphadenopathy:     Cervical: No cervical adenopathy.  Skin:    General: Skin is warm and dry.     Capillary Refill: Capillary refill takes less than 2 seconds.  Neurological:     Mental Status: She is alert  and oriented to person, place, and time.  Psychiatric:        Behavior: Behavior normal.      Musculoskeletal Exam: She had limited range of motion of her cervical spine with tightness in her trapezius region.  Right shoulder joint was in full range of motion.  She had discomfort range of motion of her left shoulder joint was tenderness over left bicipital tendon.  Elbow joints, wrist joints, MCPs PIPs and DIPs with good range of motion with no synovitis.  Hip joints, knee joints with good range of motion with no synovitis.  She had no tenderness over ankles or MTPs.  She had hyperalgesia.  CDAI Exam: CDAI Score: 0.4  Patient Global: 2 mm; Provider Global: 2 mm Swollen: 0 ; Tender: 0  Joint Exam 09/05/2020   No joint exam has been documented for this visit   There is currently no information documented on the homunculus. Go to the Rheumatology activity and complete the homunculus joint exam.  Investigation: No additional findings.  Imaging: No results found.  Recent Labs: Lab Results  Component Value Date   WBC 6.0 06/12/2020   HGB 14.6 06/12/2020   PLT 267 06/12/2020   NA 138 06/12/2020   K 4.2 06/12/2020   CL 101 06/12/2020   CO2 28 06/12/2020   GLUCOSE 75 06/12/2020   BUN 19 06/12/2020   CREATININE 0.81 06/12/2020   BILITOT 0.6 06/12/2020   ALKPHOS  70 12/29/2019   AST 17 06/12/2020   ALT 22 06/12/2020   PROT 6.0 (L) 08/07/2020   ALBUMIN 4.2 12/29/2019   CALCIUM 9.6 06/12/2020   GFRAA 94 06/12/2020   QFTBGOLD NEGATIVE 05/05/2017   QFTBGOLDPLUS NEGATIVE 06/12/2020    Speciality Comments: Prior therapy includes: Enbrel (inadequate response), Plaquenil (inadequate response)   Procedures:  No procedures performed Allergies: Penicillins   Assessment / Plan:     Visit Diagnoses: Rheumatoid arthritis involving multiple sites with positive rheumatoid factor (HCC) - +RF,+ anti-CCP: She continues to have pain in muscles and joints.  No synovitis was noted.  I believe her rheumatoid arthritis is well controlled.  High risk medication use - Humira 40 mg sq injections every 14 days (Started in February 2020), methotrexate 0.6 ml sq injections every 7 days, and folic acid 2 mg po daily.  I will obtain  labs today to monitor for drug toxicity.  TB Gold was negative in January 2022..- Plan: CBC with Differential/Platelet, COMPLETE METABOLIC PANEL WITH GFR  HLA B27 (HLA B27 positive)  Bicipital tendinitis, left shoulder-she has been having discomfort over the left bicipital tendon.  Handout was given.  I have advised her to use Voltaren gel.  She is also going to physical therapy.  Myofascial pain -she has been experiencing a flare of fibromyalgia with generalized pain and discomfort.  She has noticed improvement with increased dose of Cymbalta.  She still has significant fatigue and discomfort.  Need for regular exercise was emphasized.  She would benefit from water aerobics.  Cymbalta 80m by mouth daily, Robaxin 500 mg twice daily as needed   Trapezius muscle spasm-she had good response to bilateral trapezius injection.  Although this tightness and discomfort has recurred.  Hypersomnolence-she is complains of excessive daytime sleepiness.  She believes is secondary to nocturnal pain and fatigue and insomnia.  She is concerned about driving to  work.  She would prefer to work from home.  She is thinking of applying for short-term disability.  Other fatigue-she complains of increased fatigue.  I  am concerned that she may have underlying sleep apnea.  I will refer her to neurology for evaluation.  Memory loss-she also complains of increased memory loss.  Will make neurology referral.  The medical problems are listed as follows:  Keratitis-no recent flare.  History of hyperlipidemia-increased risk of heart disease with autoimmune disease was discussed.  A handout on dietary management and exercise was placed in the AVS.  Prediabetes  History of hypertension  History of hypothyroidism-TSH was normal in February.  Orders: Orders Placed This Encounter  Procedures  . CBC with Differential/Platelet  . COMPLETE METABOLIC PANEL WITH GFR  . Ambulatory referral to Neurology   No orders of the defined types were placed in this encounter.     Follow-Up Instructions: Return in about 3 months (around 12/06/2020) for Rheumatoid arthritis.   Bo Merino, MD  Note - This record has been created using Editor, commissioning.  Chart creation errors have been sought, but may not always  have been located. Such creation errors do not reflect on  the standard of medical care.

## 2020-09-05 ENCOUNTER — Encounter: Payer: Self-pay | Admitting: Rheumatology

## 2020-09-05 ENCOUNTER — Other Ambulatory Visit: Payer: Self-pay | Admitting: Rheumatology

## 2020-09-05 ENCOUNTER — Other Ambulatory Visit: Payer: Self-pay

## 2020-09-05 ENCOUNTER — Ambulatory Visit: Payer: BC Managed Care – PPO | Admitting: Rheumatology

## 2020-09-05 VITALS — BP 132/85 | HR 71 | Resp 16 | Ht 67.0 in | Wt 233.4 lb

## 2020-09-05 DIAGNOSIS — Z79899 Other long term (current) drug therapy: Secondary | ICD-10-CM

## 2020-09-05 DIAGNOSIS — H169 Unspecified keratitis: Secondary | ICD-10-CM

## 2020-09-05 DIAGNOSIS — M7522 Bicipital tendinitis, left shoulder: Secondary | ICD-10-CM

## 2020-09-05 DIAGNOSIS — M0579 Rheumatoid arthritis with rheumatoid factor of multiple sites without organ or systems involvement: Secondary | ICD-10-CM

## 2020-09-05 DIAGNOSIS — M791 Myalgia, unspecified site: Secondary | ICD-10-CM

## 2020-09-05 DIAGNOSIS — M62838 Other muscle spasm: Secondary | ICD-10-CM

## 2020-09-05 DIAGNOSIS — G4710 Hypersomnia, unspecified: Secondary | ICD-10-CM

## 2020-09-05 DIAGNOSIS — R5383 Other fatigue: Secondary | ICD-10-CM

## 2020-09-05 DIAGNOSIS — R7303 Prediabetes: Secondary | ICD-10-CM

## 2020-09-05 DIAGNOSIS — G5761 Lesion of plantar nerve, right lower limb: Secondary | ICD-10-CM

## 2020-09-05 DIAGNOSIS — Z1589 Genetic susceptibility to other disease: Secondary | ICD-10-CM

## 2020-09-05 DIAGNOSIS — Z8679 Personal history of other diseases of the circulatory system: Secondary | ICD-10-CM

## 2020-09-05 DIAGNOSIS — Z8639 Personal history of other endocrine, nutritional and metabolic disease: Secondary | ICD-10-CM

## 2020-09-05 DIAGNOSIS — R413 Other amnesia: Secondary | ICD-10-CM

## 2020-09-05 DIAGNOSIS — M7918 Myalgia, other site: Secondary | ICD-10-CM

## 2020-09-05 LAB — CBC WITH DIFFERENTIAL/PLATELET
Absolute Monocytes: 746 cells/uL (ref 200–950)
Basophils Absolute: 49 cells/uL (ref 0–200)
Basophils Relative: 0.6 %
Eosinophils Absolute: 139 cells/uL (ref 15–500)
Eosinophils Relative: 1.7 %
HCT: 45.3 % — ABNORMAL HIGH (ref 35.0–45.0)
Hemoglobin: 14.9 g/dL (ref 11.7–15.5)
Lymphs Abs: 2296 cells/uL (ref 850–3900)
MCH: 31.5 pg (ref 27.0–33.0)
MCHC: 32.9 g/dL (ref 32.0–36.0)
MCV: 95.8 fL (ref 80.0–100.0)
MPV: 10.6 fL (ref 7.5–12.5)
Monocytes Relative: 9.1 %
Neutro Abs: 4969 cells/uL (ref 1500–7800)
Neutrophils Relative %: 60.6 %
Platelets: 289 10*3/uL (ref 140–400)
RBC: 4.73 10*6/uL (ref 3.80–5.10)
RDW: 12.4 % (ref 11.0–15.0)
Total Lymphocyte: 28 %
WBC: 8.2 10*3/uL (ref 3.8–10.8)

## 2020-09-05 LAB — COMPLETE METABOLIC PANEL WITH GFR
AG Ratio: 2.1 (calc) (ref 1.0–2.5)
ALT: 19 U/L (ref 6–29)
AST: 15 U/L (ref 10–35)
Albumin: 4.5 g/dL (ref 3.6–5.1)
Alkaline phosphatase (APISO): 61 U/L (ref 37–153)
BUN: 19 mg/dL (ref 7–25)
CO2: 31 mmol/L (ref 20–32)
Calcium: 9.7 mg/dL (ref 8.6–10.4)
Chloride: 104 mmol/L (ref 98–110)
Creat: 0.9 mg/dL (ref 0.50–1.05)
GFR, Est African American: 83 mL/min/{1.73_m2} (ref >=60)
GFR, Est Non African American: 71 mL/min/{1.73_m2} (ref >=60)
Globulin: 2.1 g/dL (calc) (ref 1.9–3.7)
Glucose, Bld: 94 mg/dL (ref 65–99)
Potassium: 4.3 mmol/L (ref 3.5–5.3)
Sodium: 140 mmol/L (ref 135–146)
Total Bilirubin: 0.4 mg/dL (ref 0.2–1.2)
Total Protein: 6.6 g/dL (ref 6.1–8.1)

## 2020-09-05 NOTE — Telephone Encounter (Signed)
Next Visit: 7/45/2022  Last Visit: 3/292/2022  Last Fill: 02/08/2020  DX: Rheumatoid arthritis involving multiple sites with positive rheumatoid factor  Current Dose per office note 09/05/2020, methotrexate 0.6 ml sq injections every 7 days  Labs:  Pending today and 06/12/2020, Total protein borderline low-6.0. rest of CMP WNL. CBC WNL.   Okay to refill MTX?

## 2020-09-05 NOTE — Telephone Encounter (Signed)
I reviewed PDMP.  Patient received hydrocodone few days back.  Please check if there is a documentation of patient notifying us regarding getting hydrocodone from other provider.  We cannot fill tramadol without UDS.  She may be able to get further refills of tramadol from her oncologist and get UDS at their office as it may be convenient for her.

## 2020-09-05 NOTE — Patient Instructions (Addendum)
Proximal Biceps Tendinitis and Tenosynovitis  The proximal biceps tendon is a strong cord of tissue that connects the biceps muscle on the front of the upper arm to the shoulder blade. Tendinitis is inflammation of a tendon. Tenosynovitis is inflammation of the lining around the tendon (tendon sheath). These conditions often occur at the same time, and they can interfere with the ability to bend the elbow and turn the palm of the hand up. Proximal biceps tendinitis and tenosynovitis are usually caused by overusing the shoulder joint and the biceps muscle. These conditions usually heal within 6 weeks. Proximal biceps tendinitis may include a grade 1 or grade 2 strain of the tendon.  A grade 1 strain is mild, and it involves a slight pull of the tendon without any stretching or noticeable tearing of the tendon. There is usually no loss of biceps muscle strength.  A grade 2 strain is moderate, and it involves a small tear in the tendon. The tendon is stretched, and biceps strength is usually decreased. What are the causes? This condition may be caused by:  A sudden increase in frequency or intensity of activity that involves the shoulder and the biceps muscle.  Overuse of the biceps muscle. This can happen when you do the same movements over and over, such as: ? Turning the palm of the hand up. ? Forceful straightening (hyperextension) of the elbow. ? Bending the elbow.  A direct, forceful hit or injury to the elbow. This is rare. What increases the risk? The following factors may make you more likely to develop this condition:  Playing contact sports.  Playing sports that involve throwing and overhead movements, including racket sports, gymnastics, weight lifting, or bodybuilding.  Doing physical labor.  Having poor strength and flexibility of the arm and shoulder. What are the signs or symptoms? Symptoms of this condition may include:  Pain and inflammation in the front of the  shoulder.  A feeling of warmth in the front of the shoulder.  Limited range of motion of the shoulder and the elbow.  A crackling sound (crepitation) when you move or touch the shoulder or the upper arm. In some cases, symptoms may return after treatment, and they may be long-lasting (chronic). How is this diagnosed? This condition is diagnosed based on:  Your symptoms.  Your medical history.  Physical exam.  X-ray or MRI, if needed. How is this treated? Treatment for this condition depends on the severity of your injury. It may include:  Resting the injured arm.  Icing the injured area.  Doing physical therapy. Your health care provider may also use:  Medicines to treat pain and inflammation.  Sound waves to treat the injured muscle (ultrasound therapy).  Medicines that are injected to the muscle (corticosteroids).  Medicines that numb the area (local anesthetics).  Surgery. This is done if other treatments have not worked. Follow these instructions at home: Managing pain, stiffness, and swelling  If directed, put ice on the injured area. ? Put ice in a plastic bag. ? Place a towel between your skin and the bag. ? Leave the ice on for 20 minutes, 2-3 times a day.  If directed, apply heat to the affected area before you exercise. Use the heat source that your health care provider recommends, such as a moist heat pack or a heating pad. ? Place a towel between your skin and the heat source. ? Leave the heat on for 20-30 minutes. ? Remove the heat if your skin turns bright  red. This is especially important if you are unable to feel pain, heat, or cold. You may have a greater risk of getting burned.  Move your fingers often to reduce stiffness and swelling.  Raise (elevate) the injured area above the level of your heart while you are lying down.      Activity  Do not lift anything that is heavier than 10 lb (4.5 kg), or the limit that you are told, until your  health care provider says that it is safe.  Avoid activities that cause pain or make your condition worse.  Return to your normal activities as told by your health care provider. Ask your health care provider what activities are safe for you.  Do exercises as told by your health care provider. General instructions  Take over-the-counter and prescription medicines only as told by your health care provider.  Do not use any products that contain nicotine or tobacco, such as cigarettes, e-cigarettes, and chewing tobacco. These can delay healing. If you need help quitting, ask your health care provider.  Keep all follow-up visits as told by your health care provider. This is important. How is this prevented?  Warm up and stretch before being active.  Cool down and stretch after being active.  Give your body time to rest between periods of activity.  Make sure any equipment that you use is fitted to you.  Be safe and responsible while being active to avoid falls.  Maintain physical fitness, including: ? Strength. ? Flexibility. ? Heart health (cardiovascular fitness). ? The ability to use muscles for a long time (endurance). Contact a health care provider if:  You have symptoms that get worse or do not get better after 2 weeks of treatment.  You develop new symptoms. Get help right away if:  You develop severe pain. Summary  Tendinitis is inflammation of the biceps tendon. Tenosynovitis is inflammation of the lining around the biceps tendon. These conditions often occur at the same time.  These conditions are usually caused by overusing the shoulder joint and biceps muscle.  Symptoms include pain, warmth in the shoulder, and limited range of motion.  The two conditions are treated with rest, ice, medicines, and surgery (rare). This information is not intended to replace advice given to you by your health care provider. Make sure you discuss any questions you have with your  health care provider. Document Revised: 09/22/2018 Document Reviewed: 07/23/2018 Elsevier Patient Education  2021 [employer]   Standing Labs We placed an order today for your standing lab work.   Please have your standing labs drawn in July and every 3 months  If possible, please have your labs drawn 2 weeks prior to your appointment so that the provider can discuss your results at your appointment.  We have open lab daily Monday through Thursday from 1:30-4:30 PM and Friday from 1:30-4:00 PM at the office of Dr. Pollyann Savoy, [facility].   Please be advised, all patients with office appointments requiring lab work will take precedents over walk-in lab work.  If possible, please come for your lab work on Monday and Friday afternoons, as you may experience shorter wait times. The office is located at [address], [address], [place], [place] [address] No appointment is necessary.   Labs are drawn by [facility]. Please bring your co-pay at the time of your lab draw.  You may receive a bill from [facility] for your lab work.  If you wish to have your labs drawn  at another location, please call the office 24 hours in advance to send orders.  If you have any questions regarding directions or hours of operation,  please call [phone].   As a reminder, please drink plenty of water prior to coming for your lab work. Thanks!   COVID-19 vaccine recommendations:   COVID-19 vaccine is recommended for everyone (unless you are allergic to a vaccine component), even if you are on a medication that suppresses your immune system.   If you are on Methotrexate, Cellcept (mycophenolate), Rinvoq, Harriette Ohara, and Olumiant- hold the medication for 1 week after each vaccine. Hold Methotrexate for 2 weeks after the single dose COVID-19 vaccine.   Do not take Tylenol or any anti-inflammatory medications (NSAIDs) 24 hours prior to the COVID-19 vaccination.   There is no direct evidence about  the efficacy of the COVID-19 vaccine in individuals who are on medications that suppress the immune system.   Even if you are fully vaccinated, and you are on any medications that suppress your immune system, please continue to wear a mask, maintain at least six feet social distance and practice hand hygiene.   If you develop a COVID-19 infection, please contact your PCP or our office to determine if you need monoclonal antibody infusion.  The booster vaccine is now available for immunocompromised patients.   Please see the following web sites for updated information.   [email or web address]-[email or web address]  Vaccines You are taking a medication(s) that can suppress your immune system.  The following immunizations are recommended: . Flu annually . Covid-19  . Pneumonia (Pneumovax 23 and Prevnar 13 spaced at least 1 year apart) . Shingrix (after age 69)  Please check with your PCP to make sure you are up to date.   Heart Disease Prevention   Your inflammatory disease increases your risk of heart disease which includes heart attack, stroke, atrial fibrillation (irregular heartbeats), high blood pressure, heart failure and atherosclerosis (plaque in the arteries).  It is important to reduce your risk by:   . Keep blood pressure, cholesterol, and blood sugar at healthy levels   . Smoking Cessation   . Maintain a healthy weight  o BMI 20-25   . Eat a healthy diet  o Plenty of fresh fruit, vegetables, and whole grains  o Limit saturated fats, foods high in sodium, and added sugars  o DASH and Mediterranean diet   . Increase physical activity  o Recommend moderate physically activity for 150 minutes per week/ 30 minutes a day for five days a week These can be broken up into three separate ten-minute sessions during the day.   . Reduce Stress  . Meditation, slow breathing exercises, yoga, coloring books  . Dental visits twice a year

## 2020-09-06 NOTE — Progress Notes (Signed)
Chief Complaint  Patient presents with  . Consult    Has had a fibro flare x 4 months. Been able to work virtually through the middle of May (end of semester). But not past that. She feels like she will feel better in the summer. She is doing PT and taking her meds religiously. Her concern is when the fall comes and the weather changes(cold/rain) she will have to burn up her  sick leave and once that is gone-it is unpaid leave after that. Needs information from you about long term disablilty.    . Depression screening PHQ2-(6)    [id number])   Patient is under the care of rheum for treatment of RA and fibromyalgia. She has been having flare of fibromyalgia pain since the beginning of the year.  Cymbalta had been started 30mg  03/2019, and dose increased to 60mg  in January.She was missing a lot of work due to pain, fatigue (and fibro-fog--"couldn't even spell my name" lots of days). She states she spent Jan and Feb under a down blanket with a heating pad. Heat provides temporary help.  She described feeling "squeezed, like in a wetsuit 4 sizes too small."  Rheum referred her to [facility] last month. This is supposed to be 3x/week, and has been able to go 2-3x/week.  She couldn't tolerate dry needling.  They are doing ultrasound therapy, massage, KT tape. Is not getting any aqua therapy (but looking into to affordable options for this).  Patient reports fatigue, unrefreshed sleep, daytime somnolence, and some memory concerns. She has been very sleepy while driving.  She had been allowed to finish the semester online, but they will not allow that again in the future.  Even working from home has been exceptionally challenging, given her level of fatigue. She has been having trouble keeping up, has forgotten to grade things.  Dr. Feb referred to neuro for evaluation of memory and OSA. Patient doubts sleep apnea, since prior to fibro flare she didn't have any of these issues. Wife doesn't  report any apnea.  Mild snoring when laying on her back (the position she lays in to fall asleep).  Fatigue was noted prior to increased cymbalta dose. No change in pain since the dose was increased. Recent TSH was normal, and is compliant with her Synthroid. Lab Results  Component Value Date   TSH 1.67 08/07/2020   Corliss Skains (PA at rheum office) wrote her out for the rest of the semester after missing a lot of work. She was allowed to finish the semester online, (asynchronous). She was told she wouldn't be able to do that in the future.  She has questions about long-term disability, mainly related to concerns for next year. She tends to do worse in the winter.  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  Contributed by pain and fatigue, she is having depressive symptoms--see PHQ-9 in Epic.   PMH, PSH, SH reviewed   Outpatient Encounter Medications as of 09/07/2020  Medication Sig  . Adalimumab (HUMIRA PEN) 40 MG/0.4ML PNKT INJECT 1 PEN UNDER THE SKIN EVERY 14 DAYS.  07-06-1972 aspirin 81 MG tablet Take 81 mg by mouth every other day.   . benazepril (LOTENSIN) 10 MG tablet Take 1 tablet (10 mg total) by mouth daily.  . Calcium-Magnesium-Vitamin D (CALCIUM MAGNESIUM  PO) Take by mouth 2 (two) times a day. 1/2 pill every 3 days  . DULoxetine (CYMBALTA) 60 MG capsule Take 1 capsule (60 mg total) by mouth daily.  . fish oil-omega-3 fatty acids 1000 MG capsule Take 1 g by mouth 2 (two) times daily.   . folic acid (FOLVITE) 1 MG tablet Take 2 tablets (2 mg total) by mouth daily.  . hydrochlorothiazide (HYDRODIURIL) 25 MG tablet Take 1 tablet (25 mg total) by mouth daily.  Marland Kitchen loratadine (CLARITIN) 10 MG tablet Take 10 mg by mouth as needed for allergies.  . methocarbamol (ROBAXIN) 500 MG tablet Take  1 tablet (500 mg total) by mouth 2 (two) times daily.  . methotrexate 50 MG/2ML injection INJECT 0.6 ML UNDER SKIN ONCE WEEKLY  . Multiple Vitamins-Minerals (MULTIVITAMIN ADULT PO) Take by mouth daily.  Marland Kitchen SYNTHROID 137 MCG tablet TAKE 1 TABLET(137 MCG) BY MOUTH DAILY  . MELATONIN PO Take by mouth as needed. (Patient not taking: Reported on 09/07/2020)  . [DISCONTINUED] DULoxetine (CYMBALTA) 30 MG capsule Take 30 mg by mouth daily.  . [DISCONTINUED] MAGNESIUM PO Take by mouth daily.   No facility-administered encounter medications on file as of 09/07/2020.   Allergies  Allergen Reactions  . Penicillins Rash   ROS: no fever, chills, URI symptoms.  +fibro pain (back, shoulders, everywhere), fatigue, concentration difficulty. No changes to hair/skin/bowels.  Depressed mood.  See HPI   PHYSICAL EXAM:  BP 138/80   Pulse 84   Ht 5\' 7"  (1.702 m)   Wt 234 lb 6.4 oz (106.3 kg)   LMP 01/13/2013   BMI 36.71 kg/m   Wt Readings from Last 3 Encounters:  09/07/20 234 lb 6.4 oz (106.3 kg)  09/05/20 233 lb 6.4 oz (105.9 kg)  08/07/20 232 lb 9.6 oz (105.5 kg)   Fatigued, somewhat slowed mentation. She is alert, but appears tired. She has full range of affect, but depressed mood. Normal hygiene and grooming. Normal gait  PHQ-9 score of 23   ASSESSMENT/PLAN:  Fibromyalgia - in the midst of a prolonged flare. 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 Encouraged counseling. Will consult with Dr. 08/09/20 regarding further increase cymbalta to 90mg   Hypothyroidism, unspecified type - adequately replaced with current Synthroid dose, continue  Rheumatoid arthritis involving multiple sites with positive rheumatoid factor (HCC) - doing well on Humira through rheum  Fatigue, unspecified type - likely related to fibro flare; was referred to neuro for eval for OSA/narcolepsy  We discussed increasing cymbalta dose to 90mg --but I'd touch base with Dr.  Algis Downs first.  Discussed possibility of Lyrica or gabapentin for pain, but would potentially be sedating, and ultimately will leave this up to rheum. Encouraged her to get counseling for depression, the link and risks related to chronic pain.  Discussed disability--she would need to look into her policy.  If getting permanent disability, they have their own people who do the evaluation, we provide records. If the disability is related to a condition the rheumatologist is treating, info regarding restrictions and ability to work should come from them.  Tried to answer her questions and concerns to the best of my ability. This is mainly related to concerns about next winter, NOT needing any disability currently.  We discussed sx of OSA, all of which she has--the history that she has none of these symptoms unrelated to pain flare suggests this is related to fibro.  I support her getting sleep study--discussed that if  neuro appt is far out, we likely can do sleep study sooner.  I spent 55 minutes dedicated to the care of this patient, including pre-visit review of records, face to face time, post-visit ordering of testing and documentation.   Touch base with Dr. Corliss Skains re: increasing cymbalta Lyrica or gabapentin possibly  Let us know if the appointment with Dr. Vickey Huger is far out--we may be able to order the sleep study itself earlier.  I am going to reach to you rheumatologist about possibly increasing the Cymbalta dose.  I encourage you to look into therapy through your Employee Assistance Program (or through your insurance).  Check with your disability policy/company regarding what the steps are.  We would need to look at the forms, to see who best could help complete them.  (I usually have the specialists treating the problem fill these forms out).

## 2020-09-06 NOTE — Telephone Encounter (Signed)
I don't see where you prescribe Tramadol to this patient. Please advise. Thank you.

## 2020-09-07 ENCOUNTER — Encounter: Payer: Self-pay | Admitting: Family Medicine

## 2020-09-07 ENCOUNTER — Ambulatory Visit: Payer: BC Managed Care – PPO | Admitting: Family Medicine

## 2020-09-07 ENCOUNTER — Other Ambulatory Visit: Payer: Self-pay

## 2020-09-07 VITALS — BP 138/80 | HR 84 | Ht 67.0 in | Wt 234.4 lb

## 2020-09-07 DIAGNOSIS — F329 Major depressive disorder, single episode, unspecified: Secondary | ICD-10-CM

## 2020-09-07 DIAGNOSIS — E039 Hypothyroidism, unspecified: Secondary | ICD-10-CM | POA: Diagnosis not present

## 2020-09-07 DIAGNOSIS — M0579 Rheumatoid arthritis with rheumatoid factor of multiple sites without organ or systems involvement: Secondary | ICD-10-CM | POA: Diagnosis not present

## 2020-09-07 DIAGNOSIS — R5383 Other fatigue: Secondary | ICD-10-CM

## 2020-09-07 DIAGNOSIS — M797 Fibromyalgia: Secondary | ICD-10-CM | POA: Diagnosis not present

## 2020-09-07 NOTE — Patient Instructions (Addendum)
Check with [facility] regarding water therapy for fibromyalgia. You can also look into whether or not the [employer] will allow you to take their class without a full membership (and pay).  Let us know if the appointment with Dr. Vickey Huger is far out--we may be able to order the sleep study itself earlier.  I am going to reach to you rheumatologist about possibly increasing the Cymbalta dose.  I encourage you to look into therapy through your Employee Assistance Program (or through your insurance).  Check with your disability policy/company regarding what the steps are.  We would need to look at the forms, to see who best could help complete them.  (I usually have the specialists treating the problem fill these forms out).

## 2020-09-11 ENCOUNTER — Other Ambulatory Visit: Payer: Self-pay | Admitting: Physician Assistant

## 2020-09-11 DIAGNOSIS — M0579 Rheumatoid arthritis with rheumatoid factor of multiple sites without organ or systems involvement: Secondary | ICD-10-CM

## 2020-09-11 NOTE — Telephone Encounter (Signed)
Next Visit: 12/12/2020  Last Visit: 09/05/2020  Last Fill: 06/12/2020  DX: Rheumatoid arthritis involving multiple sites with positive rheumatoid factor   Current Dose per office note 09/05/2020, Humira 40 mg sq injections every 14 days   Labs: 09/05/2020, CBC and CMP WNL  TB Gold: 06/12/2020, negative  Okay to refill Humira?

## 2020-10-09 ENCOUNTER — Other Ambulatory Visit: Payer: Self-pay | Admitting: Physician Assistant

## 2020-10-09 NOTE — Telephone Encounter (Signed)
Next Visit: 12/12/2020  Last Visit: 09/05/2020  Last Fill: 07/14/2020  Dx: Rheumatoid arthritis involving multiple sites with positive rheumatoid factor   Current Dose per office note on 09/05/2020, Cymbalta 60mg  by mouth daily  Okay to refill Cymbalta?

## 2020-10-24 ENCOUNTER — Ambulatory Visit: Payer: BC Managed Care – PPO | Admitting: Neurology

## 2020-10-24 ENCOUNTER — Encounter: Payer: Self-pay | Admitting: Neurology

## 2020-10-24 ENCOUNTER — Other Ambulatory Visit: Payer: Self-pay

## 2020-10-24 VITALS — BP 132/90 | HR 78 | Ht 67.0 in | Wt 233.0 lb

## 2020-10-24 DIAGNOSIS — G4714 Hypersomnia due to medical condition: Secondary | ICD-10-CM | POA: Diagnosis not present

## 2020-10-24 DIAGNOSIS — E6609 Other obesity due to excess calories: Secondary | ICD-10-CM

## 2020-10-24 DIAGNOSIS — M797 Fibromyalgia: Secondary | ICD-10-CM | POA: Diagnosis not present

## 2020-10-24 DIAGNOSIS — Z6836 Body mass index (BMI) 36.0-36.9, adult: Secondary | ICD-10-CM

## 2020-10-24 DIAGNOSIS — R5382 Chronic fatigue, unspecified: Secondary | ICD-10-CM | POA: Diagnosis not present

## 2020-10-24 MED ORDER — MODAFINIL 200 MG PO TABS: 200.0000 mg | ORAL_TABLET | Freq: Every day | ORAL | 2 refills | Status: DC

## 2020-10-24 NOTE — Addendum Note (Signed)
Addended by: Melvyn Novas on: 10/24/2020 09:50 AM   Modules accepted: Orders

## 2020-10-24 NOTE — Progress Notes (Addendum)
SLEEP MEDICINE CLINIC    Provider:  Larey Seat, MD  Primary Care Physician:  Rita Ohara, [place] [address]     Referring Provider:  Dr Estanislado Pandy, MD          Chief Complaint according to patient   Patient presents with:    . New Patient (Initial Visit)     pt alone, pt presents today because she had told her PCP that when she has a flare up of her fibromyalgia and RA she has difficulty with extreme fatigue which causes her to fall asleep. Overall she states she avg 9/10 hrs of sleep waking up 1x to void. She states most days she has good energy with no concerns.  On rainy days/bad weather or when she flares up almost always she is so fatigued it is hard to do anything. Never had a SS. Her wife stated to her that she does not snore.       HISTORY OF PRESENT ILLNESS:  Jill Mathews is a 55 year- old  Caucasian female patient and is  seen here upon referral by rheumatology on 10/24/2020  for an evaluation of severe fatigue.   Chief concern according to patient :     I have the pleasure of seeing Jill Mathews today, a right -handed Caucasian female with a possible sleep disorder.  She   has a past medical history of  HLA B 27 positive Fibromyalgia (03/2019), Hyperlipidemia, Hypertension, Hypothyroidism 08-27-1999), and Rheumatoid arthritis(714.0) (04/2006).   Sleep relevant medical history: severe fatigue, achiness,  Sleepiness while driving-  During flair -ups of fibromyalgia.  Tonsillectomy in childhood.     Family medical /sleep history: No other family member on CPAP with OSA, no insomnia, no sleep walkers.    Social history:  Patient is working as a Teacher, adult education at [employer]-  and lives in a household with her spouse, 2 cats and one dog.   The patient currently works.  Tobacco use; exposed to second hand smoke, due to her mother- she is the remaining care giver after her sister died in 08/26/08 of pancreatic cancer.  ETOH use,  Caffeine intake in form of Coffee( 1-2 cups in AM ) . Regular exercise in form of walking, swimming, fishing, PT .     Sleep habits are as follows: The patient's dinner time is between 6.30- PM. The patient goes to bed at 8.30PM and continues to sleep for 9 hours, wakes for one- two bathroom breaks. The preferred sleep position is lateral , with the support of 1-2 pillows.   Reality like dreams are reportedly  Frequent/vivid until she started on CYMBALTA>  5.30  AM is the usual rise time. The patient wakes up with an alarm.  She reports not feeling refreshed or restored in AM, with symptoms such as stiffness, joint pain,  and residual fatigue. Naps are taken infrequently,  Only when having a flair- 4-5.   Review of Systems: Out of a complete 14 system review, the patient complains of only the following symptoms, and all other reviewed systems are negative.:  Fatigue, sleepiness   How likely are you to doze in the following situations: 0 = not likely, 1 = slight chance, 2 = moderate chance, 3 = high chance   Sitting and Reading? Watching Television? Sitting inactive in a public place (theater or meeting)? As a passenger in a car for an hour without a break? Lying down in the afternoon  when circumstances permit? Sitting and talking to someone? Sitting quietly after lunch without alcohol? In a car, while stopped for a few minutes in traffic?   Total = 3/ 24 points - but 18/ 24 when in a flair up !!  FSS endorsed at 61/ 63 points.   Social History   Socioeconomic History  . Marital status: Married    Spouse name: Not on file  . Number of children: Not on file  . Years of education: Not on file  . Highest education level: Not on file  Occupational History  . Occupation: teaches biology/ecology at [employer]    Employer: [employer]  Tobacco Use  . Smoking status: Former Smoker    Packs/day: 1.50    Years: 26.00    Pack years: 39.00     Types: Cigarettes    Start date: 01/08/2005    Quit date: 2006    Years since quitting: 16.3  . Smokeless tobacco: Former Systems developer    Types: Chew  . Tobacco comment: only when playing softball (2 years), when they couldn't smoke (age [age])  Vaping Use  . Vaping Use: Never used  Substance and Sexual Activity  . Alcohol use: Yes    Alcohol/week: 1.0 standard drink    Types: 1 Glasses of wine per week    Comment: ONCE MONTHLY  . Drug use: Never  . Sexual activity: Yes    Partners: Female  Other Topics Concern  . Not on file  Social History Narrative   Lives with female partner Judson Roch) of 19 years--got married 06/11/13. 3 cats, 1 dog   2 cats 1 dog.   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   Social Determinants of Health   Financial Resource Strain: Not on file  Food Insecurity: Not on file  Transportation Needs: Not on file  Physical Activity: Not on file  Stress: Not on file  Social Connections: Not on file    Family History  Problem Relation Age of Onset  . Heart disease Mother 75       MI, stents x 2; CABG age 27  . Stroke Mother 48  . Hyperlipidemia Mother   . Hypertension Mother   . Depression Mother   . Diabetes Mother   . Thyroid disease Mother        Graves disease, s/p RAI, now hypothyroid  . Irritable bowel syndrome Mother   . COPD Mother   . Colon polyps Mother   . Cancer Mother        skin (not melanoma)  . Kidney Stones Mother   . Lung cancer Mother   . Stroke Father 15  . Hyperlipidemia Father   . Hypertension Father   . Depression Father   . Diabetes Father   . Thyroid disease Father        hypothyroidism  . COPD Father   . Heart disease Father 96       MI; s/p CABG; pacemaker  . Cancer Sister 54       pancreatic cancer  . Bipolar disorder Sister   . Lupus Paternal Aunt   . Heart  disease Maternal Uncle        s/p CABG, late 31's  . Cancer Maternal Grandfather        laryngeal, smoker  . Multiple sclerosis Cousin  Past Medical History:  Diagnosis Date  . Fibromyalgia 03/2019   put on cymbalta, and pain resolved  . Hyperlipidemia    borderline; dietary management  . Hypertension   . Hypothyroidism 2001  . Rheumatoid arthritis(714.0) 04/2006   Dr. Estanislado Pandy manages    Past Surgical History:  Procedure Laterality Date  . 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 PILONIDAL CYST EXCISION  1983  . TONSILLECTOMY  age 57  . WISDOM TOOTH EXTRACTION       Current Outpatient Medications on File Prior to Visit  Medication Sig Dispense Refill  . aspirin 81 MG tablet Take 81 mg by mouth every other day.     . benazepril (LOTENSIN) 10 MG tablet Take 1 tablet (10 mg total) by mouth daily. 90 tablet 3  . Calcium-Magnesium-Vitamin D (CALCIUM MAGNESIUM PO) Take by mouth 2 (two) times a day. 1/2 pill every 3 days    . DULoxetine (CYMBALTA) 60 MG capsule TAKE 1 CAPSULE(60 MG) BY MOUTH DAILY 30 capsule 2  . fish oil-omega-3 fatty acids 1000 MG capsule Take 1 g by mouth 2 (two) times daily.     . folic acid (FOLVITE) 1 MG tablet Take 2 tablets (2 mg total) by mouth daily. 180 tablet 2  . HUMIRA PEN 40 MG/0.4ML PNKT INJECT 1 PEN UNDER THE SKIN EVERY 14 DAYS. 6 each 0  . hydrochlorothiazide (HYDRODIURIL) 25 MG tablet Take 1 tablet (25 mg total) by mouth daily. 90 tablet 3  . loratadine (CLARITIN) 10 MG tablet Take 10 mg by mouth as needed for allergies.    Marland Kitchen MELATONIN PO Take by mouth as needed.    . methotrexate 50 MG/2ML injection INJECT 0.6 ML UNDER SKIN ONCE WEEKLY 8 mL 0  . Multiple Vitamins-Minerals (MULTIVITAMIN ADULT PO) Take by mouth daily.    Marland Kitchen SYNTHROID 137 MCG tablet TAKE 1 TABLET(137 MCG) BY MOUTH DAILY 90 tablet 3   No current facility-administered medications on file prior to visit.    Allergies  Allergen Reactions  . Penicillins Rash     Physical exam:  Today's Vitals   10/24/20 0847  BP: 132/90  Pulse: 78  Weight: 233 lb (105.7 kg)  Height: '5\' 7"'  (1.702 m)   Body mass index is 36.49 kg/m.   Wt Readings from Last 3 Encounters:  10/24/20 233 lb (105.7 kg)  09/07/20 234 lb 6.4 oz (106.3 kg)  09/05/20 233 lb 6.4 oz (105.9 kg)     Ht Readings from Last 3 Encounters:  10/24/20 '5\' 7"'  (2.202 m)  09/07/20 '5\' 7"'  (1.702 m)  09/05/20 '5\' 7"'  (1.702 m)      General: The patient is awake, alert and appears not in acute distress. The patient is well groomed. Head: Normocephalic, atraumatic. Neck is supple. Mallampati 2 ,  neck circumference: 15.75 inches . Nasal airflow  patent.  Retrognathia is not seen.  Dental status:  Cardiovascular:  Regular rate and cardiac rhythm by pulse,  without distended neck veins. Respiratory: Lungs are clear to auscultation.  Skin:  With evidence of mild ankle edema, no rash. Trunk: The patient's posture is erect.   Neurologic exam : The patient is awake and alert, oriented to place and time.   Memory subjective described as intact.  Attention span & concentration ability appears normal.  Speech is fluent,  without  dysarthria, dysphonia or aphasia.  Mood and affect are appropriate.   Cranial nerves: no loss of smell or taste reported  Pupils are  equal and briskly reactive to light. Funduscopic exam deferred.  Extraocular movements in vertical and horizontal planes were intact and without nystagmus. No Diplopia. Visual fields by finger perimetry are intact. Hearing was intact to soft voice and finger rubbing.    Facial sensation intact to fine touch.  Facial motor strength is symmetric and tongue and uvula move midline.  Neck ROM : rotation, tilt and flexion extension were normal for age and shoulder shrug was symmetrical.    Motor exam:  Symmetric bulk, tone and ROM.   Normal tone without cog wheeling, symmetric grip strength .   Sensory:  Fine touch, pinprick and vibration  were  normal.  Proprioception tested in the upper extremities was normal.   Coordination: Rapid alternating movements in the fingers/hands were of normal speed.  The Finger-to-nose maneuver was intact without evidence of ataxia, dysmetria or tremor.   Gait and station: Patient could rise unassisted from a seated position, walked without assistive device.  Stance is of normal width/ base and the patient turned with 3 steps.  Toe and heel walk were deferred.  Deep tendon reflexes: in the  upper and lower extremities are symmetric and intact.  Babinski response was deferred.       After spending a total time of 45  minutes face to face and additional time for physical and neurologic examination, review of laboratory studies,  personal review of imaging studies, reports and results of other testing and review of referral information / records as far as provided in visit, I have established the following assessments:    1) I have the pleasure of meeting Mrs. 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  She was fairly recently diagnosed as having fibromyalgia but she notes since the year 2007 that she has rheumatoid arthritis and the also tested positive for HLA-B27.  The presence of inflammatory diseases or conditions is often associated with excessive fatigue or sleepiness and discomfort at night.  The patient reports that she can sleep pretty much as long as mom lets her.  She does not have problems with insomnia she does have some hypersomnia but she relates this to flareups and not at baseline being present.  She does not report sleep attacks, she has not reported cataplexy, and she has been always a vivid dream a is colorful sometimes bizarre dream constructs but these have been repressed under the use of Cymbalta which is expected.  I doubt that we are dealing with narcolepsy as she is not finding short power naps refreshing.  I also think that this is a fatigue and  inflammatory marker related sleepiness that the patient describes.  Weather changes for example can make her extremely fatigued.  I will order a narcolepsy HLA test and offer a HST. She sleeps with a dental device to prevent bruxism damage to her teeth.  RV if positive in 2-3 month.   I would like to thank Dr. Estanislado Pandy, MD,  and Rita Ohara, [address] [place],   [id number] for allowing me to meet with and to take care of this pleasant patient.   In short, Jany Buckwalter is presenting with fatigue , a symptom that can be attributed to fibromyalgia.  Modafinil may help during these severe hypersomnia and fatigue spells, and I ordered 200 mg tabs for her.   I plan to follow up either personally or through our NP within 2-3 month.    Electronically signed by: Larey Seat, MD 10/24/2020 9:14  AM  [facility] and [employer] certified by [employer] of Sleep Medicine and Diplomate of the [employer] of Sleep Medicine. Board certified In Neurology through the [employer], Fellow of the [employer] of Neurology. Medical Director of [employer].

## 2020-10-24 NOTE — Patient Instructions (Signed)
Chronic Fatigue Syndrome Chronic fatigue syndrome (CFS) is a condition that causes extreme tiredness (fatigue). This condition is also known as myalgic encephalomyelitis (ME). The fatigue in CFS does not improve with rest, and it gets worse with physical or mental activity. Several other symptoms may occur along with fatigue. Symptoms may come and go, but they generally last for months. Sometimes, CFS gets better over time. In other cases, it can be a lifelong condition. There is no cure, but there are many possible treatments. You will need to work with your health care providers to find a treatment plan that works best for you. What are the causes? The cause of this condition is not known. CFS may be caused by a combination of things. Possible causes include:  An infection.  An abnormal body defense system (abnormal immune system).  Low blood pressure.  Poor diet.  Living with a lot of physical or emotional stress. What increases the risk? The following factors may make you more likely to develop this condition:  Being female.  Being 55 years old.  Having a family history of CFS. What are the signs or symptoms? The main symptom of this condition is fatigue that is severe enough to interfere with day-to-day activities. This fatigue does not get better with rest, and it gets worse with physical or mental activity. There are eight other major symptoms of CFS:  Discomfort and lack of energy (malaise) that lasts more than 24 hours after physical activity.  Sleep that does not relieve fatigue (unrefreshing sleep).  Short-term memory loss or confusion.  Joint pain without redness or swelling.  Muscle aches.  Headaches.  Painful and swollen glands (lymph nodes) in the neck or under the arms.  Sore throat. Other symptoms can include:  Cramps in the abdomen, constipation, or diarrhea (irritable bowel syndrome).  Chills and night sweats.  Vision changes.  Dizziness and  mental confusion (brain fog).  Clumsiness.  Sensitivity to food, noise, or odors.  Mood swings, depression, or anxiety attacks. How is this diagnosed? There are no tests that can diagnose this condition. Your health care provider will make the diagnosis based on:  Your symptoms and medical history.  A physical exam and a mental health exam.  Tests to rule out other conditions. It is important to make sure that your symptoms are not caused by another medical condition. Tests may include lab tests or X-rays. For your health care provider to diagnose CFS:  You must have had fatigue for at least 6 straight months.  Fatigue must be your first symptom, and it must be severe enough to interfere with day-to-day activities.  You must also have at least four of the eight other major symptoms of CFS.  There must be no other cause found for the fatigue. How is this treated? There is no cure for CFS. The condition affects everyone differently. You will need to work with your team of health care providers to find the best treatments for your symptoms. Your team may include your primary care provider, physical and exercise therapists, and mental health therapists. Treatment may include:  Having a regular bedtime routine to help improve your sleep.  Avoiding caffeine, alcohol, and tobacco or nicotine products.  Doing light exercise and stretching during the day. You may also want to try movement exercises, such as yoga or tai chi.  Taking medicines to help you sleep or to relieve joint or muscle pain.  Learning and practicing relaxation techniques, such as deep breathing and  muscle relaxation.  Using memory aids or doing brainteasers to improve memory and concentration.  Getting care for your body and mental well-being, such as: ? Seeing a mental health therapist to evaluate and treat depression, if necessary. ? Cognitive behavioral therapy (CBT). This therapy changes the way you think or  act in response to the fatigue. This may help improve how you feel. ? Trying massage therapy and acupuncture.   Follow these instructions at home: Eating and drinking  Avoid caffeine and alcohol.  Avoid heavy meals in the evening.  Eat a healthy diet that includes foods such as vegetables, fruits, fish, and lean meats.   Activity  Rest as told by your health care provider.  Avoid fatigue by pacing yourself during the day and getting enough sleep at night.  Exercise regularly, as told by your health care provider.  Go to bed and get up at the same time every day. Lifestyle  Ask your health care provider whether you should keep a diary. Your health care provider will tell you what information to write in the diary. This may include when you have fatigue and how medicines and other behaviors or treatments help to reduce the fatigue.  Consider joining a CFS support group.  Avoid stress and use stress-reducing techniques that you learn in therapy. General instructions  Take over-the-counter and prescription medicines only as told by your health care provider.  Do not use herbal or dietary supplements unless they are approved by your health care provider.  Maintain a healthy weight.  Do not use any products that contain nicotine or tobacco, such as cigarettes, e-cigarettes, and chewing tobacco. If you need help quitting, ask your health care provider.  Keep all follow-up visits as told by your health care provider. This is important.   Where to find more information Get more information or find a support group near you at one of these links:  American Myalgic Encephalomyelitis and Chronic Fatigue Syndrome Society: ammes.org  Centers for Disease Control and Prevention: http://www.wolf.info/ Contact a health care provider if:  Your symptoms do not get better or they get worse.  You feel angry, guilty, anxious, or depressed. Get help right away if:  You have thoughts of self-harm. If  you ever feel like you may hurt yourself or others, or have thoughts about taking your own life, get help right away. Go to your nearest emergency department or:  Call your local emergency services (911 in the [place]  Call a suicide crisis helpline, such as the [facility] at 832-169-6686. This is open 24 hours a day in the [place]  Text the Crisis Text Line at 7634573752 (in the [place].). Summary  Chronic fatigue syndrome (CFS) is a condition that causes extreme tiredness (fatigue). This fatigue does not improve with rest, and it gets worse with physical or mental activity.  There is no cure for CFS. The condition affects everyone differently. You will need to work with your team of health care providers to find the best treatments for your symptoms.  Exercise regularly, as told by your health care provider. Avoid stress and use stress-reducing techniques that you learn in therapy.  Contact a health care provider if your symptoms do not get better or they get worse. This information is not intended to replace advice given to you by your health care provider. Make sure you discuss any questions you have with your health care provider. Document Revised: 05/10/2019 Document Reviewed: 05/10/2019 Elsevier Patient Education  2021 Elsevier  Inc.

## 2020-10-26 ENCOUNTER — Telehealth: Payer: Self-pay | Admitting: Neurology

## 2020-10-26 NOTE — Telephone Encounter (Signed)
PA submitted through [facility]. [id number]. Will wait for determination.

## 2020-11-09 ENCOUNTER — Encounter: Payer: Self-pay | Admitting: Neurology

## 2020-11-09 NOTE — Telephone Encounter (Addendum)
PA denied due to pt not meeting criteria. Will inform the patient of good rx option

## 2020-11-17 ENCOUNTER — Encounter: Payer: Self-pay | Admitting: Family Medicine

## 2020-11-20 ENCOUNTER — Ambulatory Visit (INDEPENDENT_AMBULATORY_CARE_PROVIDER_SITE_OTHER): Payer: BC Managed Care – PPO | Admitting: Neurology

## 2020-11-20 DIAGNOSIS — G4733 Obstructive sleep apnea (adult) (pediatric): Secondary | ICD-10-CM

## 2020-11-20 DIAGNOSIS — R5382 Chronic fatigue, unspecified: Secondary | ICD-10-CM

## 2020-11-20 DIAGNOSIS — E6609 Other obesity due to excess calories: Secondary | ICD-10-CM

## 2020-11-20 DIAGNOSIS — G4714 Hypersomnia due to medical condition: Secondary | ICD-10-CM

## 2020-11-20 DIAGNOSIS — M797 Fibromyalgia: Secondary | ICD-10-CM

## 2020-11-20 DIAGNOSIS — D8989 Other specified disorders involving the immune mechanism, not elsewhere classified: Secondary | ICD-10-CM

## 2020-11-20 DIAGNOSIS — G4719 Other hypersomnia: Secondary | ICD-10-CM

## 2020-11-21 NOTE — Progress Notes (Signed)
[facility]  HOME SLEEP TEST (Watch PAT) REPORT  STUDY DATE: 11-21-20  DOB: 09/24/1965  MRN: [id number]  ORDERING CLINICIAN: Carmen Dohmeier, MD   REFERRING CLINICIAN: Knapp, Eve, MD   CLINICAL INFORMATION/HISTORY: Jill Mathews has a medical history of  HLA B 27 positive rheumatological condition/ Fibromyalgia (03/2019), Hyperlipidemia, Hypertension, Hypothyroidism (2001), Obesity and Rheumatoid arthritis(714.0) (04/2006) on HUMIRA and MTX. She presents with excessive fatigue, diffuse pain and daytime sleepiness, and is not known to snore. She reports vivid and frequent dreams before starting on Cymbalta.   Epworth sleepiness score: 18/24. Fatigue SS at 61/63 points (!)   BMI: 36.7 kg/m Neck Circumference: 15.75"  FINDINGS:   Total Recording Time (hours, min): 9 h 15 min Total Sleep Time (hours, min):  8 h 26 min  Percent REM (%):    31.31%   Respiratory Indices:   Calculated pAHI (per hour):  11.8/hour        REM pAHI:    18.7/hour      NREM pAHI: 8.6/hour Supine AHI: 11.7 /hour  Oxygen Saturation Statistics:    Oxygen Saturation (%) Mean:       93%  Minimum oxygen saturation (%):  87%  O2 Saturation Range (%):            87-97%   O2 Saturation (minutes) <89%:     0.1 min  Pulse Rate Statistics:   Pulse Mean (bpm):   67  with Pulse Range from 55- 87 bpm.  IMPRESSION: This patient presents with mild OSA (obstructive sleep apnea)  at an AHI of 11.8/h, mild snoring ( according to RDI at 12.5/h) and has strong REM sleep exacerbated sleep apnea. No prolonged hypoxemia and no significant positional component was associated.   RECOMMENDATION: Given BMI and Epworth score, this mild OSA is still REM sleep dependent and should be treated with positive airway pressure therapy.  I will order an autotitration CPAP device with a pressure setting of 6-16 cm water, 3 cm water EPR, heated humidification, and the patient should choose her mask / interface of comfort.      INTERPRETING PHYSICIAN:  Carmen Dohmeier, MD    [facility] and [facility] Board certified by The American Board of Sleep Medicine and Diplomate of the American Academy of Sleep Medicine. Board certified In Neurology through the ABPN, Fellow of the American Academy of Neurology. Medical Director of [facility].   Sleep Summary   Start Study Time: End Study Time: Total Recording Time:  9:12:12 PM 6:27:27 AM 9 h, 15 min  Total Sleep Time % REM of Sleep Time:  8 h, 26 min  31.3     Respiratory Indices     Total Events REM NREM All Night  pRDI: pAHI 3%: ODI 4%: pAHIc 3%: % CSR: pAHI 4%: 104   98  36  1 0.0 35 19.5 18.7 6.5 0.4 9.3 8.6 3.3 0.0 12.5 11.8 4.3 0.1 0.0 4.2     Oxygen Saturation Statistics   Mean: 93 Minimum: 87 Maximum: 97  Mean of Desaturations Nadirs (%):   91  Oxygen Desatur. %:  4-9 10-20 >20 Total  Events Number Total   35  1 97.2 2.8  0 0.0  36 100.0  Oxygen Saturation: <90 <=88 <85 <80 <70  Duration (minutes): Sleep % 0.8 0.2 0.1 0.0 0.0 0.0 0.0 0.0 0.0 0.0     Pulse Rate Statistics during Sleep (BPM)     Mean: 67 Minimum: 55 Maximum: 87           Body Position Statistics  Position Supine Prone Right Left Non-Supine  Sleep (min) 217.0 56.5 74.5 158.3 289.3  Sleep % 42.9 11.2 14.7 31.3 57.1  pRDI 12.5 4.3 10.7 16.4 12.5  pAHI 3% 11.7 4.3 9.8 15.6 11.9  ODI 4% 5.0 1.1 4.1 4.7 3.8     Snoring Statistics Snoring Level (dB) >40 >50 >60 >70 >80 >Threshold (45)  Sleep (min) 266.6 5.9 1.7 0.0 0.0 22.7  Sleep % 52.7 1.2 0.3 0.0 0.0 4.5   Mean: 41 dB

## 2020-11-24 DIAGNOSIS — D8989 Other specified disorders involving the immune mechanism, not elsewhere classified: Secondary | ICD-10-CM | POA: Insufficient documentation

## 2020-11-24 DIAGNOSIS — G4719 Other hypersomnia: Secondary | ICD-10-CM | POA: Insufficient documentation

## 2020-11-24 DIAGNOSIS — E6609 Other obesity due to excess calories: Secondary | ICD-10-CM | POA: Insufficient documentation

## 2020-11-24 DIAGNOSIS — G4714 Hypersomnia due to medical condition: Secondary | ICD-10-CM | POA: Insufficient documentation

## 2020-11-24 DIAGNOSIS — R5382 Chronic fatigue, unspecified: Secondary | ICD-10-CM | POA: Insufficient documentation

## 2020-11-24 NOTE — Progress Notes (Signed)
IMPRESSION: This patient presents with mild OSA (obstructive sleep apnea)  at an AHI of 11.8/h, mild snoring ( according to RDI at 12.5/h) and has strong REM sleep exacerbated sleep apnea. No prolonged hypoxemia and no significant positional component was associated.  RECOMMENDATION: Given BMI and Epworth score, this mild OSA is still REM sleep dependent and should be treated with positive airway pressure therapy. Weight loss will help reducing the apnea too.   I will order an autotitration CPAP device with a pressure setting of 6-16 cm water, 3 cm water EPR, heated humidification, and the patient should choose her mask / interface of comfort.    INTERPRETING PHYSICIAN: Melvyn Novas, MD

## 2020-11-24 NOTE — Addendum Note (Signed)
Addended by: Melvyn Novas on: 11/24/2020 02:48 PM   Modules accepted: Orders

## 2020-11-24 NOTE — Procedures (Signed)
[facility] TEST (Watch PAT) REPORT  STUDY DATE: 11-21-20  DOB: 1966-01-15  MRN: [id number]  ORDERING CLINICIAN: Larey Seat, MD   REFERRING CLINICIAN: Rita Ohara, MD   CLINICAL INFORMATION/HISTORY: Jill Mathews has a medical history of  HLA B 27 positive rheumatological condition/ Fibromyalgia (03/2019), Hyperlipidemia, Hypertension, Hypothyroidism (2001), Obesity and Rheumatoid arthritis(714.0) (04/2006) on HUMIRA and MTX. She presents with excessive fatigue, diffuse pain and daytime sleepiness, and is not known to snore. She reports vivid and frequent dreams before starting on Cymbalta.   Epworth sleepiness score: 18/24. Fatigue SS at 61/63 points (!)   BMI: 36.7 kg/m Neck Circumference: 15.75"  FINDINGS:   Total Recording Time (hours, min): 9 h 15 min Total Sleep Time (hours, min):  8 h 26 min  Percent REM (%):    31.31%   Respiratory Indices:   Calculated pAHI (per hour):  11.8/hour        REM pAHI:    18.7/hour      NREM pAHI: 8.6/hour Supine AHI: 11.7 /hour  Oxygen Saturation Statistics:    Oxygen Saturation (%) Mean:       93%  Minimum oxygen saturation (%):  87%  O2 Saturation Range (%):            87-97%   O2 Saturation (minutes) <89%:     0.1 min  Pulse Rate Statistics:   Pulse Mean (bpm):   67  with Pulse Range from 55- 87 bpm.  IMPRESSION: This patient presents with mild OSA (obstructive sleep apnea)  at an AHI of 11.8/h, mild snoring ( according to RDI at 12.5/h) and has strong REM sleep exacerbated sleep apnea. No prolonged hypoxemia and no significant positional component was associated.   RECOMMENDATION: Given BMI and Epworth score, this mild OSA is still REM sleep dependent and should be treated with positive airway pressure therapy.  I will order an autotitration CPAP device with a pressure setting of 6-16 cm water, 3 cm water EPR, heated humidification, and the patient should choose her mask / interface of comfort.      INTERPRETING PHYSICIAN:  Larey Seat, MD    [facility] and [facility] Board certified by The [employer] of Sleep Medicine and Diplomate of the [employer] of Sleep Medicine. Board certified In Neurology through the [place], Fellow of the [employer] of Neurology. Medical Director of [employer].   Sleep Summary   Start Study Time: End Study Time: Total Recording Time:  9:12:12 PM 6:27:27 AM 9 h, 15 min  Total Sleep Time % REM of Sleep Time:  8 h, 26 min  31.3     Respiratory Indices     Total Events REM NREM All Night  pRDI: pAHI 3%: ODI 4%: pAHIc 3%: % CSR: pAHI 4%: 104   98  36  1 0.0 35 19.5 18.7 6.5 0.4 9.3 8.6 3.3 0.0 12.5 11.8 4.3 0.1 0.0 4.2     Oxygen Saturation Statistics   Mean: 93 Minimum: 87 Maximum: 97  Mean of Desaturations Nadirs (%):   91  Oxygen Desatur. %:  4-9 10-20 >20 Total  Events Number Total   35  1 97.2 2.8  0 0.0  36 100.0  Oxygen Saturation: <90 <=88 <85 <80 <70  Duration (minutes): Sleep % 0.8 0.2 0.1 0.0 0.0 0.0 0.0 0.0 0.0 0.0     Pulse Rate Statistics during Sleep (BPM)     Mean: 67 Minimum: 55 Maximum: 87  Body Position Statistics  Position Supine Prone Right Left Non-Supine  Sleep (min) 217.0 56.5 74.5 158.3 289.3  Sleep % 42.9 11.2 14.7 31.3 57.1  pRDI 12.5 4.3 10.7 16.4 12.5  pAHI 3% 11.7 4.3 9.8 15.6 11.9  ODI 4% 5.0 1.1 4.1 4.7 3.8     Snoring Statistics Snoring Level (dB) >40 >50 >60 >70 >80 >Threshold (45)  Sleep (min) 266.6 5.9 1.7 0.0 0.0 22.7  Sleep % 52.7 1.2 0.3 0.0 0.0 4.5   Mean: 41 dB

## 2020-11-27 ENCOUNTER — Telehealth: Payer: Self-pay | Admitting: Neurology

## 2020-11-27 NOTE — Telephone Encounter (Signed)
-----   Message from Melvyn Novas, MD sent at 11/24/2020  2:47 PM EDT ----- IMPRESSION: This patient presents with mild OSA (obstructive sleep apnea)  at an AHI of 11.8/h, mild snoring ( according to RDI at 12.5/h) and has strong REM sleep exacerbated sleep apnea. No prolonged hypoxemia and no significant positional component was associated.  RECOMMENDATION: Given BMI and Epworth score, this mild OSA is still REM sleep dependent and should be treated with positive airway pressure therapy. Weight loss will help reducing the apnea too.   I will order an autotitration CPAP device with a pressure setting of 6-16 cm water, 3 cm water EPR, heated humidification, and the patient should choose her mask / interface of comfort.    INTERPRETING PHYSICIAN: Melvyn Novas, MD

## 2020-11-27 NOTE — Telephone Encounter (Signed)
Called patient to discuss sleep study results. No answer at this time. LVM for the patient to call back.   

## 2020-11-28 NOTE — Telephone Encounter (Signed)
I called pt. I advised pt that Dr. Vickey Huger reviewed their sleep study results and found that pt has sleep apnea. Dr. Vickey Huger recommends that pt starts auto CPAP. I reviewed PAP compliance expectations with the pt. Pt is agreeable to starting a CPAP. I advised pt that an order will be sent to a DME, [facility] and [facility] will call the pt within about one week after they file with the pt's insurance. [facility]) will show the pt how to use the machine, fit for masks, and troubleshoot the CPAP if needed. A follow up appt was made for insurance purposes with Dr. Vickey Huger on 03/06/2021 at 1:30 pm. Pt verbalized understanding to arrive 15 minutes early and bring their CPAP. A letter with all of this information in it will be mailed to the pt as a reminder. I verified with the pt that the address we have on file is correct. Pt verbalized understanding of results. Pt had no questions at this time but was encouraged to call back if questions arise. I have sent the order to [facility])  and have received confirmation that they have received the order.

## 2020-11-28 NOTE — Progress Notes (Signed)
Office Visit Note  [name]             Date of Birth: 04-22-1966           MRN: [id number]             PCP: Rita Ohara, MD Referring: Rita Ohara, MD Visit Date: 12/12/2020 Occupation: @GUAROCC @  Subjective:  Left knee joint pain    History of Present Illness: Jill Mathews is a 55 y.o. female with history of seropositive rheumatoid arthritis and myofascial pain syndrome.  Patient is on Humira 40 mg subcutaneous injections every 14 days, methotrexate 0.6 mL sq injections once weekly, and folic acid 2 mg by mouth daily.  She denies any recent rheumatoid arthritis flares.  She states on 12/03/20 she was wading in a river and felt a "pop" in her right knee.  She was evaluated by her orthopedist at [facility] on 12/05/20 and had updated x-rays and a cortisone injection at that time. She will be [facility].  She states she has been wearing a brace on a daily basis which has significantly improved her discomfort while weight bearing.  She denies any mechanical symptoms, swelling, or bruising.  She states she continues to have discomfort in her left shoulder joint, which inititally started in late February/early march.  She went to physical therapy which improved the muscle and soft tissue tenderness and discomfort. She had dry needing which was very beneficial but she continues to have pain in the left shoulder joint. She takes motrin as needed for pain relief.  She has tried to remain active fishing, hiking, and recently got a Building services engineer with pool access.  She is taking cymbalta 30 mg 2 capsules daily.  She could not tolerate taking 90 mg of cymbalta due to the side effect of heat intolerance.  She continues to have fatigue on a daily basis but it has improved slightly.  She has noticed increased brain fog and word searching.  She is concerned about returning to work in August due to the increased stress and is considering short term  disability in the future if she has develops recurrent or severe MFPS flares.  She states since her last office visit she was diagnosed with sleep apnea, but she has been unable to get a CPAP due to them being on backorder.       Activities of Daily Living:  Patient reports morning stiffness for 10 minutes.   Patient Reports nocturnal pain.  Difficulty dressing/grooming: Denies Difficulty climbing stairs: Reports Difficulty getting out of chair: Denies Difficulty using hands for taps, buttons, cutlery, and/or writing: Denies  Review of Systems  Constitutional:  Positive for fatigue.  HENT:  Positive for mouth sores, mouth dryness and nose dryness.   Eyes:  Positive for itching and dryness. Negative for pain.  Respiratory:  Negative for shortness of breath and difficulty breathing.   Cardiovascular:  Negative for chest pain and palpitations.  Gastrointestinal:  Negative for blood in stool, constipation and diarrhea.  Endocrine: Positive for heat intolerance. Negative for increased urination.  Genitourinary:  Negative for difficulty urinating.  Musculoskeletal:  Positive for joint pain, joint pain and morning stiffness. Negative for myalgias, muscle tenderness and myalgias.  Skin:  Negative for color change, rash and redness.  Allergic/Immunologic: Negative for susceptible to infections.  Neurological:  Positive for numbness, memory loss and weakness. Negative for dizziness and headaches.  Hematological:  Positive for bruising/bleeding tendency.  Psychiatric/Behavioral:  Positive for  confusion.    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 Corliss Skains manages   Sleep apnea    per patient, dx by neuro    Family History  Problem Relation Age of Onset   Heart disease Mother 56       MI, stents x 2; CABG age 17   Stroke Mother 70   Hyperlipidemia Mother    Hypertension Mother    Depression Mother    Diabetes Mother    Thyroid disease Mother        Graves disease, s/p RAI, now hypothyroid   Irritable bowel syndrome Mother    COPD Mother    Colon polyps Mother    Cancer Mother        skin (not melanoma)   Kidney Stones Mother    Lung cancer Mother    Stroke Father 22   Hyperlipidemia Father    Hypertension Father    Depression Father    Diabetes Father    Thyroid disease Father        hypothyroidism   COPD Father    Heart disease Father 96       MI; s/p CABG; pacemaker   Cancer Sister 47       pancreatic cancer   Bipolar disorder Sister    Heart disease Maternal Uncle        s/p CABG, late 60's   Lupus Paternal Aunt    Cancer Maternal Grandfather        laryngeal, smoker   Multiple sclerosis Cousin    Past Surgical History:  Procedure Laterality Date   EXCISION MORTON'S NEUROMA  05/2017   right foot    PILONIDAL CYST EXCISION  89   TONSILLECTOMY  age 24   WISDOM TOOTH EXTRACTION     Social History   Social History Narrative   Lives with female partner Maralyn Sago) of 57 years--got married 06/11/13. 3 cats, 1 dog   2 cats 1 dog.   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   Immunization History  Administered Date(s) Administered   Influenza Inj Mdck Quad Pf 04/11/2019   Influenza Split 03/10/2012   Influenza,inj,Quad PF,6+ Mos 04/11/2019   PFIZER(Purple Top)SARS-COV-2 Vaccination 08/27/2019, 09/10/2019, 01/09/2020   Pneumococcal Conjugate-13 11/01/2015   Pneumococcal Polysaccharide-23 07/02/2006, 12/30/2018   Tdap 09/09/2012     Objective: Vital Signs: BP 128/86 (BP Location: Left Arm, Patient Position: Sitting, Cuff Size: Normal)   Pulse 82  Resp 17   Ht $R'5\' 7"'Lw$  (1.702 m)   Wt 233 lb 12.8 oz (106.1 kg)   LMP 01/13/2013   BMI 36.62 kg/m    Physical Exam Vitals and nursing note reviewed.  Constitutional:      Appearance: She is well-developed.  HENT:     Head: Normocephalic and atraumatic.  Eyes:     Conjunctiva/sclera: Conjunctivae normal.  Pulmonary:     Effort: Pulmonary effort is normal.  Abdominal:     Palpations: Abdomen is soft.  Musculoskeletal:     Cervical back: Normal range of motion.  Skin:    General: Skin is warm and dry.     Capillary Refill: Capillary refill takes less than 2 seconds.  Neurological:     Mental Status: She is alert and oriented to person, place, and time.  Psychiatric:        Behavior: Behavior normal.     Musculoskeletal Exam: C-spine, thoracic spine, and lumbar spine good ROM.  Painful ROM of the left shoulder.  Right shoulder has good ROM with no discomfort.  Elbow joints, wrist joints, MCPs, PIPs, and DIPs good ROM with no synovitis.  PIP and DIP thickening consistent with osteoarthritis of both hands.  Tenderness over bilateral 4th MCP joints. Complete fist formation bilaterally.  Hip joints good ROM with no discomfort.  Knee joints good ROM with discomfort in the right knee.  Mild warmth of the right knee  noted.  Ankle joints good ROM with no tenderness or joint swelling.  No tenderness or synovitis of MCP joints.  PIP and DIP prominence consistent with osteoarthritis of both feet.   CDAI Exam: CDAI Score: 0.6  Patient Global: 3 mm; Provider Global: 3 mm Swollen: 0 ; Tender: 0  Joint Exam 12/12/2020   No joint exam has been documented for this visit   There is currently no information documented on the homunculus. Go to the Rheumatology activity and complete the homunculus joint exam.  Investigation: No additional findings.  Imaging: XR Knee 1-2 Views Right  Result Date: 12/05/2020 Radiographs of right knee show mild degenerative changes. No acute finding.   Home sleep test  Result Date: 11/20/2020 Larey Seat, MD     11/24/2020  2:43 PM [facility] (Watch PAT) REPORT STUDY DATE: 11-21-20 DOB: 1965-07-03 MRN: [id number] ORDERING CLINICIAN: Larey Seat, MD  REFERRING CLINICIAN: Rita Ohara, MD CLINICAL INFORMATION/HISTORY: Daaiyah Baumert has a medical history of  HLA B 27 positive rheumatological condition/ Fibromyalgia (03/2019), Hyperlipidemia, Hypertension, Hypothyroidism (2001), Obesity and Rheumatoid arthritis(714.0) (04/2006) on HUMIRA and MTX. She presents with excessive fatigue, diffuse pain and daytime sleepiness, and is not known to snore. She reports vivid and frequent dreams before starting on Cymbalta. Epworth sleepiness score: 18/24. Fatigue SS at 61/63 points (!) BMI: 36.7 kg/m Neck Circumference: 15.75" FINDINGS: Total Recording Time (hours, min): 9 h 15 min Total Sleep Time (hours, min):  8 h 26 min Percent REM (%):    31.31% Respiratory Indices: Calculated pAHI (per hour):  11.8/hour       REM pAHI:    18.7/hour     NREM pAHI: 8.6/hour Supine AHI: 11.7 /hour Oxygen Saturation Statistics:  Oxygen Saturation (%) Mean:       93% Minimum oxygen saturation (%):  87% O2 Saturation Range (%):            87-97%  O2 Saturation (minutes) <89%:     0.1  min Pulse Rate Statistics: Pulse Mean (bpm):   67  with Pulse Range from 55- 87 bpm. IMPRESSION: This patient presents with mild OSA (obstructive sleep apnea)  at an AHI of 11.8/h, mild snoring ( according to RDI at 12.5/h) and has strong REM sleep exacerbated sleep apnea. No prolonged hypoxemia and no significant positional component was associated. RECOMMENDATION: Given BMI and Epworth score, this mild OSA is still REM sleep dependent and should be treated with positive airway pressure therapy. I will order an autotitration CPAP device with a pressure setting of 6-16 cm water, 3 cm water EPR, heated humidification, and the patient should choose her mask / interface of comfort. INTERPRETING PHYSICIAN: Larey Seat, MD  [facility] and [facility] Sleep Board certified by The [employer] of Sleep Medicine and Diplomate of the [employer] of Sleep Medicine. Board certified In Neurology through the [employer], Fellow of the [employer] of Neurology. Medical Director of [employer]. Sleep Summary Start Study Time: End Study Time: Total Recording Time:  9:12:12 PM 6:27:27 AM 9 h, 15 min Total Sleep Time % REM of Sleep Time:  8 h, 26 min  31.3  Respiratory Indices  Total Events REM NREM All Night pRDI: pAHI 3%: ODI 4%: pAHIc 3%: % CSR: pAHI 4%: 104   98  36  1 0.0 35 19.5 18.7 6.5 0.4 9.3 8.6 3.3 0.0 12.5 11.8 4.3 0.1 0.0 4.2  Oxygen Saturation Statistics Mean: 93 Minimum: 87 Maximum: 97 Mean of Desaturations Nadirs (%):   91 Oxygen Desatur. %:  4-9 10-20 >20 Total Events Number Total   35  1 97.2 2.8  0 0.0  36 100.0 Oxygen Saturation: <90 <=88 <85 <80 <70 Duration (minutes): Sleep % 0.8 0.2 0.1 0.0 0.0 0.0 0.0 0.0 0.0 0.0  Pulse Rate Statistics during Sleep (BPM)  Mean: 67 Minimum: 55 Maximum: 87  Body Position Statistics Position Supine Prone Right Left Non-Supine Sleep (min) 217.0 56.5 74.5 158.3 289.3 Sleep % 42.9 11.2 14.7 31.3 57.1 pRDI 12.5 4.3 10.7 16.4 12.5 pAHI 3% 11.7 4.3 9.8 15.6  11.9 ODI 4% 5.0 1.1 4.1 4.7 3.8   Snoring Statistics Snoring Level (dB) >40 >50 >60 >70 >80 >Threshold (45) Sleep (min) 266.6 5.9 1.7 0.0 0.0 22.7 Sleep % 52.7 1.2 0.3 0.0 0.0 4.5  Mean: 41 dB   Recent Labs: Lab Results  Component Value Date   WBC 8.2 09/05/2020   HGB 14.9 09/05/2020   PLT 289 09/05/2020   NA 140 09/05/2020   K 4.3 09/05/2020   CL 104 09/05/2020   CO2 31 09/05/2020   GLUCOSE 94 09/05/2020   BUN 19 09/05/2020   CREATININE 0.90 09/05/2020   BILITOT 0.4 09/05/2020   ALKPHOS 70 12/29/2019   AST 15 09/05/2020   ALT 19 09/05/2020   PROT 6.6 09/05/2020   ALBUMIN 4.2 12/29/2019   CALCIUM 9.7 09/05/2020   GFRAA 83 09/05/2020   QFTBGOLD NEGATIVE 05/05/2017   QFTBGOLDPLUS NEGATIVE 06/12/2020    Speciality Comments: Prior therapy includes: Enbrel (inadequate response), Plaquenil (inadequate response)   Procedures:  Large Joint Inj: L subacromial bursa on 12/12/2020 9:09 AM Indications: pain Details: 27 G 1.5 in needle, posterior approach  Arthrogram: No  Medications: 1.5 mL lidocaine 1 %; 40 mg triamcinolone acetonide 40 MG/ML Aspirate: 0 mL Outcome: tolerated well, no immediate complications Procedure, treatment alternatives, risks and benefits explained, specific risks discussed. Consent was given by the patient. Immediately prior to procedure a time out was called to verify the correct patient, procedure, equipment, support staff and site/side marked as  required. Patient was prepped and draped in the usual sterile fashion.    Allergies: Penicillins   Assessment / Plan:     Visit Diagnoses: Rheumatoid arthritis involving multiple sites with positive rheumatoid factor (HCC) - +RF,+ anti-CCP: She has no synovitis on exam.  She has not had any recent rheumatoid arthritis flares.  She is clinically doing well on Humira 40 mg subcutaneous injections every 14 days, methotrexate 0.6 mL sq injections once weekly, folic acid 2 mg by mouth daily.  She has not missed any doses  of these medications recently and is tolerating them without any side effects.  She presents today with ongoing discomfort in her left shoulder joint.  She went to physical therapy and underwent dry needling which improved soft tissue mass and discomfort.  X-rays of the left shoulder were obtained today which were consistent with acromioclavicular joint space narrowing the left subacromial bursa was injected with cortisone and the procedure note was completed above.  She is also been having increased discomfort in the right knee joint after waiting in a river and hearing a pop in her right knee on 12/03/2020.  She was evaluated by her orthopedist on 12/05/2020 at which time x-rays were updated and a cortisone injection was performed.  She has been wearing a knee brace on a daily basis which has allowed her to bear weight without any discomfort.  She will be [facility].  She was advised to notify us if her discomfort persists or worsens but she plans on continuing to follow-up with her orthopedist as recommended.  Overall her rheumatoid arthritis seems well controlled on the current treatment regimen.  She will remain on Humira and methotrexate as prescribed.  She does not need any refills at this time.  She will follow-up in the office in 5 months.  High risk medication use - Humira 40 mg sq injections every 14 days (Started in February 2020), methotrexate 0.6 ml sq injections every 7 days, and folic acid 2 mg po daily.  CBC and CMP were drawn on 09/05/2020.  She is due to update lab work today.  Orders for CBC and CMP were released.  Her next lab work will be due in October and every 3 months to monitor for drug toxicity.  TB Gold negative on 06/12/2020 and will continue to be monitored yearly.- Plan: CBC with Differential/Platelet, COMPLETE METABOLIC PANEL WITH GFR She has not had any recent infections.  Discussed the importance of holding humira and methotrexate if  she develops signs or symptoms of an infection and to resume once the infection has completely cleared.    HLA B27 (HLA B27 positive)  Bicipital tendinitis, left shoulder: She has tenderness over the subacromial bursa.  A cortisone injection was performed today.  She tolerated procedure well.  Procedure note was completed above.  Aftercare was discussed.  Chronic left shoulder pain -She has been experiencing discomfort in her left shoulder since late February /early March.  She was going to [facility] and having dry needling performed which alleviated most of her muscle tenderness and soft tissue discomfort.  She continues to have pain with range of motion especially internal rotation.  Tenderness over the left [facility] joint was noted.  X-rays of the left shoulder were ordered today which were consistent with [facility] joint narrowing.  The left subacromial bursa was injected with cortisone today.  She tolerated the procedure well.  Procedure note was completed above.  Aftercare was discussed.  She  was given a handout of shoulder joint exercises to perform.  She was advised to notify us if her discomfort persists or worsens.  Plan: XR Shoulder Left  Primary osteoarthritis of right knee: She presents today with increased pain in the right knee joint which initially started after hearing a pop in her right knee while waiting in a river on 12/03/2020.  She did not notice any swelling or bruising initially but was having severe pain while bearing weight.  She was evaluated by her orthopedist on 12/05/2020 updated x-rays which were consistent with mild degenerative changes but no acute abnormalities noted.  She had a cortisone injection on 11/27/2020 and has been wearing a brace while weightbearing which has alleviated her discomfort significantly.  She is not experiencing any mechanical symptoms or instability at this time.  On examination she has good range of motion with some discomfort but no effusion was noted.   She will be starting physical therapy tomorrow and [facility].  She was advised to notify us if her discomfort persists or worsens at which time an MRI can be ordered.  Myofascial pain - She continues to intermittent myalgias or muscle tenderness.  She is taking Cymbalta 30 mg 1 capsule twice daily.  She cannot tolerate taking 90 mg of Cymbalta due to due to intolerance.  Overall she has noticed improvement while taking Cymbalta.  Discussed the importance of regular exercise and good sleep hygiene.  She is concerned about returning to work in August due to having recurrent severe flares last school year.  She plans on filing for short-term disability in the future if she develops more frequent flares.  Trapezius muscle spasm: She has intermittent trapezius muscle tension and tenderness.  Her discomfort improved significantly while having [facility].   Hypersomnolence: She was recently diagnosed with sleep apnea but she has been unable to start using a CPAP due to the devices being on backorder.   Other fatigue: Stable.  She remains active fishing, hiking, and recently got a gym membership with pool access.  Hopefully her fatigue and daytime drowsiness will improve once she is able to start using CPAP.   Memory loss:  She continues to experience intermittent brain fog and memory changes.  She has also noticed issues with word searching.  She is concerned about returning to work full time in August due to being under increased stress which may exacerbate her fatigue and brain fog.  Discussed the importance of good sleep hygiene and regular exercise.  She will be starting to use a CPAP once available so hopefully some of her daytime drowsiness will improve.  She was encouraged to follow up with Dr. Brett Fairy to further discuss these memory changes.   Other medical conditions are listed as follows:   Keratitis  History of  hyperlipidemia  Prediabetes  History of hypothyroidism  History of hypertension    Orders: Orders Placed This Encounter  Procedures   Large Joint Inj   XR Shoulder Left   CBC with Differential/Platelet   COMPLETE METABOLIC PANEL WITH GFR   No orders of the defined types were placed in this encounter.    Follow-Up Instructions: Return in about 5 months (around 05/14/2021) for Rheumatoid arthritis, Myofascial pain syndrome .   Ofilia Neas, PA-C  Note - This record has been created using Dragon software.  Chart creation errors have been sought, but may not always  have been located. Such creation errors do not reflect on  the  standard of medical care.

## 2020-12-05 ENCOUNTER — Ambulatory Visit: Payer: BC Managed Care – PPO | Admitting: Family

## 2020-12-05 ENCOUNTER — Encounter: Payer: Self-pay | Admitting: Family

## 2020-12-05 ENCOUNTER — Ambulatory Visit: Payer: Self-pay

## 2020-12-05 DIAGNOSIS — G8929 Other chronic pain: Secondary | ICD-10-CM | POA: Diagnosis not present

## 2020-12-05 DIAGNOSIS — M25561 Pain in right knee: Secondary | ICD-10-CM

## 2020-12-05 MED ORDER — METHYLPREDNISOLONE ACETATE 40 MG/ML IJ SUSP
40.0000 mg | INTRAMUSCULAR | Status: AC | PRN
  Administered 2020-12-05: 40 mg via INTRA_ARTICULAR

## 2020-12-05 MED ORDER — LIDOCAINE HCL 1 % IJ SOLN
5.0000 mL | INTRAMUSCULAR | Status: AC | PRN
  Administered 2020-12-05: 5 mL

## 2020-12-05 NOTE — Progress Notes (Signed)
Office Visit Note   [name]           Date of Birth: September 10, 1965           MRN: [id number] Visit Date: 12/05/2020              Requested by: Rita Ohara, MD [address] [place],  [place] [address] PCP: Rita Ohara, MD  Chief Complaint  Patient presents with   Right Knee - Pain      HPI: Patient is a 55 year old woman is seen today for initial evaluation right knee pain.  She was walking [place] a river over the weekend when she had a pivot injury to her right knee felt and heard a pop at the time and had onset of tremendous pain since this has been quite painful for her with deep flexion or any sort of pivoting walking on uneven terrain she states if she walks on flat terrain straight forward she is not having much of an issue no pain at rest she has been using Motrin for pain denies any locking or catching no mechanical symptoms.  Assessment & Plan: Visit Diagnoses:  1. Chronic pain of right knee     Plan: Depo-Medrol injection today.  Patient tolerated this well.  Discussed if she does not get relief with this we could consider another injection versus MRI of the right knee evaluate for meniscal injury.  Follow-Up Instructions: No follow-ups on file.   Right Knee Exam   Muscle Strength  The patient has normal right knee strength.  Tenderness  The patient is experiencing tenderness in the medial joint line.  Range of Motion  The patient has normal right knee ROM.  Tests  Varus: negative Valgus: negative  Other  Erythema: absent Swelling: none     Patient is alert, oriented, no adenopathy, well-dressed, normal affect, normal respiratory effort.   Imaging: No results found. No images are attached to the encounter.  Labs: Lab Results  Component Value Date   HGBA1C 5.9 (A) 01/06/2020   HGBA1C 6.0 (H) 12/28/2018   HGBA1C 5.9 (H) 03/20/2018   ESRSEDRATE 2 08/07/2020   ESRSEDRATE 11 03/24/2019     Lab Results  Component Value  Date   ALBUMIN 4.2 12/29/2019   ALBUMIN 4.6 03/20/2018   ALBUMIN 3.8 12/26/2016    No results found for: MG Lab Results  Component Value Date   VD25OH 36 05/25/2012    No results found for: PREALBUMIN CBC EXTENDED Latest Ref Rng & Units 09/05/2020 06/12/2020 12/29/2019  WBC 3.8 - 10.8 Thousand/uL 8.2 6.0 6.1  RBC 3.80 - 5.10 Million/uL 4.73 4.63 4.74  HGB 11.7 - 15.5 g/dL 14.9 14.6 15.0  HCT 35.0 - 45.0 % 45.3(H) 43.9 45.8  PLT 140 - 400 Thousand/uL 289 267 308  NEUTROABS 1,500 - 7,800 cells/uL 4,969 2,400 2.7  LYMPHSABS 850 - 3,900 cells/uL 2,296 2,802 2.8     There is no height or weight on file to calculate BMI.  Orders:  Orders Placed This Encounter  Procedures   XR Knee 1-2 Views Right   No orders of the defined types were placed in this encounter.    Procedures: Large Joint Inj: R knee on 12/05/2020 10:03 AM Indications: pain Details: 18 G 1.5 in needle, anteromedial approach Medications: 5 mL lidocaine 1 %; 40 mg methylPREDNISolone acetate 40 MG/ML Consent was given by the patient.     Clinical Data: No additional findings.  ROS:  All other systems negative, except as noted  in the HPI. Review of Systems  Constitutional:  Negative for chills and fever.  Cardiovascular:  Negative for leg swelling.  Musculoskeletal:  Positive for arthralgias and gait problem. Negative for joint swelling.  Neurological:  Negative for weakness and numbness.   Objective: Vital Signs: LMP 01/13/2013   Specialty Comments:  No specialty comments available.  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 Estanislado Pandy manages    Family History  Problem Relation Age of Onset   Heart disease Mother 68       MI, stents x 2; CABG age 10   Stroke Mother 44   Hyperlipidemia Mother    Hypertension Mother    Depression Mother    Diabetes Mother    Thyroid disease Mother        Graves disease, s/p RAI, now hypothyroid   Irritable bowel syndrome Mother    COPD Mother    Colon polyps Mother    Cancer Mother        skin (not melanoma)   Kidney Stones Mother    Lung cancer Mother    Stroke Father 46   Hyperlipidemia Father    Hypertension Father    Depression Father    Diabetes Father    Thyroid disease Father        hypothyroidism   COPD Father    Heart disease Father 59       MI; s/p CABG; pacemaker   Cancer Sister 47       pancreatic cancer   Bipolar disorder Sister    Lupus Paternal Aunt    Heart disease Maternal Uncle        s/p CABG, late 60's   Cancer Maternal Grandfather        laryngeal, smoker   Multiple sclerosis Cousin     Past Surgical History:  Procedure Laterality Date   EXCISION MORTON'S NEUROMA  05/2017   right foot    PILONIDAL CYST EXCISION  25   TONSILLECTOMY  age 33   WISDOM TOOTH EXTRACTION     Social History   Occupational History   Occupation: teaches biology/ecology at [employer]    Employer: [employer]  Tobacco Use   Smoking status: Former    Packs/day: 1.50    Years: 26.00    Pack years:  39.00    Types: Cigarettes    Start date: 01/08/2005    Quit date: 05/2005    Years since quitting: 15.5   Smokeless tobacco: Former    Types: Chew   Tobacco comments:    only 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

## 2020-12-09 ENCOUNTER — Other Ambulatory Visit: Payer: Self-pay | Admitting: Rheumatology

## 2020-12-09 DIAGNOSIS — M7918 Myalgia, other site: Secondary | ICD-10-CM

## 2020-12-10 ENCOUNTER — Other Ambulatory Visit: Payer: Self-pay | Admitting: Physician Assistant

## 2020-12-10 DIAGNOSIS — M7918 Myalgia, other site: Secondary | ICD-10-CM

## 2020-12-12 ENCOUNTER — Encounter: Payer: Self-pay | Admitting: Physician Assistant

## 2020-12-12 ENCOUNTER — Ambulatory Visit: Payer: BC Managed Care – PPO | Admitting: Physician Assistant

## 2020-12-12 ENCOUNTER — Ambulatory Visit: Payer: Self-pay

## 2020-12-12 ENCOUNTER — Other Ambulatory Visit: Payer: Self-pay

## 2020-12-12 VITALS — BP 128/86 | HR 82 | Resp 17 | Ht 67.0 in | Wt 233.8 lb

## 2020-12-12 DIAGNOSIS — Z79899 Other long term (current) drug therapy: Secondary | ICD-10-CM | POA: Diagnosis not present

## 2020-12-12 DIAGNOSIS — Z8639 Personal history of other endocrine, nutritional and metabolic disease: Secondary | ICD-10-CM

## 2020-12-12 DIAGNOSIS — M25512 Pain in left shoulder: Secondary | ICD-10-CM | POA: Diagnosis not present

## 2020-12-12 DIAGNOSIS — G4710 Hypersomnia, unspecified: Secondary | ICD-10-CM

## 2020-12-12 DIAGNOSIS — M0579 Rheumatoid arthritis with rheumatoid factor of multiple sites without organ or systems involvement: Secondary | ICD-10-CM

## 2020-12-12 DIAGNOSIS — Z1589 Genetic susceptibility to other disease: Secondary | ICD-10-CM | POA: Diagnosis not present

## 2020-12-12 DIAGNOSIS — M7522 Bicipital tendinitis, left shoulder: Secondary | ICD-10-CM

## 2020-12-12 DIAGNOSIS — M7918 Myalgia, other site: Secondary | ICD-10-CM

## 2020-12-12 DIAGNOSIS — M62838 Other muscle spasm: Secondary | ICD-10-CM

## 2020-12-12 DIAGNOSIS — H169 Unspecified keratitis: Secondary | ICD-10-CM

## 2020-12-12 DIAGNOSIS — R7303 Prediabetes: Secondary | ICD-10-CM

## 2020-12-12 DIAGNOSIS — Z8679 Personal history of other diseases of the circulatory system: Secondary | ICD-10-CM

## 2020-12-12 DIAGNOSIS — M1711 Unilateral primary osteoarthritis, right knee: Secondary | ICD-10-CM

## 2020-12-12 DIAGNOSIS — R5383 Other fatigue: Secondary | ICD-10-CM

## 2020-12-12 DIAGNOSIS — G8929 Other chronic pain: Secondary | ICD-10-CM

## 2020-12-12 DIAGNOSIS — R413 Other amnesia: Secondary | ICD-10-CM

## 2020-12-12 MED ORDER — LIDOCAINE HCL 1 % IJ SOLN
1.5000 mL | INTRAMUSCULAR | Status: AC | PRN
  Administered 2020-12-12: 1.5 mL

## 2020-12-12 MED ORDER — TRIAMCINOLONE ACETONIDE 40 MG/ML IJ SUSP
40.0000 mg | INTRAMUSCULAR | Status: AC | PRN
  Administered 2020-12-12: 40 mg via INTRA_ARTICULAR

## 2020-12-12 NOTE — Patient Instructions (Addendum)
Standing Labs We placed an order today for your standing lab work.   Please have your standing labs drawn in October and every 3 months   If possible, please have your labs drawn 2 weeks prior to your appointment so that the provider can discuss your results at your appointment.  Please note that you may see your imaging and lab results in MyChart before we have reviewed them. We may be awaiting multiple results to interpret others before contacting you. Please allow our office up to 72 hours to thoroughly review all of the results before contacting the office for clarification of your results.  We have open lab daily: Monday through Thursday from 1:30-4:30 PM and Friday from 1:30-4:00 PM at the office of Dr. Pollyann Savoy, [facility].   Please be advised, all patients with office appointments requiring lab work will take precedent over walk-in lab work.  If possible, please come for your lab work on Monday and Friday afternoons, as you may experience shorter wait times. The office is located at [address], [address], [place], [place] [address] No appointment is necessary.   Labs are drawn by [facility]. Please bring your co-pay at the time of your lab draw.  You may receive a bill from [facility] for your lab work.  If you wish to have your labs drawn at another location, please call the office 24 hours in advance to send orders.  If you have any questions regarding directions or hours of operation,  please call [phone].   As a reminder, please drink plenty of water prior to coming for your lab work. Thanks!  Shoulder Exercises Ask your health care provider which exercises are safe for you. Do exercises exactly as told by your health care provider and adjust them as directed. It is normal to feel mild stretching, pulling, tightness, or discomfort as you do these exercises. Stop right away if you feel sudden pain or your pain gets worse. Do not begin these exercises until  told by your health care provider. Stretching exercises External rotation and abduction This exercise is sometimes called corner stretch. This exercise rotates your arm outward (external rotation) and moves your arm out from your body (abduction). Stand in a doorway with one of your feet slightly in front of the other. This is called a staggered stance. If you cannot reach your forearms to the door frame, stand facing a corner of a room. Choose one of the following positions as told by your health care provider: Place your hands and forearms on the door frame above your head. Place your hands and forearms on the door frame at the height of your head. Place your hands on the door frame at the height of your elbows. Slowly move your weight onto your front foot until you feel a stretch across your chest and in the front of your shoulders. Keep your head and chest upright and keep your abdominal muscles tight. Hold for __________ seconds. To release the stretch, shift your weight to your back foot. Repeat __________ times. Complete this exercise __________ times a day. Extension, standing Stand and hold a broomstick, a cane, or a similar object behind your back. Your hands should be a little wider than shoulder width apart. Your palms should face away from your back. Keeping your elbows straight and your shoulder muscles relaxed, move the stick away from your body until you feel a stretch in your shoulders (extension). Avoid shrugging your shoulders while you move the stick.  Keep your shoulder blades tucked down toward the middle of your back. Hold for __________ seconds. Slowly return to the starting position. Repeat __________ times. Complete this exercise __________ times a day. Range-of-motion exercises Pendulum  Stand near a wall or a surface that you can hold onto for balance. Bend at the waist and let your left / right arm hang straight down. Use your other arm to support you. Keep your  back straight and do not lock your knees. Relax your left / right arm and shoulder muscles, and move your hips and your trunk so your left / right arm swings freely. Your arm should swing because of the motion of your body, not because you are using your arm or shoulder muscles. Keep moving your hips and trunk so your arm swings in the following directions, as told by your health care provider: Side to side. Forward and backward. In clockwise and counterclockwise circles. Continue each motion for __________ seconds, or for as long as told by your health care provider. Slowly return to the starting position. Repeat __________ times. Complete this exercise __________ times a day. Shoulder flexion, standing  Stand and hold a broomstick, a cane, or a similar object. Place your hands a little more than shoulder width apart on the object. Your left / right hand should be palm up, and your other hand should be palm down. Keep your elbow straight and your shoulder muscles relaxed. Push the stick up with your healthy arm to raise your left / right arm in front of your body, and then over your head until you feel a stretch in your shoulder (flexion). Avoid shrugging your shoulder while you raise your arm. Keep your shoulder blade tucked down toward the middle of your back. Hold for __________ seconds. Slowly return to the starting position. Repeat __________ times. Complete this exercise __________ times a day. Shoulder abduction, standing Stand and hold a broomstick, a cane, or a similar object. Place your hands a little more than shoulder width apart on the object. Your left / right hand should be palm up, and your other hand should be palm down. Keep your elbow straight and your shoulder muscles relaxed. Push the object across your body toward your left / right side. Raise your left / right arm to the side of your body (abduction) until you feel a stretch in your shoulder. Do not raise your arm above  shoulder height unless your health care provider tells you to do that. If directed, raise your arm over your head. Avoid shrugging your shoulder while you raise your arm. Keep your shoulder blade tucked down toward the middle of your back. Hold for __________ seconds. Slowly return to the starting position. Repeat __________ times. Complete this exercise __________ times a day. Internal rotation  Place your left / right hand behind your back, palm up. Use your other hand to dangle an exercise band, a towel, or a similar object over your shoulder. Grasp the band with your left / right hand so you are holding on to both ends. Gently pull up on the band until you feel a stretch in the front of your left / right shoulder. The movement of your arm toward the center of your body is called internal rotation. Avoid shrugging your shoulder while you raise your arm. Keep your shoulder blade tucked down toward the middle of your back. Hold for __________ seconds. Release the stretch by letting go of the band and lowering your hands. Repeat __________ times.  Complete this exercise __________ times a day. Strengthening exercises External rotation  Sit in a stable chair without armrests. Secure an exercise band to a stable object at elbow height on your left / right side. Place a soft object, such as a folded towel or a small pillow, between your left / right upper arm and your body to move your elbow about 4 inches (10 cm) away from your side. Hold the end of the exercise band so it is tight and there is no slack. Keeping your elbow pressed against the soft object, slowly move your forearm out, away from your abdomen (external rotation). Keep your body steady so only your forearm moves. Hold for __________ seconds. Slowly return to the starting position. Repeat __________ times. Complete this exercise __________ times a day. Shoulder abduction  Sit in a stable chair without armrests, or stand  up. Hold a __________ weight in your left / right hand, or hold an exercise band with both hands. Start with your arms straight down and your left / right palm facing in, toward your body. Slowly lift your left / right hand out to your side (abduction). Do not lift your hand above shoulder height unless your health care provider tells you that this is safe. Keep your arms straight. Avoid shrugging your shoulder while you do this movement. Keep your shoulder blade tucked down toward the middle of your back. Hold for __________ seconds. Slowly lower your arm, and return to the starting position. Repeat __________ times. Complete this exercise __________ times a day. Shoulder extension Sit in a stable chair without armrests, or stand up. Secure an exercise band to a stable object in front of you so it is at shoulder height. Hold one end of the exercise band in each hand. Your palms should face each other. Straighten your elbows and lift your hands up to shoulder height. Step back, away from the secured end of the exercise band, until the band is tight and there is no slack. Squeeze your shoulder blades together as you pull your hands down to the sides of your thighs (extension). Stop when your hands are straight down by your sides. Do not let your hands go behind your body. Hold for __________ seconds. Slowly return to the starting position. Repeat __________ times. Complete this exercise __________ times a day. Shoulder row Sit in a stable chair without armrests, or stand up. Secure an exercise band to a stable object in front of you so it is at waist height. Hold one end of the exercise band in each hand. Position your palms so that your thumbs are facing the ceiling (neutral position). Bend each of your elbows to a 90-degree angle (right angle) and keep your upper arms at your sides. Step back until the band is tight and there is no slack. Slowly pull your elbows back behind you. Hold for  __________ seconds. Slowly return to the starting position. Repeat __________ times. Complete this exercise __________ times a day. Shoulder press-ups  Sit in a stable chair that has armrests. Sit upright, with your feet flat on the floor. Put your hands on the armrests so your elbows are bent and your fingers are pointing forward. Your hands should be about even with the sides of your body. Push down on the armrests and use your arms to lift yourself off the chair. Straighten your elbows and lift yourself up as much as you comfortably can. Move your shoulder blades down, and avoid letting your shoulders move  up toward your ears. Keep your feet on the ground. As you get stronger, your feet should support less of your body weight as you lift yourself up. Hold for __________ seconds. Slowly lower yourself back into the chair. Repeat __________ times. Complete this exercise __________ times a day. Wall push-ups  Stand so you are facing a stable wall. Your feet should be about one arm-length away from the wall. Lean forward and place your palms on the wall at shoulder height. Keep your feet flat on the floor as you bend your elbows and lean forward toward the wall. Hold for __________ seconds. Straighten your elbows to push yourself back to the starting position. Repeat __________ times. Complete this exercise __________ times a day. This information is not intended to replace advice given to you by your health care provider. Make sure you discuss any questions you have with your healthcare provider. Document Revised: 09/18/2018 Document Reviewed: 06/26/2018 Elsevier Patient Education  2022 [employer].

## 2020-12-12 NOTE — Telephone Encounter (Signed)
Next Visit: 05/28/2021  Last Visit: 12/12/2020  Dx: Myofascial pain  Current Dose per office note on 12/12/2020: Cymbalta 30 mg 1 capsule twice daily  Okay to refill Cymbalta?

## 2020-12-13 LAB — COMPLETE METABOLIC PANEL WITH GFR
AG Ratio: 1.8 (calc) (ref 1.0–2.5)
ALT: 16 U/L (ref 6–29)
AST: 14 U/L (ref 10–35)
Albumin: 4.3 g/dL (ref 3.6–5.1)
Alkaline phosphatase (APISO): 56 U/L (ref 37–153)
BUN: 16 mg/dL (ref 7–25)
CO2: 32 mmol/L (ref 20–32)
Calcium: 9.9 mg/dL (ref 8.6–10.4)
Chloride: 102 mmol/L (ref 98–110)
Creat: 0.88 mg/dL (ref 0.50–1.05)
GFR, Est African American: 85 mL/min/{1.73_m2} (ref >=60)
GFR, Est Non African American: 73 mL/min/{1.73_m2} (ref >=60)
Globulin: 2.4 g/dL (calc) (ref 1.9–3.7)
Glucose, Bld: 105 mg/dL — ABNORMAL HIGH (ref 65–99)
Potassium: 4.4 mmol/L (ref 3.5–5.3)
Sodium: 140 mmol/L (ref 135–146)
Total Bilirubin: 0.7 mg/dL (ref 0.2–1.2)
Total Protein: 6.7 g/dL (ref 6.1–8.1)

## 2020-12-13 LAB — CBC WITH DIFFERENTIAL/PLATELET
Absolute Monocytes: 593 cells/uL (ref 200–950)
Basophils Absolute: 41 cells/uL (ref 0–200)
Basophils Relative: 0.6 %
Eosinophils Absolute: 242 cells/uL (ref 15–500)
Eosinophils Relative: 3.5 %
HCT: 46.4 % — ABNORMAL HIGH (ref 35.0–45.0)
Hemoglobin: 15.3 g/dL (ref 11.7–15.5)
Lymphs Abs: 3064 cells/uL (ref 850–3900)
MCH: 31.4 pg (ref 27.0–33.0)
MCHC: 33 g/dL (ref 32.0–36.0)
MCV: 95.1 fL (ref 80.0–100.0)
MPV: 10.3 fL (ref 7.5–12.5)
Monocytes Relative: 8.6 %
Neutro Abs: 2960 cells/uL (ref 1500–7800)
Neutrophils Relative %: 42.9 %
Platelets: 299 10*3/uL (ref 140–400)
RBC: 4.88 10*6/uL (ref 3.80–5.10)
RDW: 13.1 % (ref 11.0–15.0)
Total Lymphocyte: 44.4 %
WBC: 6.9 10*3/uL (ref 3.8–10.8)

## 2020-12-13 NOTE — Progress Notes (Signed)
Glucose is 105. Rest of CMP WNL.  CBC WNL.

## 2020-12-27 ENCOUNTER — Ambulatory Visit: Payer: BC Managed Care – PPO

## 2020-12-27 ENCOUNTER — Other Ambulatory Visit: Payer: Self-pay

## 2020-12-27 DIAGNOSIS — Z23 Encounter for immunization: Secondary | ICD-10-CM | POA: Diagnosis not present

## 2020-12-28 ENCOUNTER — Ambulatory Visit: Payer: BC Managed Care – PPO | Admitting: Neurology

## 2021-01-07 DIAGNOSIS — G4733 Obstructive sleep apnea (adult) (pediatric): Secondary | ICD-10-CM | POA: Insufficient documentation

## 2021-01-07 NOTE — Patient Instructions (Addendum)
  HEALTH MAINTENANCE RECOMMENDATIONS:  It is recommended that you get at least 30 minutes of aerobic exercise at least 5 days/week (for weight loss, you may need as much as 60-90 minutes). This can be any activity that gets your heart rate up. This can be divided in 10-15 minute intervals if needed, but try and build up your endurance at least once a week.  Weight bearing exercise is also recommended twice weekly.  Eat a healthy diet with lots of vegetables, fruits and fiber.  "Colorful" foods have a lot of vitamins (ie green vegetables, tomatoes, red peppers, etc).  Limit sweet tea, regular sodas and alcoholic beverages, all of which has a lot of calories and sugar.  Up to 1 alcoholic drink daily may be beneficial for women (unless trying to lose weight, watch sugars).  Drink a lot of water.  Calcium recommendations are 1200-1500 mg daily (1500 mg for postmenopausal women or women without ovaries), and vitamin D 1000 IU daily.  This should be obtained from diet and/or supplements (vitamins), and calcium should not be taken all at once, but in divided doses.  Monthly self breast exams and yearly mammograms for women over the age of 61 is recommended.  Sunscreen of at least SPF 30 should be used on all sun-exposed parts of the skin when outside between the hours of 10 am and 4 pm (not just when at beach or pool, but even with exercise, golf, tennis, and yard work!)  Use a sunscreen that says "broad spectrum" so it covers both UVA and UVB rays, and make sure to reapply every 1-2 hours.  Remember to change the batteries in your smoke detectors when changing your clock times in the spring and fall. Carbon monoxide detectors are recommended for your home.  Use your seat belt every time you are in a car, and please drive safely and not be distracted with cell phones and texting while driving.  [facility] to schedule bone density test.  If it is normal, you won't need another for probably  10 years. If it shows thinning of the bones, we might monitor more often. Please consider scheduling a mammogram for the same day, for early detection of breast cancer.  You are past due for colon cancer screening. We discussed options including colonoscopy and Cologuard (every 3 years, if normal), and FIT testing (yearly test). We put an order in for Cologuard (feel free to double check with insurance prior to sending in, but I believe everyone is covering it).  I recommend getting the new shingles vaccine (Shingrix). Check with your insurance to verify what your out of pocket cost may be (usually covered as preventative, but better to verify to avoid any surprises, as this vaccine is expensive), and then schedule a nurse visit at our office when convenient (based on the possible side effects as discussed).   This is a series of 2 injections, spaced 2 months apart.  It doesn't have to be exactly 2 months apart (but can't be under 2 months), if that isn't feasible for your schedule, but try and get them close to 2 months (and definitely within 6 months of each other, or else the efficacy of the vaccine drops off).  [facility], [facility], [facility]--check to see who takes Electrical engineer. Or through [employer] through your job.

## 2021-01-07 NOTE — Progress Notes (Signed)
Chief Complaint  Patient presents with   Annual Exam    Fasting CPE, sees Dr. Alben Spittle at [facility] for eye exams every 6 months. She could not give urine but will try on the way out. Did not check insurance about Shingrix. Has not yet decided about colonscopy vs Cologuard. Wants to discuss chronic fatigue syndrome-has some questions. Also needs therapist recommendation.     Jill Mathews is a 55 y.o. female who presents for a complete physical.    She was last seen here in 08/2020, having flare of fibromyalgia since the beginning of the year.  She was missing work, having a lot of fatigue and fibro-fog, felt "squeezed, like in a wetsuit 4 sizes too small".  Her rheum referred her to Integr[facility] Therapy. She has been getting [place] therapy, massage. At first she couldn't tolerate dry needling, but subsequently was able to, and it really helped her L shoulder. That discomfort has improved, not PT is  more focused on R knee (see below).  At her last visit her Cymbalta dose was increased from 60 to 90mg .  She reports that Cymbalta affects her heat tolerance.  90 mg was "unmanageable".  She then changed to 30mg  BID, which was working very well, but in error got 60mg  filled (refill from rheum). She has been taking 60mg  at night, and doing okay (as long it is under 90 degrees out). Still has tender areas to touch.  She "goes down", muscles on fire, no energy, when the storms are directly overhead. Feels better than at last visit, but muscles are more tense than they should be.  She started feeling better in June.  Went on vacation, fished all week, while rushing back in a storm, had acute onset of pain R knee, felt pop.  Ligaments intact, hurt meniscus and stretched tendons.  She is wearing brace and getting PT.  Can't hike currently, only walking on flat surfaces.  In March she had also reported fatigue, unrefreshed sleep, daytime somnolence, and some memory concerns, had been very sleepy while driving.  She was  referred for sleep study, which showed mild OSA, REM-sleep dependent, and is to be started on CPAP per Dr. (sleep study done 11/2020). She started CPAP last week--she feels more rested, no longer napping during the day.  Hypertension follow-up:  Blood pressures at home are running  mid-120's/70's (higher when she is around her mother. BP Readings from Last 3 Encounters:  12/12/20 128/86  10/24/20 132/90  09/07/20 138/80   She reports compliance with benazepril and HCTZ.  Denies dizziness, chest pain, edema.  Denies side effects of medications, muscle cramps. She does report slight cough, a little more than usual recently.  Her mother smokes, and she is allergic to cats (has 2).  In the last year she has noted "jumpy legs" at night--using diet tonic water which helps.  Migraines--used to be just ophthalmic, occasionally gets headache (with aura).  Hasn't had one in a long time.  She never needed medication for headaches. Triggers seem to be dehydration and heat.   Hyperlipidemia: h/o borderline cholesterol, which improved with dietary changes. 03/2018 lipids were much higher than prior check.  She returned for repeat labs in 04/2019, and there was some improvement, still above goal. She declined statin and referral to dietician. Lipids done prior to her physical last year were up again (LDL 156). At that time she was eating red meat 4-5x/week, half and half in her coffee (5 cups/d), hadn't been eating eggs.  Current diet--eating a lot more fish, red meat just 1-2x/week. Switched to vinaigrette dressings. Not eating much cheese or eggs.  Lab Results  Component Value Date   CHOL 237 (H) 12/29/2019   HDL 51 12/29/2019   LDLCALC 156 (H) 12/29/2019   TRIG 163 (H) 12/29/2019   CHOLHDL 4.6 (H) 12/29/2019   Hypothyroidism:  Compliant with taking Synthroid 137 mcg daily, on an empty stomach, separate from her vitamins.  No hair/skin/bowel changes.  Energy is improved since starting CPAP.  No weight changes. Lab Results  Component Value Date   TSH 1.67 08/07/2020   H/o pre-diabetes. She had elevated A1c in 2016 of 6.1.  Last check was 5.9% at her physical last year, fasting glucose 108. Eats more carbs when with her mom.  Lab Results  Component Value Date   HGBA1C 5.9 (A) 01/06/2020    RA and fibromyalgia:  Under the care of Dr. Corliss Skains and PA Amada Jupiter.  She is doing well on Humira and methotrexate. Fibromyalgia as reported above, currently doing okay on  cymbalta and PT.  Obesity: Last year she had reported losing over 33# with [employer] in October, and has lost over 33# so far.  She was down to 222# 3.2 oz at her CPE in 12/2019. She has gained about 10# back, and is holding steady.  Immunization History  Administered Date(s) Administered   Influenza Inj Mdck Quad Pf 04/11/2019   Influenza Split 03/10/2012   Influenza,inj,Quad PF,6+ Mos 04/11/2019   PFIZER Comirnaty(Gray Top)Covid-19 Tri-Sucrose Vaccine 12/27/2020   PFIZER(Purple Top)SARS-COV-2 Vaccination 08/27/2019, 09/10/2019, 01/09/2020   Pneumococcal Conjugate-13 11/01/2015   Pneumococcal Polysaccharide-23 07/02/2006, 12/30/2018   Tdap 09/09/2012   Last Pap smear:  Age 89--had a very bad experience and refuses paps and pelvic exams Last mammogram: never, is now considering, but too much going on and didn't get one. Last colonoscopy: never Last DEXA: never (discussed and ordered 10/2016, never scheduled, ordered again 12/2019, not scheduled) Dentist: every 6 months Ophtho: yearly (due to meds) Exercise:  limited due to R knee injury--had to stop swimming (hurts to kick). She is walking some and PT currently  (Prior routine had been walking 2 miles daily with the dog. 3 mile walk with 21# pack every other day.  Once a week rides an e-bike 14 miles, trying to keep the assist mainly for the hills.  3-5 mile hike (10-11# hike) on Sundays)   Normal vitamin D level (36) in 05/2012   PMH, PSH, SH and FH  reviewed and updated  Outpatient Encounter Medications as of 01/08/2021  Medication Sig   aspirin 81 MG tablet Take 81 mg by mouth every other day.    benazepril (LOTENSIN) 10 MG tablet Take 1 tablet (10 mg total) by mouth daily.   Calcium-Magnesium-Vitamin D (CALCIUM MAGNESIUM PO) Take by mouth 2 (two) times a day. 1/2 pill every 3 days   DULoxetine (CYMBALTA) 60 MG capsule Take 60 mg by mouth daily.   fish oil-omega-3 fatty acids 1000 MG capsule Take 1 g by mouth 2 (two) times daily.    folic acid (FOLVITE) 1 MG tablet Take 2 tablets (2 mg total) by mouth daily.   HUMIRA PEN 40 MG/0.4ML PNKT INJECT 1 PEN UNDER THE SKIN EVERY 14 DAYS.   hydrochlorothiazide (HYDRODIURIL) 25 MG tablet Take 1 tablet (25 mg total) by mouth daily.   loratadine (CLARITIN) 10 MG tablet Take 10 mg by mouth as needed for allergies.   methotrexate 50 MG/2ML injection INJECT 0.6 ML UNDER  SKIN ONCE WEEKLY   Multiple Vitamins-Minerals (MULTIVITAMIN ADULT PO) Take by mouth daily.   SYNTHROID 137 MCG tablet TAKE 1 TABLET(137 MCG) BY MOUTH DAILY   MELATONIN PO Take by mouth as needed. (Patient not taking: Reported on 01/08/2021)   [DISCONTINUED] DULoxetine (CYMBALTA) 30 MG capsule Take 1 capsule (30 mg total) by mouth 2 (two) times daily. (Patient not taking: Reported on 01/08/2021)   [DISCONTINUED] DULoxetine (CYMBALTA) 60 MG capsule TAKE 1 CAPSULE(60 MG) BY MOUTH DAILY (Patient taking differently: Take 30 mg by mouth 2 (two) times daily.)   No facility-administered encounter medications on file as of 01/08/2021.   Allergies  Allergen Reactions   Penicillins Rash    ROS:  The patient denies anorexia, fever, headaches,  vision changes, decreased hearing, ear pain, sore throat, breast concerns, chest pain, palpitations, dizziness, syncope, dyspnea on exertion, cough, swelling, nausea, vomiting, diarrhea, constipation, abdominal pain, melena, hematochezia, indigestion/heartburn, hematuria, incontinence, dysuria,  vaginal bleeding,  discharge, odor or itch, genital lesions, numbness, tingling, weakness, tremor, suspicious skin lesions, depression, anxiety, abnormal bleeding/bruising, or enlarged lymph nodes. Postmenopausal. No longer has hot flashes. Getting sweats, she related this to cymbalta. R knee pain per HPI, Left thumb arthritis No major muscle pains currently Fatigue improved with starting CPAP.  Some insomnia, hard to quiet the mind. No longer having migraines. Dry eye   PHYSICAL EXAM:  BP 130/70   Pulse 64   Ht 5\' 7"  (1.702 m)   Wt 235 lb (106.6 kg)   LMP 01/13/2013   BMI 36.81 kg/m   Wt Readings from Last 3 Encounters:  01/08/21 235 lb (106.6 kg)  12/12/20 233 lb 12.8 oz (106.1 kg)  10/24/20 233 lb (105.7 kg)   222# 3.2 oz at her CPE in 12/2019. 247# in 10/2016  General Appearance:      Alert, cooperative, no distress, appears stated age. She declined to get into a gown, declines breast and pelvic exam  Head:      Normocephalic, without obvious abnormality, atraumatic    Eyes:      PERRL, conjunctiva/corneas clear, EOM's intact, fundi benign    Ears:      Normal TM's and external ear canals    Nose:     Not examined (wearing mask due to COVID-19 pandemic)  Throat:     Not examined (wearing mask due to COVID-19 pandemic)    Neck:     Supple, no lymphadenopathy;  thyroid: no enlargement/tenderness/nodules; no carotid bruit or JVD    Back:      Spine nontender, no curvature, ROM normal, no CVA tenderness. No trigger points  Lungs:       Clear to auscultation bilaterally without wheezes, rales or ronchi; respirations unlabored    Chest Wall:      No tenderness or deformity     Heart:      Regular rate and rhythm, S1 and S2 normal, no murmur, rub or gallop    Breast Exam:      exam refused by patient  Abdomen:       Soft, non-tender, nondistended, normoactive bowel sounds, no masses, no hepatosplenomegaly    Genitalia:      Refused by patient   Rectal:      Refused by patient   Extremities:     No  clubbing, cyanosis or edema    Pulses:     2+ and symmetric all extremities    Skin:     Skin color, texture, turgor normal, no rashes. Nevus on  nose, uniform color, unchanged. She has bruise R thigh, scabbed areas on LLE, without evidence of infection, healing  Lymph nodes:     Cervical, supraclavicular nodes normal    Neurologic:     Normal strength, sensation and gait; reflexes 2+ and symmetric throughout                     Psych:   Normal mood, affect, hygiene and grooming.   PHQ-9 score of 5 Lab Results  Component Value Date   HGBA1C 5.7 (A) 01/08/2021    Recent labs:    Chemistry      Component Value Date/Time   NA 140 12/12/2020 0831   NA 142 12/29/2019 1040   K 4.4 12/12/2020 0831   CL 102 12/12/2020 0831   CO2 32 12/12/2020 0831   BUN 16 12/12/2020 0831   BUN 17 12/29/2019 1040   CREATININE 0.88 12/12/2020 0831      Component Value Date/Time   CALCIUM 9.9 12/12/2020 0831   ALKPHOS 70 12/29/2019 1040   AST 14 12/12/2020 0831   ALT 16 12/12/2020 0831   BILITOT 0.7 12/12/2020 0831   BILITOT 0.6 12/29/2019 1040     Flu 105 (nonfasting)  Lab Results  Component Value Date   WBC 6.9 12/12/2020   HGB 15.3 12/12/2020   HCT 46.4 (H) 12/12/2020   MCV 95.1 12/12/2020   PLT 299 12/12/2020    ASSESSMENT/PLAN:  Annual physical exam - Plan: TSH, Lipid panel, Glucose, random  IFG (impaired fasting glucose) - reviewed low carb diet, recommendation for daily exercise and weight loss - Plan: Glucose, random, HgB A1c  Mixed hyperlipidemia - due for recheck. Reviewed lowfat, low cholesterol diet - Plan: Lipid panel, Lipid panel  Hypothyroidism, unspecified type - Plan: TSH, SYNTHROID 137 MCG tablet  Fibromyalgia - stable  Obstructive sleep apnea - doing well on CPAP, feeling more refreshed and less daytime somnolence  Colon cancer screening - elects to do Cologuard - Plan: Cologuard  Essential hypertension, benign - well controlled - Plan: benazepril (LOTENSIN) 10  MG tablet, hydrochlorothiazide (HYDRODIURIL) 25 MG tablet  Class 2 severe obesity due to excess calories with serious comorbidity and body mass index (BMI) of 36.0 to 36.9 in adult (HCC) - comorb--HTN, HLD, OSA, depression, pre-diabetes.  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 Cont cymbalta   Rheumatoid arthritis involving multiple sites with positive rheumatoid factor (HCC) - Doing well on current regimen per rheum   Discussed monthly self breast exams and yearly mammograms (past due); at least 30 minutes of aerobic activity at least 5 days/week, weight-bearing exercise at least 2x/week; proper sunscreen use reviewed; healthy diet, including goals of calcium and vitamin D intake and alcohol recommendations (less than or equal to 1 drink/day) reviewed; regular seatbelt use; changing batteries in smoke detectors.  Immunization recommendations discussed--continue yearly flu shots.  Shingrix recommended, risks/SE reviewed. To check insurance and schedule [place] when convenient. Colon cancer screening options were reviewed--past due.  Discussed Colonoscopy vs Cologuard, prefers Cologuard, ordered. Discussed recommendation for DEXA (given h/o frequent prednisone use in past)--order was extended from last year, and pt advised [facility] to schedule (and encouraged mammogram also). Pap/pelvic past due--refused.

## 2021-01-08 ENCOUNTER — Ambulatory Visit (INDEPENDENT_AMBULATORY_CARE_PROVIDER_SITE_OTHER): Payer: BC Managed Care – PPO | Admitting: Family Medicine

## 2021-01-08 ENCOUNTER — Other Ambulatory Visit: Payer: Self-pay

## 2021-01-08 ENCOUNTER — Encounter: Payer: Self-pay | Admitting: Family Medicine

## 2021-01-08 VITALS — BP 130/70 | HR 64 | Ht 67.0 in | Wt 235.0 lb

## 2021-01-08 DIAGNOSIS — E782 Mixed hyperlipidemia: Secondary | ICD-10-CM | POA: Diagnosis not present

## 2021-01-08 DIAGNOSIS — I10 Essential (primary) hypertension: Secondary | ICD-10-CM

## 2021-01-08 DIAGNOSIS — F329 Major depressive disorder, single episode, unspecified: Secondary | ICD-10-CM

## 2021-01-08 DIAGNOSIS — Z0000 Encounter for general adult medical examination without abnormal findings: Secondary | ICD-10-CM

## 2021-01-08 DIAGNOSIS — M797 Fibromyalgia: Secondary | ICD-10-CM | POA: Diagnosis not present

## 2021-01-08 DIAGNOSIS — M0579 Rheumatoid arthritis with rheumatoid factor of multiple sites without organ or systems involvement: Secondary | ICD-10-CM

## 2021-01-08 DIAGNOSIS — R7301 Impaired fasting glucose: Secondary | ICD-10-CM | POA: Diagnosis not present

## 2021-01-08 DIAGNOSIS — Z6836 Body mass index (BMI) 36.0-36.9, adult: Secondary | ICD-10-CM

## 2021-01-08 DIAGNOSIS — E039 Hypothyroidism, unspecified: Secondary | ICD-10-CM | POA: Diagnosis not present

## 2021-01-08 DIAGNOSIS — G4733 Obstructive sleep apnea (adult) (pediatric): Secondary | ICD-10-CM

## 2021-01-08 DIAGNOSIS — Z1211 Encounter for screening for malignant neoplasm of colon: Secondary | ICD-10-CM

## 2021-01-08 LAB — POCT GLYCOSYLATED HEMOGLOBIN (HGB A1C): Hemoglobin A1C: 5.7 % — AB (ref 4.0–5.6)

## 2021-01-08 MED ORDER — HYDROCHLOROTHIAZIDE 25 MG PO TABS: 25.0000 mg | ORAL_TABLET | Freq: Every day | ORAL | 3 refills | Status: DC

## 2021-01-08 MED ORDER — BENAZEPRIL HCL 10 MG PO TABS: 10.0000 mg | ORAL_TABLET | Freq: Every day | ORAL | 3 refills | Status: DC

## 2021-01-09 LAB — LIPID PANEL
Chol/HDL Ratio: 3.9 ratio (ref 0.0–4.4)
Cholesterol, Total: 256 mg/dL — ABNORMAL HIGH (ref 100–199)
HDL: 66 mg/dL (ref >39)
LDL Chol Calc (NIH): 165 mg/dL — ABNORMAL HIGH (ref 0–99)
Triglycerides: 139 mg/dL (ref 0–149)
VLDL Cholesterol Cal: 25 mg/dL (ref 5–40)

## 2021-01-09 LAB — TSH: TSH: 1.82 u[IU]/mL (ref 0.450–4.500)

## 2021-01-09 LAB — GLUCOSE, RANDOM: Glucose: 102 mg/dL — ABNORMAL HIGH (ref 65–99)

## 2021-01-09 MED ORDER — SYNTHROID 137 MCG PO TABS: ORAL_TABLET | ORAL | 3 refills | Status: DC

## 2021-01-13 ENCOUNTER — Other Ambulatory Visit: Payer: Self-pay | Admitting: Family Medicine

## 2021-01-13 DIAGNOSIS — I10 Essential (primary) hypertension: Secondary | ICD-10-CM

## 2021-01-24 ENCOUNTER — Other Ambulatory Visit: Payer: Self-pay | Admitting: Physician Assistant

## 2021-01-24 NOTE — Telephone Encounter (Signed)
Next Visit: 05/28/2021  Last Visit: 12/12/2020  Last Fill: 01/03/2020  Dx: Rheumatoid arthritis involving multiple sites with positive rheumatoid factor  Current Dose per office note on 12/12/2020: folic acid 2 mg po daily  Okay to refill Folic Acid?

## 2021-02-13 ENCOUNTER — Encounter: Payer: Self-pay | Admitting: Neurology

## 2021-02-13 ENCOUNTER — Other Ambulatory Visit: Payer: Self-pay

## 2021-02-13 ENCOUNTER — Ambulatory Visit: Payer: BC Managed Care – PPO | Admitting: Neurology

## 2021-02-13 VITALS — BP 137/82 | HR 63 | Ht 67.0 in | Wt 247.0 lb

## 2021-02-13 DIAGNOSIS — R5382 Chronic fatigue, unspecified: Secondary | ICD-10-CM

## 2021-02-13 DIAGNOSIS — M797 Fibromyalgia: Secondary | ICD-10-CM

## 2021-02-13 DIAGNOSIS — E6609 Other obesity due to excess calories: Secondary | ICD-10-CM

## 2021-02-13 DIAGNOSIS — Z9989 Dependence on other enabling machines and devices: Secondary | ICD-10-CM

## 2021-02-13 DIAGNOSIS — Z6836 Body mass index (BMI) 36.0-36.9, adult: Secondary | ICD-10-CM

## 2021-02-13 DIAGNOSIS — G4733 Obstructive sleep apnea (adult) (pediatric): Secondary | ICD-10-CM | POA: Diagnosis not present

## 2021-02-13 NOTE — Progress Notes (Signed)
SLEEP MEDICINE CLINIC    Provider:  Larey Seat, MD  Primary Care Physician:  Rita Ohara, [address] [place] [address]     Referring Provider:  Dr Estanislado Pandy, MD          Chief Complaint according to patient   Patient presents with:     Rm 9, alone. Here for initial CPAP f/u, pt reports CPAP has helped w sleeping through the night, only waking once to use the restroom. Has noticed more cognitive function, decrease in her chronic fatigue. Pt is under lots of stress and is having to care for her Mother, who was in the hospital, causing her to not use CPAP a few nights.             HISTORY OF PRESENT ILLNESS:  Jill Mathews is a 55 year- old  Caucasian female patient and is seen here upon referral by rheumatology on 02/13/2021  for an evaluation of severe fatigue.   Chief concern according to patient : I have the pleasure of seeing Shenee Wignall today who prefers to go by Jill Mathews.  Today is 13 February 2021 the patient had undergone a home sleep test to evaluate her apnea risk.  She presented with mild OSA according to her home sleep test from 11/20/2020 AHI was 11.8 there was mild snoring but strong REM sleep exacerbation during REM sleep the patient had 18.7 apneas per hour more moderate than mild levels.  In supine sleep there was not much of an accentuation.  This is probably also due to spending most of the sleep in supine.  So when she slept prone her AHI was only 4.3 which would be very mild apnea and if she slept on her left her AHI was 15.6 which was worse than on her back.  She did have quite significant snoring with a mean dB level of 41.  Her sleep was moderately fragmented but she did experience quite a bit of REM sleep.  So based on this ,I asked her to start with an auto titration CPAP.  Auto CPAP download is excellent, she had a couple of days in hospital at her mother's side, but compliance is still 87 % and averages 7.7 hours . The residual AHI  is 1.8/h . Average 95% pressure at 7.3 cm water. She feels she has no need for a sleep aid, the noises of the machine have been soothing .      I have the pleasure of seeing [name] today, a right -handed Caucasian female with a possible sleep disorder.  Began using this it was delivered she finally received the CPAP machine in late July or early August just before school started.  She has been a 87% compliant and 1% compliant user for the last 37% compliant user for the last 30 days she had to spend some days in the hospital at her mom's side which would have deducted from that.  She uses it on average 7.4 hours so this is a good compliance.  And her pressure settings are between 6 and 16 cmH2O her AHI is only 1.8 which is a 90% alleviation of her apnea.  She does have occasional snoring breakthroughs but these are not frequent at all her 95th percentile pressure is 7.3 cmH2O right face in the current settings and she does have no major air leakage.  Based on the study I do not need to change any settings and the patient has an Epworth  sleepiness score endorsed at 9 points which is lower than her last visit significantly lower.  She should use a mask of her choice and I would like for her to continue using the machine taking melatonin as needed.  A past medical history of  HLA B 27 positive Fibromyalgia (03/2019), Hyperlipidemia, Hypertension, Hypothyroidism Aug 02, 1999), and Rheumatoid arthritis(714.0) (04/2006).   Sleep relevant medical history: severe fatigue, achiness,  Sleepiness while driving-  During flair -ups of fibromyalgia.  Tonsillectomy in childhood.   Social history:  Patient is working as a Teacher, adult education at [employer]-  and lives in a household with her spouse, 2 cats and one dog.   The patient currently works.  Tobacco use; exposed to second hand smoke, due to her mother- she is the remaining care giver after her sister died in August 01, 2008 of pancreatic cancer. ETOH use,   Caffeine intake in form of Coffee( 1-2 cups in AM ) . Regular exercise in form of walking, swimming, fishing, PT .     Sleep habits are as follows: The patient's dinner time is between 6.30- PM. The patient goes to bed at 8.30PM and continues to sleep for 9 hours, wakes for one- two bathroom breaks. The preferred sleep position is lateral , with the support of 1-2 pillows.   Reality like dreams are reportedly  Frequent/vivid until she started on CYMBALTA>  5.30  AM is the usual rise time. The patient wakes up with an alarm.  She reports not feeling refreshed or restored in AM, with symptoms such as stiffness, joint pain,  and residual fatigue. Naps are taken infrequently,  Only when having a flair- 4-5.   Review of Systems: Out of a complete 14 system review, the patient complains of only the following symptoms, and all other reviewed systems are negative.:  Fatigue, sleepiness   How likely are you to doze in the following situations: 0 = not likely, 1 = slight chance, 2 = moderate chance, 3 = high chance   Sitting and Reading? Watching Television? Sitting inactive in a public place (theater or meeting)? As a passenger in a car for an hour without a break? Lying down in the afternoon when circumstances permit? Sitting and talking to someone? Sitting quietly after lunch without alcohol? In a car, while stopped for a few minutes in traffic?   Total = 3/ 24 points - but 18/ 24 when in a flair up !!  FSS endorsed at Aug 02, 2059 63 points.   Social History   Socioeconomic History   Marital status: Married    Spouse name: Not on file   Number of children: Not on file   Years of education: Not on file   Highest education level: Not on file  Occupational History   Occupation: teaches biology/ecology at [employer]    Employer: [employer]  Tobacco Use   Smoking status: Former    Packs/day: 1.50    Years: 26.00    Pack years: 39.00    Types: Cigarettes     Start date: 01/08/2005    Quit date: 05/2005    Years since quitting: 15.7   Smokeless tobacco: Former    Types: Chew   Tobacco comments:    only when playing softball (2 years), when they couldn't smoke (age [age])  Vaping Use   Vaping Use: Never used  Substance and Sexual Activity   Alcohol use: Yes    Alcohol/week: 1.0 standard drink    Types: 1 Glasses of  wine per week    Comment: ONCE MONTHLY   Drug use: Never   Sexual activity: Yes    Partners: Female  Other Topics Concern   Not on file  Social History Narrative   Lives with female partner Judson Roch) of 45 years--got married 06/11/13. 3 cats, 1 dog   2 cats 1 dog.   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   Social Determinants of Health   Financial Resource Strain: Not on file  Food Insecurity: Not on file  Transportation Needs: Not on file  Physical Activity: Not on file  Stress: Not on file  Social Connections: Not on file    Family History  Problem Relation Age of Onset   Heart disease Mother 37       MI, stents x 2; CABG age 53   Stroke Mother 6   Hyperlipidemia Mother    Hypertension Mother    Depression Mother    Diabetes Mother    Thyroid disease Mother        Graves disease, s/p RAI, now hypothyroid   Irritable bowel syndrome Mother    COPD Mother    Colon polyps Mother    Cancer Mother        skin (not melanoma); lung cancer 2021   Kidney Stones Mother    Lung cancer Mother    Stroke Father 44   Hyperlipidemia Father    Hypertension Father    Depression Father    Diabetes Father    Thyroid disease Father        hypothyroidism   COPD Father    Heart disease Father 74       MI; s/p CABG; pacemaker   Cancer Sister 59       pancreatic cancer   Bipolar disorder Sister    Heart disease Maternal Uncle        s/p CABG, late 60's   Lupus  Paternal Aunt    Cancer Maternal Grandfather        laryngeal, smoker   Multiple sclerosis Cousin     Past Medical History:  Diagnosis Date   Fibromyalgia 03/2019   put on cymbalta, and pain resolved   Hyperlipidemia    borderline; dietary management   Hypertension    Hypothyroidism 2001   Rheumatoid arthritis(714.0) 04/2006   Dr. 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      benazepril (LOTENSIN) 10 MG tablet Take 1 tablet (10 mg total) by mouth daily. 90 tablet 3   Calcium-Magnesium-Vitamin D (CALCIUM MAGNESIUM PO) Take by mouth 2 (two) times a day. 1/2 pill every 3 days     DULoxetine (CYMBALTA) 60 MG capsule Take 60 mg by mouth daily.     fish oil-omega-3 fatty acids 1000 MG capsule Take 1 g by mouth 2 (two) times daily.      folic acid (FOLVITE) 1 MG  tablet TAKE 2 TABLETS(2 MG) BY MOUTH DAILY 180 tablet 2   HUMIRA PEN 40 MG/0.4ML PNKT INJECT 1 PEN UNDER THE SKIN EVERY 14 DAYS. 6 each 0   hydrochlorothiazide (HYDRODIURIL) 25 MG tablet Take 1 tablet (25 mg total) by mouth daily. 90 tablet 3   loratadine (CLARITIN) 10 MG tablet Take 10 mg by mouth as needed for allergies.     MELATONIN PO Take by mouth as needed.     methotrexate 50 MG/2ML injection INJECT 0.6 ML UNDER SKIN ONCE WEEKLY 8 mL 0   Multiple Vitamins-Minerals (MULTIVITAMIN ADULT PO) Take by mouth daily.     SYNTHROID 137 MCG tablet TAKE 1 TABLET(137 MCG) BY MOUTH DAILY 90 tablet 3   No current facility-administered medications on file prior to visit.    Allergies  Allergen Reactions   Penicillins Rash    Physical exam:  Today's Vitals   02/13/21  1551  BP: 137/82  Pulse: 63  Weight: 247 lb (112 kg)  Height: 5' 7" (1.702 m)   Body mass index is 38.69 kg/m.   Wt Readings from Last 3 Encounters:  02/13/21 247 lb (112 kg)  01/08/21 235 lb (106.6 kg)  12/12/20 233 lb 12.8 oz (106.1 kg)     Ht Readings from Last 3 Encounters:  02/13/21 5' 7" (1.702 m)  01/08/21 5' 7" (1.702 m)  12/12/20 5' 7" (1.702 m)      General: The patient is awake, alert and appears not in acute distress. The patient is well groomed. Head: Normocephalic, atraumatic. Neck is supple. Mallampati 2 ,  neck circumference: 15.75 inches . Nasal airflow  patent.  Retrognathia is not seen.  Dental status:  Cardiovascular:  Regular rate and cardiac rhythm by pulse,  without distended neck veins. Respiratory: Lungs are clear to auscultation.  Skin:  With evidence of mild ankle edema, no rash. Trunk: The patient's posture is erect.   Neurologic exam : The patient is awake and alert, oriented to place and time.   Memory subjective described as intact.  Attention span & concentration ability appears normal.  Speech is fluent,  without  dysarthria, dysphonia or aphasia.  Mood and affect are appropriate.   Cranial nerves: no loss of smell or taste reported  Pupils are equal and briskly reactive to light. Funduscopic exam deferred.  Extraocular movements in vertical and horizontal planes were intact and without nystagmus. No Diplopia.  Facial motor strength is symmetric and tongue and uvula move midline.  Neck ROM : rotation, tilt and flexion extension were normal for age and shoulder shrug was symmetrical.    Motor exam:  Symmetric bulk, tone and ROM.   Normal tone without cog wheeling, symmetric grip strength .   Sensory:  Fine touch, pinprick and vibration were normal.  Proprioception tested in the upper extremities was normal.   Coordination: Rapid alternating movements in the fingers/hands were of normal speed.  The Finger-to-nose maneuver was intact  without evidence of ataxia, dysmetria or tremor.   Gait and station: Patient could rise unassisted from a seated position,. Toe and heel walk were deferred.  Deep tendon reflexes: in the upper and lower extremities are symmetric and intact.  Babinski response was deferred.     After spending a total time of 20 minutes face to face and additional time for physical and neurologic examination, review of laboratory studies,  personal review of imaging studies, reports and results of other testing and review of referral information / records as far as provided  in visit, I have established the following assessments:    1) OSA with positional component. ; Auto CPAP download is excellent, she had a couple of days in hospital at her mother's side, but compliance is still 87 % and averages 7.7 hours . The residual AHI is 1.8/h . Average 95% pressure at 7.3 cm water. She feels she has no need for a sleep aid, the noises of the machine have been soothing .  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  It is no longer necessary. CPAP has improved sleep and sleepiness.   I would like to thank Dr. Estanislado Pandy, MD,  and Rita Ohara, [address] [place],  [place] [address] for allowing me to meet with and to take care of this pleasant patient.   I plan to follow up either personally or through our NP within 12 month.    Electronically signed by: Larey Seat, MD 02/13/2021 4:16 PM  [facility] Board certified by The [employer] of Sleep Medicine and Diplomate of the [employer] of Sleep Medicine. Board certified In Neurology through the [place], Fellow of the [employer] of Neurology. Medical Director of [facility].

## 2021-02-13 NOTE — Patient Instructions (Signed)
Sleep Apnea Sleep apnea affects breathing during sleep. It causes breathing to stop for 10 seconds or more, or to become shallow. People with sleep apnea usually snoreloudly. It can also increase the risk of: Heart attack. Stroke. Being very overweight (obese). Diabetes. Heart failure. Irregular heartbeat. High blood pressure. The goal of treatment is to help you breathe normally again. What are the causes?  The most common cause of this condition is a collapsed or blocked airway. There are three kinds of sleep apnea: Obstructive sleep apnea. This is caused by a blocked or collapsed airway. Central sleep apnea. This happens when the brain does not send the right signals to the muscles that control breathing. Mixed sleep apnea. This is a combination of obstructive and central sleep apnea. What increases the risk? Being overweight. Smoking. Having a small airway. Being older. Being female. Drinking alcohol. Taking medicines to calm yourself (sedatives or tranquilizers). Having family members with the condition. Having a tongue or tonsils that are larger than normal. What are the signs or symptoms? Trouble staying asleep. Loud snoring. Headaches in the morning. Waking up gasping. Dry mouth or sore throat in the morning. Being sleepy or tired during the day. If you are sleepy or tired during the day, you may also: Not be able to focus your mind (concentrate). Forget things. Get angry a lot and have mood swings. Feel sad (depressed). Have changes in your personality. Have less interest in sex, if you are female. Be unable to have an erection, if you are female. How is this treated?  Sleeping on your side. Using a medicine to get rid of mucus in your nose (decongestant). Avoiding the use of alcohol, medicines to help you relax, or certain pain medicines (narcotics). Losing weight, if needed. Changing your diet. Quitting smoking. Using a machine to open your airway while you  sleep, such as: An oral appliance. This is a mouthpiece that shifts your lower jaw forward. A CPAP device. This device blows air through a mask when you breathe out (exhale). An EPAP device. This has valves that you put in each nostril. A BPAP device. This device blows air through a mask when you breathe in (inhale) and breathe out. Having surgery if other treatments do not work. Follow these instructions at home: Lifestyle Make changes that your doctor recommends. Eat a healthy diet. Lose weight if needed. Avoid alcohol, medicines to help you relax, and some pain medicines. Do not smoke or use any products that contain nicotine or tobacco. If you need help quitting, ask your doctor. General instructions Take over-the-counter and prescription medicines only as told by your doctor. If you were given a machine to use while you sleep, use it only as told by your doctor. If you are having surgery, make sure to tell your doctor you have sleep apnea. You may need to bring your device with you. Keep all follow-up visits. Contact a doctor if: The machine that you were given to use during sleep bothers you or does not seem to be working. You do not get better. You get worse. Get help right away if: Your chest hurts. You have trouble breathing in enough air. You have an uncomfortable feeling in your back, arms, or stomach. You have trouble talking. One side of your body feels weak. A part of your face is hanging down. These symptoms may be an emergency. Get help right away. Call your local emergency services (911 in the [place] Do not wait to see if the symptoms   will go away. Do not drive yourself to the hospital. Summary This condition affects breathing during sleep. The most common cause is a collapsed or blocked airway. The goal of treatment is to help you breathe normally while you sleep. This information is not intended to replace advice given to you by your health care provider. Make  sure you discuss any questions you have with your healthcare provider. Document Revised: 05/05/2020 Document Reviewed: 05/05/2020 Elsevier Patient Education  2022 Else[employer] Inc.  

## 2021-03-01 ENCOUNTER — Encounter: Payer: Self-pay | Admitting: Family Medicine

## 2021-03-01 ENCOUNTER — Ambulatory Visit: Payer: BC Managed Care – PPO | Admitting: Family Medicine

## 2021-03-01 ENCOUNTER — Other Ambulatory Visit: Payer: Self-pay

## 2021-03-01 VITALS — BP 128/86 | HR 64 | Temp 98.5°F | Ht 67.0 in | Wt 242.6 lb

## 2021-03-01 DIAGNOSIS — H60503 Unspecified acute noninfective otitis externa, bilateral: Secondary | ICD-10-CM | POA: Diagnosis not present

## 2021-03-01 DIAGNOSIS — Z23 Encounter for immunization: Secondary | ICD-10-CM

## 2021-03-01 MED ORDER — NEOMYCIN-POLYMYXIN-HC 3.5-10000-1 OT SOLN: 4.0000 [drp] | Freq: Three times a day (TID) | OTIC | 1 refills | Status: DC

## 2021-03-01 NOTE — Patient Instructions (Signed)
Use the drops 3-4 drops 3-4 times/day until better (at least a week). This is contributed by your scratching at your ears. This (itchy ears) can be a symptom of allergies. Please start claritin daily. The drops have antibacterial properties, as well as hydrocortisone which will help with the swelling and the itching.  You had only a small amount of wax--if your hearing isn't improving, return for recheck.  When your ear canal isn't as painful, we can then try ear lavage (but truly isn't needed currently--your symptoms are more relate to the infection than the amount of wax present).

## 2021-03-01 NOTE — Progress Notes (Signed)
Chief Complaint  Patient presents with   Ear Pain    Right ear pain since Monday, left side just slightly painful. No other symptoms. Left ear has a clear discharge that is coming out and then getting crusty. Rapid test today, negative.     She has pain and decreased hearing in the R ear.  She had an itchy, fullness feeling for a while, but got acutely worse.  Left feels a little full, notes a little clear drainage, which crusts,which makes it itch. She is scratching at both ears with Q-tips and her pinkie nails.  PMH, PSH, SH reviewed  Outpatient Encounter Medications as of 03/01/2021  Medication Sig   aspirin 81 MG tablet Take 81 mg by mouth every other day.    benazepril (LOTENSIN) 10 MG tablet Take 1 tablet (10 mg total) by mouth daily.   Calcium-Magnesium-Vitamin D (CALCIUM MAGNESIUM PO) Take by mouth 2 (two) times a day. 1/2 pill every 3 days   DULoxetine (CYMBALTA) 60 MG capsule Take 60 mg by mouth daily.   fish oil-omega-3 fatty acids 1000 MG capsule Take 1 g by mouth 2 (two) times daily.    folic acid (FOLVITE) 1 MG tablet TAKE 2 TABLETS(2 MG) BY MOUTH DAILY   HUMIRA PEN 40 MG/0.4ML PNKT INJECT 1 PEN UNDER THE SKIN EVERY 14 DAYS.   hydrochlorothiazide (HYDRODIURIL) 25 MG tablet Take 1 tablet (25 mg total) by mouth daily.   loratadine (CLARITIN) 10 MG tablet Take 10 mg by mouth as needed for allergies.   MELATONIN PO Take by mouth as needed.   methotrexate 50 MG/2ML injection INJECT 0.6 ML UNDER SKIN ONCE WEEKLY   Multiple Vitamins-Minerals (MULTIVITAMIN ADULT PO) Take by mouth daily.   neomycin-polymyxin-hydrocortisone (CORTISPORIN) OTIC solution Place 4 drops into both ears 3 (three) times daily.   SYNTHROID 137 MCG tablet TAKE 1 TABLET(137 MCG) BY MOUTH DAILY   No facility-administered encounter medications on file as of 03/01/2021.   Allergies  Allergen Reactions   Penicillins Rash    ROS: no fever, chills, headaches, URI symptoms, GI symptoms or other complaint. Moods  are good.   PHYSICAL EXAM:  BP 128/86   Pulse 64   Temp 98.5 F (36.9 C) (Oral)   Ht 5\' 7"  (1.702 m)   Wt 242 lb 9.6 oz (110 kg)   LMP 01/13/2013   BMI 38.00 kg/m   Well-appearing female, in good spirits, in no distress HEENT: conjunctiva and sclera are clear, EOMI, wearing mask R canal is very swollen, somewhat pale.  Small amount of pale yellow cerumen posteriorly only, not occluding.  Canal is narrow from swelling.  +pain with movement of external ear, and tender to touch anteriorly. L canal is more open.  There is a small amount of cerumen at the opening only.  Some clear drainage is noted.  TM is normal. Cannot fully evaluate the canal walls for any abrasion. Neck: no lymphadenopathy or mass Heart: regular rate and rhythm Lungs: clear bilaterally Neuro: alert and oriented, normal strength, gait Psych: normal mood, affect, hygiene and grooming   ASSESSMENT/PLAN:  Acute otitis externa of both ears, unspecified type - Plan: neomycin-polymyxin-hydrocortisone (CORTISPORIN) OTIC solution  Need for influenza vaccination - Plan: Flu Vaccine QUAD 6+ mos PF IM (Fluarix Quad PF)  Need for COVID-19 vaccine - Plan: 03/15/2013  Suspect component of allergies contributing to itching. Rec claritin daily. HC in cortisporin will also help with itching. If hearing not improving, return for recheck.

## 2021-03-13 ENCOUNTER — Ambulatory Visit: Payer: BC Managed Care – PPO | Admitting: Medical

## 2021-03-13 ENCOUNTER — Ambulatory Visit (HOSPITAL_COMMUNITY): Payer: Self-pay

## 2021-03-13 ENCOUNTER — Other Ambulatory Visit: Payer: Self-pay | Admitting: *Deleted

## 2021-03-13 ENCOUNTER — Other Ambulatory Visit: Payer: Self-pay

## 2021-03-13 ENCOUNTER — Encounter: Payer: Self-pay | Admitting: Medical

## 2021-03-13 VITALS — BP 148/94 | HR 68 | Temp 97.8°F | Wt 242.0 lb

## 2021-03-13 DIAGNOSIS — L089 Local infection of the skin and subcutaneous tissue, unspecified: Secondary | ICD-10-CM

## 2021-03-13 DIAGNOSIS — Z79899 Other long term (current) drug therapy: Secondary | ICD-10-CM | POA: Diagnosis not present

## 2021-03-13 DIAGNOSIS — H01004 Unspecified blepharitis left upper eyelid: Secondary | ICD-10-CM | POA: Diagnosis not present

## 2021-03-13 MED ORDER — DULOXETINE HCL 30 MG PO CPEP: 30.0000 mg | ORAL_CAPSULE | Freq: Two times a day (BID) | ORAL | 2 refills | Status: DC

## 2021-03-13 MED ORDER — MUPIROCIN 2 % EX OINT: 1.0000 "application " | TOPICAL_OINTMENT | Freq: Two times a day (BID) | CUTANEOUS | 0 refills | Status: DC

## 2021-03-13 MED ORDER — SULFAMETHOXAZOLE-TRIMETHOPRIM 800-160 MG PO TABS: 1.0000 | ORAL_TABLET | Freq: Two times a day (BID) | ORAL | 0 refills | Status: DC

## 2021-03-13 NOTE — Progress Notes (Signed)
Subjective:  Jill Mathews is a 55 y.o. female who presents for Chief Complaint  Patient presents with   Eye Problem    2 weeks ago both eyes were itchy mother gave neomycin ointment and cleared right eye up but not the left and now left eye is swollen red , closed, excessive white/yellow drainage. Itches, painful, burning sensation on eye lid and has radiated down left nostril. Has RA     Here for eye infection.  For the last 2 weeks she was having some itchy irritated eyes but her eyes started seeming to get worse.  She had been using neomycin eardrops for recent ear infection which improved the most part.  She had some leftover unopened erythromycin ophthalmic ointment.  She used this in the right eye, and the right eye did clear up over the last week but all of a sudden the left eye upper eyelid has gotten swollen red and draining some pus.  Using warm compresses.   The upper left eyelid is tender.  She called her eye doctor but they could not get her in yet.  Denies fever.  She is on high risk medication but has withheld those while this is going on.  No concern for foreign body in the eye, no recent activity that would introduce foreign body into the eye.  No other aggravating or relieving factors.    No other c/o.  The following portions of the patient's history were reviewed and updated as appropriate: allergies, current medications, past family history, past medical history, past social history, past surgical history and problem list.  ROS Otherwise as in subjective above    Objective: BP (!) 148/94 (BP Location: Right Arm, Patient Position: Sitting)   Pulse 68   Temp 97.8 F (36.6 C) (Tympanic)   Wt 242 lb (109.8 kg)   LMP 01/13/2013   SpO2 98%   BMI 37.90 kg/m   General appearance: alert, no distress, well developed, well nourished HEENT: normocephalic, sclerae anicteric, conjunctiva pink and moist, left upper eyelid is swollen with pink-red coloration and on the  medial corner there is some induration and slight purulent debris pushing out the pore There is a small area of erythema and localized swelling around opening of left nostril on the cheek nares patent, no discharge or erythema     Assessment: Encounter Diagnoses  Name Primary?   Blepharitis of left upper eyelid, unspecified type Yes   Skin infection    High risk medication use      Plan: We discussed symptoms and concerns.  I advise she put the E-Mycin ophthalmic ointment she already has over the left upper eyelid that seems to be infected.  Begin oral Bactrim antibiotic below.  Begin mupirocin ointment to the nose lesion.  If not significant improvement within 48 hours then go see eye doctor.  Hold off on methotrexate or Humira currently until much improved  Jill Mathews was seen today for eye problem.  Diagnoses and all orders for this visit:  Blepharitis of left upper eyelid, unspecified type  Skin infection  High risk medication use  Other orders -     mupirocin ointment (BACTROBAN) 2 %; Apply 1 application topically 2 (two) times daily. -     sulfamethoxazole-trimethoprim (BACTRIM DS) 800-160 MG tablet; Take 1 tablet by mouth 2 (two) times daily.   Follow up: prn

## 2021-03-13 NOTE — Telephone Encounter (Signed)
Refill request received via fax  Next Visit: 05/28/2021  Last Visit: 12/12/2020  Last Fill: 12/12/2020  Dx: Myofascial pain  Current Dose per office note on 12/12/2020: Cymbalta 30 mg 1 capsule twice daily.   Okay to refill Cymbalta?

## 2021-03-14 ENCOUNTER — Encounter: Payer: Self-pay | Admitting: *Deleted

## 2021-03-14 ENCOUNTER — Telehealth: Payer: Self-pay | Admitting: *Deleted

## 2021-03-14 NOTE — Telephone Encounter (Signed)
Patient was see yesterday for probable MRSA. She had two questions. First one she would like to know if she is contagious? Would like to go see her mother in the nursing home. Also her face is still dripping, so she was wondering is there amy way to actually confirm whether or not she has MRSA?

## 2021-04-11 ENCOUNTER — Encounter: Payer: Self-pay | Admitting: *Deleted

## 2021-04-25 ENCOUNTER — Other Ambulatory Visit: Payer: Self-pay | Admitting: Physician Assistant

## 2021-04-25 DIAGNOSIS — M0579 Rheumatoid arthritis with rheumatoid factor of multiple sites without organ or systems involvement: Secondary | ICD-10-CM

## 2021-04-25 NOTE — Telephone Encounter (Signed)
Next Visit: 05/28/2021  Last Visit: 12/12/2020  Last Fill: 09/11/2020  TM:BPJPETKKOE arthritis involving multiple sites with positive rheumatoid factor   Current Dose per office note 12/12/2020:  Humira 40 mg subcutaneous injections every 14 days  Labs: 12/08/2020 Glucose is 105. Rest of CMP WNL.  CBC WNL.  TB Gold: 06/12/2020 Neg    Left message to advise patient she is due to update labs.   Okay to refill Humira?

## 2021-05-14 NOTE — Progress Notes (Deleted)
Office Visit Note  [name]             Date of Birth: 11-13-1965           MRN: [id number]             PCP: Rita Ohara, MD Referring: Rita Ohara, MD Visit Date: 05/28/2021 Occupation: '@GUAROCC' @  Subjective:  No chief complaint on file.   History of Present Illness: Jill Mathews is a 55 y.o. female ***   Activities of Daily Living:  Patient reports morning stiffness for *** {minute/hour:19697}.   Patient {ACTIONS;DENIES/REPORTS:21021675::"Denies"} nocturnal pain.  Difficulty dressing/grooming: {ACTIONS;DENIES/REPORTS:21021675::"Denies"} Difficulty climbing stairs: {ACTIONS;DENIES/REPORTS:21021675::"Denies"} Difficulty getting out of chair: {ACTIONS;DENIES/REPORTS:21021675::"Denies"} Difficulty using hands for taps, buttons, cutlery, and/or writing: {ACTIONS;DENIES/REPORTS:21021675::"Denies"}  No Rheumatology ROS completed.   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 Estanislado Pandy manages   Sleep apnea    per patient, dx by neuro    Family History  Problem Relation Age of Onset   Heart disease Mother 103       MI, stents x 2; CABG age 86   Stroke Mother 50   Hyperlipidemia Mother    Hypertension Mother    Depression Mother    Diabetes Mother    Thyroid disease Mother        Sindhu Nguyen disease, s/p RAI, now hypothyroid   Irritable bowel syndrome Mother    COPD Mother    Colon polyps Mother    Cancer Mother        skin (not melanoma); lung cancer 2021   Kidney Stones Mother    Lung cancer Mother    Stroke Father 26   Hyperlipidemia Father    Hypertension Father    Depression Father    Diabetes Father    Thyroid disease Father        hypothyroidism   COPD Father    Heart disease Father 104       MI; s/p CABG; pacemaker   Cancer Sister 47       pancreatic cancer   Bipolar disorder Sister    Heart disease Maternal Uncle        s/p CABG, late 60's   Lupus Paternal Aunt    Cancer Maternal Grandfather        laryngeal, smoker   Multiple sclerosis Cousin    Past Surgical History:  Procedure Laterality Date   EXCISION MORTON'S NEUROMA  05/2017   right foot    PILONIDAL CYST  EXCISION  43   TONSILLECTOMY  age 70   WISDOM TOOTH EXTRACTION     Social History   Social History Narrative   Lives with female partner Judson Roch) of 76 years--got married 06/11/13. 3 cats, 1 dog   2 cats 1 dog.   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   Immunization History  Administered Date(s) Administered   Influenza Inj Mdck Quad Pf 04/11/2019   Influenza Split 03/10/2012   Influenza,inj,Quad PF,6+ Mos 04/11/2019, 03/01/2021   Influenza-Unspecified 03/10/2020   PFIZER Comirnaty(Gray Top)Covid-19 Tri-Sucrose Vaccine 12/27/2020    PFIZER(Purple Top)SARS-COV-2 Vaccination 08/27/2019, 09/10/2019, 01/09/2020   Pfizer Covid-19 Vaccine Bivalent Booster 43yr & up 03/01/2021   Pneumococcal Conjugate-13 11/01/2015   Pneumococcal Polysaccharide-23 07/02/2006, 12/30/2018   Tdap 09/09/2012     Objective: Vital Signs: LMP 01/13/2013    Physical Exam   Musculoskeletal Exam: ***  CDAI Exam: CDAI Score: -- Patient Global: --; Provider Global: -- Swollen: --; Tender: -- Joint Exam 05/28/2021   No joint exam has been documented for this visit   There is currently no information documented on the homunculus. Go to the Rheumatology activity and complete the homunculus joint exam.  Investigation: No additional findings.  Imaging: No results found.  Recent Labs: Lab Results  Component Value Date   WBC 6.9 12/12/2020   HGB 15.3 12/12/2020   PLT 299 12/12/2020   NA 140 12/12/2020   K 4.4 12/12/2020   CL 102 12/12/2020   CO2 32 12/12/2020   GLUCOSE 102 (H) 01/08/2021   BUN 16 12/12/2020   CREATININE 0.88 12/12/2020   BILITOT 0.7 12/12/2020   ALKPHOS 70 12/29/2019   AST 14 12/12/2020   ALT 16 12/12/2020   PROT 6.7 12/12/2020   ALBUMIN 4.2 12/29/2019   CALCIUM 9.9 12/12/2020   GFRAA 85 12/12/2020   QFTBGOLD NEGATIVE 05/05/2017   QFTBGOLDPLUS NEGATIVE 06/12/2020    Speciality Comments: Prior therapy includes: Enbrel (inadequate response), Plaquenil (inadequate response)   Procedures:  No procedures performed Allergies: Penicillins   Assessment / Plan:     Visit Diagnoses: No diagnosis found.  Orders: No orders of the defined types were placed in this encounter.  No orders of the defined types were placed in this encounter.   Face-to-face time spent with patient was *** minutes. Greater than 50% of time was spent in counseling and coordination of care.  Follow-Up Instructions: No follow-ups on file.   MEarnestine Mealing CMA  Note - This record has been created using DEditor, commissioning  Chart  creation errors have been sought, but may not always  have been located. Such creation errors do not reflect on  the standard of medical care.

## 2021-05-15 ENCOUNTER — Encounter: Payer: Self-pay | Admitting: Family Medicine

## 2021-05-15 ENCOUNTER — Ambulatory Visit: Payer: BC Managed Care – PPO | Admitting: Family Medicine

## 2021-05-15 ENCOUNTER — Other Ambulatory Visit: Payer: Self-pay

## 2021-05-15 VITALS — BP 112/76 | HR 62 | Temp 98.0°F | Wt 247.8 lb

## 2021-05-15 DIAGNOSIS — Z79899 Other long term (current) drug therapy: Secondary | ICD-10-CM | POA: Diagnosis not present

## 2021-05-15 DIAGNOSIS — M0579 Rheumatoid arthritis with rheumatoid factor of multiple sites without organ or systems involvement: Secondary | ICD-10-CM | POA: Diagnosis not present

## 2021-05-15 DIAGNOSIS — L089 Local infection of the skin and subcutaneous tissue, unspecified: Secondary | ICD-10-CM | POA: Diagnosis not present

## 2021-05-15 NOTE — Progress Notes (Signed)
   Subjective:    Patient ID: Jill Mathews, female    DOB: [birth year], 55 y.o.   MRN: [id number]  HPI She is here for consult concerning abdominal abscess.  She states that it started several days ago but did start to drain on its own but because of her underlying use of DMARDs she has concerns.   Review of Systems     Objective:   Physical Exam Alert and in no distress.  Exam of her abdomen does show a 1 x 2 cm area of erythema with minimal induration and no drainage.       Assessment & Plan:  Skin infection  High risk medication use  Rheumatoid arthritis involving multiple sites with positive rheumatoid factor (HCC) I explained that she probably did have an abscess that drained on its own.  At this point nothing further needs to be done other than use warm soaks on the to allow it to heal better.  Discussed treatment of  this in the future in terms of coming in quickly in case we need to do an I&D.  Also discussed the use of Lever 2000 or Dial soap as an antiseptic and possibly even Hibiclens if this becomes more of an issue.

## 2021-05-28 ENCOUNTER — Ambulatory Visit: Payer: BC Managed Care – PPO | Admitting: Rheumatology

## 2021-05-28 ENCOUNTER — Other Ambulatory Visit: Payer: Self-pay

## 2021-05-28 DIAGNOSIS — Z79899 Other long term (current) drug therapy: Secondary | ICD-10-CM

## 2021-05-28 DIAGNOSIS — M1711 Unilateral primary osteoarthritis, right knee: Secondary | ICD-10-CM

## 2021-05-28 DIAGNOSIS — Z8679 Personal history of other diseases of the circulatory system: Secondary | ICD-10-CM

## 2021-05-28 DIAGNOSIS — Z1589 Genetic susceptibility to other disease: Secondary | ICD-10-CM

## 2021-05-28 DIAGNOSIS — M0579 Rheumatoid arthritis with rheumatoid factor of multiple sites without organ or systems involvement: Secondary | ICD-10-CM

## 2021-05-28 DIAGNOSIS — M7522 Bicipital tendinitis, left shoulder: Secondary | ICD-10-CM

## 2021-05-28 DIAGNOSIS — G4710 Hypersomnia, unspecified: Secondary | ICD-10-CM

## 2021-05-28 DIAGNOSIS — R5383 Other fatigue: Secondary | ICD-10-CM

## 2021-05-28 DIAGNOSIS — M62838 Other muscle spasm: Secondary | ICD-10-CM

## 2021-05-28 DIAGNOSIS — M7918 Myalgia, other site: Secondary | ICD-10-CM

## 2021-05-28 DIAGNOSIS — Z8639 Personal history of other endocrine, nutritional and metabolic disease: Secondary | ICD-10-CM

## 2021-05-28 DIAGNOSIS — H169 Unspecified keratitis: Secondary | ICD-10-CM

## 2021-05-28 DIAGNOSIS — R413 Other amnesia: Secondary | ICD-10-CM

## 2021-05-28 DIAGNOSIS — R7303 Prediabetes: Secondary | ICD-10-CM

## 2021-05-30 ENCOUNTER — Telehealth: Payer: Self-pay | Admitting: Pharmacist

## 2021-05-30 NOTE — Telephone Encounter (Signed)
Received fax from [facility] for PA renewal for patient's Humira. Pages cut off after page 6 (supposed to be total of 8 pages). Called [facility] requesting PA form be refaxed  Phone: [phone]  Faxed PA renewal forms and most recent OV notes  PA # [id number]  Fax: [phone] Phone: [phone]  Chesley Mires, PharmD, MPH, BCPS Clinical Pharmacist (Rheumatology and Pulmonology)

## 2021-05-31 NOTE — Telephone Encounter (Signed)
Received fax from [facility] stating that PA is not required until after 06/09/21. Can try to resubmit after 06/10/21.  Chesley Mires, PharmD, MPH, BCPS Clinical Pharmacist (Rheumatology and Pulmonology)

## 2021-06-12 ENCOUNTER — Telehealth: Payer: Self-pay | Admitting: *Deleted

## 2021-06-12 NOTE — Telephone Encounter (Signed)
Submitted a Prior Authorization request to [facility] for HUMIRA via CoverMyMeds. Will update once we receive a response.  Key: Baxter Hire, PharmD, MPH, BCPS Clinical Pharmacist (Rheumatology and Pulmonology)

## 2021-06-12 NOTE — Telephone Encounter (Signed)
[facility] contacted the the office regarding the PA for Humira. They are unable to complete the PA until they have received a return call to discuss.  Call back number [phone] Reference Number: [id number]

## 2021-06-13 NOTE — Telephone Encounter (Signed)
Received notification from [facility] regarding a prior authorization for HUMIRA. Authorization has been APPROVED from 06/13/21 to 06/13/22.   Patient can continue to fill through [facility]: [phone]  Authorization # [id number]  Chesley Mires, PharmD, MPH, BCPS Clinical Pharmacist (Rheumatology and Pulmonology)

## 2021-06-13 NOTE — Telephone Encounter (Signed)
Received fax regarding Humira approval. Closing encounter

## 2021-06-25 ENCOUNTER — Other Ambulatory Visit: Payer: Self-pay | Admitting: Physician Assistant

## 2021-07-12 ENCOUNTER — Ambulatory Visit: Payer: BC Managed Care – PPO | Admitting: Physician Assistant

## 2021-07-12 ENCOUNTER — Encounter: Payer: Self-pay | Admitting: Physician Assistant

## 2021-07-12 ENCOUNTER — Other Ambulatory Visit: Payer: Self-pay

## 2021-07-12 VITALS — BP 128/85 | HR 61 | Ht 67.0 in | Wt 239.8 lb

## 2021-07-12 DIAGNOSIS — M62838 Other muscle spasm: Secondary | ICD-10-CM | POA: Diagnosis not present

## 2021-07-12 DIAGNOSIS — R413 Other amnesia: Secondary | ICD-10-CM

## 2021-07-12 DIAGNOSIS — G4710 Hypersomnia, unspecified: Secondary | ICD-10-CM

## 2021-07-12 DIAGNOSIS — M0579 Rheumatoid arthritis with rheumatoid factor of multiple sites without organ or systems involvement: Secondary | ICD-10-CM

## 2021-07-12 DIAGNOSIS — Z8679 Personal history of other diseases of the circulatory system: Secondary | ICD-10-CM

## 2021-07-12 DIAGNOSIS — Z111 Encounter for screening for respiratory tuberculosis: Secondary | ICD-10-CM

## 2021-07-12 DIAGNOSIS — Z1589 Genetic susceptibility to other disease: Secondary | ICD-10-CM

## 2021-07-12 DIAGNOSIS — G5761 Lesion of plantar nerve, right lower limb: Secondary | ICD-10-CM

## 2021-07-12 DIAGNOSIS — M7918 Myalgia, other site: Secondary | ICD-10-CM

## 2021-07-12 DIAGNOSIS — M1711 Unilateral primary osteoarthritis, right knee: Secondary | ICD-10-CM

## 2021-07-12 DIAGNOSIS — R7303 Prediabetes: Secondary | ICD-10-CM

## 2021-07-12 DIAGNOSIS — Z79899 Other long term (current) drug therapy: Secondary | ICD-10-CM | POA: Diagnosis not present

## 2021-07-12 DIAGNOSIS — M7522 Bicipital tendinitis, left shoulder: Secondary | ICD-10-CM

## 2021-07-12 DIAGNOSIS — Z8639 Personal history of other endocrine, nutritional and metabolic disease: Secondary | ICD-10-CM

## 2021-07-12 DIAGNOSIS — R5383 Other fatigue: Secondary | ICD-10-CM

## 2021-07-12 MED ORDER — PREDNISONE 5 MG PO TABS: ORAL_TABLET | ORAL | 0 refills | Status: DC

## 2021-07-12 MED ORDER — TRIAMCINOLONE ACETONIDE 40 MG/ML IJ SUSP
10.0000 mg | INTRAMUSCULAR | Status: AC | PRN
  Administered 2021-07-12: 10 mg via INTRAMUSCULAR

## 2021-07-12 MED ORDER — LIDOCAINE HCL 1 % IJ SOLN
0.5000 mL | INTRAMUSCULAR | Status: AC | PRN
  Administered 2021-07-12: .5 mL

## 2021-07-12 MED ORDER — METHOCARBAMOL 500 MG PO TABS: 500.0000 mg | ORAL_TABLET | Freq: Two times a day (BID) | ORAL | 0 refills | Status: DC

## 2021-07-12 MED ORDER — HUMIRA (2 PEN) 40 MG/0.4ML ~~LOC~~ AJKT: AUTO-INJECTOR | SUBCUTANEOUS | 0 refills | Status: DC

## 2021-07-12 NOTE — Telephone Encounter (Signed)
Humira refill pended, please review and send to the pharmacy. Thanks!

## 2021-07-12 NOTE — Progress Notes (Signed)
Office Visit Note  [name]             Date of Birth: Jan 15, 1966           MRN: [id number]             PCP: Jill Ohara, MD Referring: Jill Ohara, MD Visit Date: 07/12/2021 Occupation: '@GUAROCC' @  Subjective:  Generalized pain   History of Present Illness: Jill Mathews is a 56 y.o. female with history of seropositive rheumatoid arthritis and myofascial pain.  Patient reports that she was diagnosed with MRSA in October 2022.  She states that she has had several gaps in therapy while on Humira and methotrexate due to recurrent infections.  She has been using Bactroban topically as needed and has been following up closely with her PCP.  her last dose of Humira was on Sunday.  She has been out of the prescription for methotrexate for about 2 weeks.  She started to have a rheumatoid arthritis flare yesterday involving multiple joints.  She is also having a fibromyalgia flare.  She is experiencing significant arthralgias and myalgias at this time.  She also has increased fatigue.  She states that she has been under tremendous amount of stress since her mother fall back in August 2022.  Her mom has been in and out of the hospital in a skilled nursing facility.  She has tried meditation as well as painting with water colors to try to manage her stress.  She states that her fibromyalgia flares and she is under increased stress.  She continues to use a CPAP at night which typically makes her feel more restored in the morning.    Activities of Daily Living:  Patient reports morning stiffness for all day. Patient Reports nocturnal pain.  Difficulty dressing/grooming: Reports Difficulty climbing stairs: Reports Difficulty getting out of chair: Reports Difficulty using hands for taps, buttons, cutlery, and/or writing: Reports  Review of Systems  Constitutional:  Positive for fatigue.  HENT:  Positive for mouth sores. Negative for mouth dryness and nose dryness.   Eyes:  Negative  for pain, visual disturbance and dryness.  Respiratory:  Positive for difficulty breathing. Negative for cough, hemoptysis and shortness of breath.   Cardiovascular:  Negative for chest pain, palpitations, hypertension and swelling in legs/feet.  Gastrointestinal:  Negative for blood in stool, constipation and diarrhea.  Endocrine: Negative for increased urination.  Genitourinary:  Negative for painful urination.  Musculoskeletal:  Positive for joint pain, joint pain, myalgias, morning stiffness, muscle tenderness and myalgias. Negative for joint swelling and muscle weakness.  Skin:  Negative for color change, pallor, rash, hair loss, nodules/bumps, skin tightness, ulcers and sensitivity to sunlight.  Allergic/Immunologic: Positive for susceptible to infections.  Neurological:  Positive for headaches and weakness. Negative for dizziness and numbness.  Hematological:  Negative for swollen glands.  Psychiatric/Behavioral:  Positive for sleep disturbance. Negative for depressed mood. The patient is not nervous/anxious.    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 Estanislado Pandy manages   Sleep apnea    per patient, dx by neuro    Family History  Problem Relation Age of Onset   Heart disease Mother 43       MI, stents x 2; CABG age 34   Stroke Mother 70   Hyperlipidemia Mother    Hypertension Mother    Depression Mother    Diabetes Mother    Thyroid disease Mother        Graves disease, s/p RAI, now hypothyroid   Irritable bowel syndrome Mother    COPD Mother    Colon polyps Mother    Cancer Mother        skin (not melanoma); lung cancer 2021   Kidney Stones Mother    Lung cancer Mother    Stroke Father 76   Hyperlipidemia Father    Hypertension Father    Depression Father    Diabetes Father    Thyroid disease Father        hypothyroidism   COPD Father    Heart disease Father 65       MI; s/p CABG; pacemaker   Cancer Sister 67       pancreatic cancer   Bipolar disorder Sister    Heart disease Maternal Uncle        s/p CABG, late 60's   Lupus Paternal Aunt    Cancer Maternal Grandfather        laryngeal, smoker   Multiple sclerosis Cousin    Past Surgical History:  Procedure Laterality Date   EXCISION MORTON'S NEUROMA  05/2017   right foot    PILONIDAL CYST EXCISION  75   TONSILLECTOMY  age 40   WISDOM TOOTH EXTRACTION     Social History   Social History Narrative   Lives with female partner Jill Mathews) of 53 years--got married 06/11/13. 3 cats, 1 dog   2 cats 1 dog.   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   Immunization History  Administered Date(s) Administered   Influenza Inj Mdck Quad Pf 04/11/2019   Influenza Split 03/10/2012    Influenza,inj,Quad PF,6+ Mos 04/11/2019, 03/01/2021   Influenza-Unspecified 03/10/2020   PFIZER Comirnaty(Gray Top)Covid-19 Tri-Sucrose Vaccine 12/27/2020   PFIZER(Purple Top)SARS-COV-2 Vaccination 08/27/2019, 09/10/2019, 01/09/2020   Pfizer Covid-19 Vaccine Bivalent Booster 28yr & up 03/01/2021   Pneumococcal Conjugate-13 11/01/2015   Pneumococcal Polysaccharide-23 07/02/2006, 12/30/2018   Tdap 09/09/2012     Objective: Vital Signs: BP 128/85 (BP Location: Left Arm, Patient Position: Sitting, Cuff Size: Large)    Pulse 61    Ht '5\' 7"'  (1.702 m)    Wt 239 lb 12.8 oz (108.8 kg)    LMP 01/13/2013    BMI 37.56 kg/m    Physical Exam Vitals and nursing note reviewed.  Constitutional:      Appearance:  She is well-developed.  HENT:     Head: Normocephalic and atraumatic.  Eyes:     Conjunctiva/sclera: Conjunctivae normal.  Pulmonary:     Effort: Pulmonary effort is normal.  Abdominal:     Palpations: Abdomen is soft.  Musculoskeletal:     Cervical back: Normal range of motion.  Skin:    General: Skin is warm and dry.     Capillary Refill: Capillary refill takes less than 2 seconds.  Neurological:     Mental Status: She is alert and oriented to person, place, and time.  Psychiatric:        Behavior: Behavior normal.     Musculoskeletal Exam: Generalized hyperalgesia and positive tender points. C-spine has slightly limited ROM.  Trapezius muscle tension and tenderness bilaterally.  Shoulder joints, elbow joints, wrist joints, MCPs, PIPs, and DIPs good ROM with no synovitis.  Tenderness over the right wrist joint and right first and second MCPs and first PIP joint.  Tenderness over the left second through fourth MCP joints.  Complete fist formation bilaterally. Hip joints, knee joints, and ankle joints have good ROM with no discomfort.  No warmth or effusion of knee joints.  Tenderness over both ankle joints noted.  CDAI Exam: CDAI Score: 8.2  Patient Global: 6 mm; Provider Global: 6  mm Swollen: 0 ; Tender: 9  Joint Exam 07/12/2021      Right  Left  Wrist   Tender     MCP 1   Tender     MCP 2   Tender   Tender  MCP 3      Tender  MCP 4      Tender  IP   Tender     Ankle   Tender   Tender     Investigation: No additional findings.  Imaging: No results found.  Recent Labs: Lab Results  Component Value Date   WBC 6.9 12/12/2020   HGB 15.3 12/12/2020   PLT 299 12/12/2020   NA 140 12/12/2020   K 4.4 12/12/2020   CL 102 12/12/2020   CO2 32 12/12/2020   GLUCOSE 102 (H) 01/08/2021   BUN 16 12/12/2020   CREATININE 0.88 12/12/2020   BILITOT 0.7 12/12/2020   ALKPHOS 70 12/29/2019   AST 14 12/12/2020   ALT 16 12/12/2020   PROT 6.7 12/12/2020   ALBUMIN 4.2 12/29/2019   CALCIUM 9.9 12/12/2020   GFRAA 85 12/12/2020   QFTBGOLD NEGATIVE 05/05/2017   QFTBGOLDPLUS NEGATIVE 06/12/2020    Speciality Comments: Prior therapy includes: Enbrel (inadequate response), Plaquenil (inadequate response)   Procedures:  Trigger Point Inj  Date/Time: 07/12/2021 10:24 AM Performed by: Ofilia Neas, PA-C Authorized by: Ofilia Neas, PA-C   Consent Given by:  Patient Site marked: the procedure site was marked   Timeout: prior to procedure the correct patient, procedure, and site was verified   Indications:  Pain Total # of Trigger Points:  2 Location: neck   Needle Size:  27 G Approach:  Dorsal Medications #1:  0.5 mL lidocaine 1 %; 10 mg triamcinolone acetonide 40 MG/ML Medications #2:  0.5 mL lidocaine 1 %; 10 mg triamcinolone acetonide 40 MG/ML Patient tolerance:  Patient tolerated the procedure well with no immediate complications Allergies: Penicillins   Assessment / Plan:     Visit Diagnoses: Rheumatoid arthritis involving multiple sites with positive rheumatoid factor ([place]):  +RF,+ anti-CCP: She presents today with increased joint pain, stiffness, and intermittent inflammation.  She started to have a rheumatoid arthritis  and fibromyalgia flare yesterday.   Her pain has been most severe in both wrists, both hands, and her ankle joints.  She is having generalized myalgias secondary to fibromyalgia as well. The patient reports that she has had several gaps in therapy while on Humira and methotrexate since October due to being diagnosed with MRSA.  She has had several recurrences of MRSA since being initially diagnosed.  She has been following up with her PCP and has been using Bactroban topically as needed.  She does not have any open or nonhealing wounds at this time.  Discussed the risk of immunosuppression while on Humira and methotrexate.  Discussed that if she continues to have recurrent infections we will have to switch her medications.  She is willing to try holding methotrexate going forward to see if she has less symptoms.  She would like to remain on Humira as prescribed.  Discussed that if she has recurrent infections we will have to try spacing Humira or switch to another medication option.  Of note she has tried Plaquenil in the past but had an inadequate response.   A prednisone taper starting at 20 mg tapering by 5 mg every 4 days was sent to the pharmacy.  She was advised to notify us if she continues to have increased joint pain and inflammation.  A work note was provided to the patient to return on Monday.  Discussed the importance of stress management and good sleep hygiene.  She was also advised to monitor for infections closely.  She will follow-up in the office in 2 months and we will reassess at that time.  High risk medication use -Humira 40 mg subcutaneous injections every 14 days.  Advised to hold methotrexate due to recurrent infections.  CBC and CMP drawn on 12/12/2020.  She is overdue to update lab work today.  Orders for CBC and CMP were released.  TB Gold negative on 06/12/2020.  Order for TB gold released today.  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  She has had several gaps in therapy on Humira and methotrexate during these infections.  She has no open or nonhealing wound at this time.  Her last dose of Humira was on Sunday.  She has been out of methotrexate for about 2 weeks due to requiring refills and updated lab work.  The plan is to stay on Humira as monotherapy to try to lower her risks of immunosuppression.  Discussed that if she continues to have recurrent infections we will try to space Humira or we will need to discuss other treatment options.  She voiced understanding.  She was advised to monitor for infections closely.  Screening for tuberculosis - Order for TB gold released today. Plan: QuantiFERON-TB Gold Plus  HLA B27 (HLA B27 positive)  Bicipital tendinitis, left shoulder: Improved.  Myofascial pain: She is currently having a flare of myofascial pain.  She has generalized myalgias and muscle tenderness.  Generalized hyperalgesia and positive tender points were noted on examination today.  She is also experiencing significant fatigue and has had interrupted sleep at night due to nocturnal pain.  She has been under tremendous amount of stress recently since her mother has been in and out of the hospital.  She has also been stressed with work. Discussed the importance of stress management to try to prevent more frequent flares.  Also discussed the importance of regular exercise and good  sleep hygiene. A prescription for methocarbamol 500 mg twice daily as needed was sent to the pharmacy to alleviate her symptoms from the current flare.  Trapezius muscle spasm: She presents today with trapezius muscle tension and tenderness bilaterally.  She has been experiencing muscle spasms intermittently.  Trigger point injections were performed today as requested by the patient.  She tolerated the procedures well.  Procedure notes were completed above.  Aftercare was discussed.  She was given a  prescription for methocarbamol 500 mg twice daily as needed for muscle spasms.  Primary osteoarthritis of right knee: She has good range of motion of the right knee joint on examination today.  No warmth or effusion was noted.  Morton's neuroma of right foot  Other medical conditions are listed as follows:   Hypersomnolence  Other fatigue  Memory loss  History of hyperlipidemia  Prediabetes  History of hypothyroidism  History of hypertension: BP was 128/85 today in the office.   Orders: Orders Placed This Encounter  Procedures   Trigger Point Inj   QuantiFERON-TB Gold Plus   CBC with Differential/Platelet   COMPLETE METABOLIC PANEL WITH GFR   Meds ordered this encounter  Medications   predniSONE (DELTASONE) 5 MG tablet    Sig: Take 4 tablets by mouth daily x4 days, 3 tablets daily x4 days, 2 tablets daily x4 days, 1 tablet daily x4 days.    Dispense:  40 tablet    Refill:  0   methocarbamol (ROBAXIN) 500 MG tablet    Sig: Take 1 tablet (500 mg total) by mouth 2 (two) times daily.    Dispense:  60 tablet    Refill:  0      Follow-Up Instructions: Return in 3 months (on 10/09/2021) for Rheumatoid arthritis.   Ofilia Neas, PA-C  Note - This record has been created using Dragon software.  Chart creation errors have been sought, but may not always  have been located. Such creation errors do not reflect on  the standard of medical care.

## 2021-07-13 NOTE — Progress Notes (Signed)
Glucose is 110.  Rest of CMP WNL.  CBC WNL.

## 2021-07-17 LAB — COMPLETE METABOLIC PANEL WITH GFR
AG Ratio: 1.8 (calc) (ref 1.0–2.5)
ALT: 12 U/L (ref 6–29)
AST: 14 U/L (ref 10–35)
Albumin: 4.2 g/dL (ref 3.6–5.1)
Alkaline phosphatase (APISO): 61 U/L (ref 37–153)
BUN: 19 mg/dL (ref 7–25)
CO2: 27 mmol/L (ref 20–32)
Calcium: 9.9 mg/dL (ref 8.6–10.4)
Chloride: 106 mmol/L (ref 98–110)
Creat: 0.79 mg/dL (ref 0.50–1.03)
Globulin: 2.3 g/dL (calc) (ref 1.9–3.7)
Glucose, Bld: 110 mg/dL — ABNORMAL HIGH (ref 65–99)
Potassium: 4.2 mmol/L (ref 3.5–5.3)
Sodium: 140 mmol/L (ref 135–146)
Total Bilirubin: 0.5 mg/dL (ref 0.2–1.2)
Total Protein: 6.5 g/dL (ref 6.1–8.1)
eGFR: 87 mL/min/{1.73_m2} (ref >=60)

## 2021-07-17 LAB — CBC WITH DIFFERENTIAL/PLATELET
Absolute Monocytes: 513 cells/uL (ref 200–950)
Basophils Absolute: 40 cells/uL (ref 0–200)
Basophils Relative: 0.7 %
Eosinophils Absolute: 222 cells/uL (ref 15–500)
Eosinophils Relative: 3.9 %
HCT: 42.9 % (ref 35.0–45.0)
Hemoglobin: 14.4 g/dL (ref 11.7–15.5)
Lymphs Abs: 2485 cells/uL (ref 850–3900)
MCH: 31.2 pg (ref 27.0–33.0)
MCHC: 33.6 g/dL (ref 32.0–36.0)
MCV: 93.1 fL (ref 80.0–100.0)
MPV: 10.9 fL (ref 7.5–12.5)
Monocytes Relative: 9 %
Neutro Abs: 2440 cells/uL (ref 1500–7800)
Neutrophils Relative %: 42.8 %
Platelets: 261 10*3/uL (ref 140–400)
RBC: 4.61 10*6/uL (ref 3.80–5.10)
RDW: 12.9 % (ref 11.0–15.0)
Total Lymphocyte: 43.6 %
WBC: 5.7 10*3/uL (ref 3.8–10.8)

## 2021-07-17 LAB — QUANTIFERON-TB GOLD PLUS
Mitogen-NIL: >10 IU/mL
NIL: 0.04 IU/mL
QuantiFERON-TB Gold Plus: NEGATIVE
TB1-NIL: 0.01 IU/mL
TB2-NIL: 0 IU/mL

## 2021-07-17 NOTE — Progress Notes (Signed)
TB gold negative

## 2021-09-04 NOTE — Progress Notes (Signed)
? ?Office Visit Note ? ?[name]             ?Date of Birth: 1966/02/17           ?MRN: [id number]             ?PCP: Rita Ohara, MD ?Referring: Rita Ohara, MD ?Visit Date: 09/18/2021 ?Occupation: _0 @ ? ?Subjective:  ?Discuss medications  ? ?History of Present Illness: Jill Mathews is a 56 y.o. female with history of seropositive rheumatoid arthritis, osteoarthritis, and myofascial pain.  Patient has been off of Humira and methotrexate since October 2022 due to recurrent MRSA infections.  She has been apprehensive to restart on therapy due to the recurrence of the wound infections.  She does not want to restart on Humira and methotrexate at this time.  She denies any signs or symptoms of a rheumatoid arthritis flare since her last office visit. ?She has been able to increase her activity level since her last office visit.  She has been hiking 3 to 5 miles as well as fishing for enjoyment.  She is also filed for [employer] since her last office visit since her mother is currently in hospice.  She has been under less stress at work since being able to use FMLA.  She has been trying to focus on good restorative sleep.  She has occasional myalgias and muscle tenderness and takes methocarbamol 500 mg twice daily as needed for muscle spasms.  She experiences intermittent pain and stiffness in her left hip and left knee joint but denies any joint swelling at this time.  She is open to restarting on Plaquenil which she previously tolerated without any side effects.  ? ? ?Activities of Daily Living:  ?Patient reports morning stiffness for 0  none .   ?Patient Denies nocturnal pain.  ?Difficulty dressing/grooming: Denies ?Difficulty climbing stairs: Denies ?Difficulty getting out of chair: Denies ?Difficulty using hands for taps, buttons, cutlery, and/or writing: Denies ? ?Review of Systems  ?Constitutional:  Positive for fatigue.  ?HENT:  Negative for mouth dryness.   ?Eyes:  Negative for dryness.   ?Respiratory:  Negative for shortness of breath.   ?Cardiovascular:  Negative for swelling in legs/feet.  ?Gastrointestinal:  Negative for constipation.  ?Genitourinary:  Negative for difficulty urinating.  ?Musculoskeletal:  Positive for muscle tenderness.  ?Skin:  Negative for rash.  ?Allergic/Immunologic: Positive for susceptible to infections.  ?Neurological:  Negative for numbness.  ?Hematological:  Negative for bruising/bleeding tendency.  ?Psychiatric/Behavioral:  Negative for sleep disturbance.   ? ?PMFS History:  ?Patient Active Problem List  ? Diagnosis Date Noted  ? Blepharitis of left upper eyelid 03/13/2021  ? Skin infection 03/13/2021  ? High risk medication use 03/13/2021  ? Obstructive sleep apnea 01/07/2021  ? Chronic fatigue 11/24/2020  ? 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 Class 2 obesity due to excess calories without serious comorbidity with body mass index (BMI) of 36.0 to 36.9 in adult 11/24/2020  ? Excessive daytime sleepiness 11/24/2020  ? 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 Fibromyalgia 01/07/2020  ? HLA B27 (HLA B27 positive) 10/24/2016  ? Prediabetes 10/31/2014  ? Mixed hyperlipidemia 09/09/2012  ? Hypothyroidism 05/25/2012  ? Essential hypertension, benign 05/25/2012  ? Rheumatoid arthritis involving multiple sites with positive rheumatoid factor ([place]) 05/25/2012  ? Obesity (BMI 30-39.9) 05/25/2012  ?  ?Past Medical History:  ?Diagnosis Date  ? Fibromyalgia 03/2019  ? put on cymbalta, and pain resolved  ? Hyperlipidemia   ? borderline; dietary  management  ? Hypertension   ? Hypothyroidism 2001  ? Rheumatoid arthritis(714.0) 04/2006  ? Dr. Estanislado Pandy manages  ? Sleep apnea   ? per patient, dx by neuro  ?  ?Family History  ?Problem Relation Age of Onset  ? Heart disease Mother 26  ?     MI, stents x 2; CABG age 60  ? Stroke Mother 84  ? Hyperlipidemia Mother   ? Hypertension Mother   ? Depression Mother   ? Diabetes Mother   ? Thyroid disease Mother    ?     Graves disease, s/p RAI, now hypothyroid  ? Irritable bowel syndrome Mother   ? COPD Mother   ? Colon polyps Mother   ? Cancer Mother   ?     skin (not melanoma); lung cancer 2021  ? Kidney Stones Mother   ? Lung cancer Mother   ? Stroke Father 49  ? Hyperlipidemia Father   ? Hypertension Father   ? Depression Father   ? Diabetes Father   ? Thyroid disease Father   ?     hypothyroidism  ? COPD Father   ? Heart disease Father 45  ?     MI; s/p CABG; pacemaker  ? Cancer Sister 54  ?     pancreatic cancer  ? Bipolar disorder Sister   ? Heart disease Maternal Uncle   ?     s/p CABG, late 60's  ? Lupus Paternal Aunt   ? Cancer Maternal Grandfather   ?     laryngeal, smoker  ? Multiple sclerosis Cousin   ? ?Past Surgical History:  ?Procedure Laterality Date  ? EXCISION MORTON'S NEUROMA  05/2017  ? right foot   ? PILONIDAL CYST EXCISION  1983  ? TONSILLECTOMY  age 61  ? WISDOM TOOTH EXTRACTION    ? ?Social History  ? ?Social History Narrative  ? Lives with female partner Judson Roch) of 60 years--got married 06/11/13. 3 cats, 1 dog  ? 2 cats 1 dog.  ? 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  ? 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  ? ?Immunization History  ?Administered Date(s) Administered  ? Influenza Inj Mdck Quad Pf 04/11/2019  ? Influenza Split 03/10/2012  ? Influenza,inj,Quad PF,6+ Mos 04/11/2019, 03/01/2021  ? Influenza-Unspecified 03/10/2020  ? PFIZER Comirnaty(Gray Top)Covid-19 Tri-Sucrose Vaccine 12/27/2020  ? PFIZER(Purple Top)SARS-COV-2 Vaccination 08/27/2019, 09/10/2019, 01/09/2020  ? Pension scheme manager 52yr & up 03/01/2021  ? Pneumococcal Conjugate-13 11/01/2015  ? Pneumococcal Polysaccharide-23 07/02/2006, 12/30/2018  ? Tdap 09/09/2012  ?  ? ?Objective: ?Vital Signs: BP 116/79 (BP Location: Left Arm, Patient Position: Sitting, Cuff Size: Normal)   Pulse 73    Resp 16   Ht _0  (1.702 m)   Wt 247 lb (112 kg)   LMP 01/13/2013   BMI 38.69 kg/m?   ? ?Physical Exam ?Vitals and nursing note reviewed.  ?Constitutional:   ?   Appearance: She is well-developed.  ?HENT:  ?   Head: Normocephalic and atraumatic.  ?Eyes:  ?   Conjunctiva/sclera: Conjunctivae normal.  ?Cardiovascular:  ?   Rate and Rhythm: Normal rate and regular rhythm.  ?   Heart sounds: Normal heart sounds.  ?Pulmonary:  ?   Effort: Pulmonary effort is normal.  ?   Breath sounds: Normal breath sounds.  ?Abdominal:  ?   General: Bowel sounds are normal.  ?  Palpations: Abdomen is soft.  ?Musculoskeletal:  ?   Cervical back: Normal range of motion.  ?Skin: ?   General: Skin is warm and dry.  ?   Capillary Refill: Capillary refill takes less than 2 seconds.  ?Neurological:  ?   Mental Status: She is alert and oriented to person, place, and time.  ?Psychiatric:     ?   Behavior: Behavior normal.  ?  ? ?Musculoskeletal Exam: C-spine, thoracic spine, and lumbar spine good ROM.  Shoulder joints, elbow joints, wrist joints, MCPs, PIPs, and DIPs good ROM with no synovitis.  Complete fist formation bilaterally.  Hip joints, knee joints, and ankle joints have good ROM with no discomfort at this time.  No warmth or effusion of knee joints.  No tenderness or swelling of ankle joints.  ? ?CDAI Exam: ?CDAI Score: 0.4  ?Patient Global: 2 mm; Provider Global: 2 mm ?Swollen: 0 ; Tender: 0  ?Joint Exam 09/18/2021  ? ?No joint exam has been documented for this visit  ? ?There is currently no information documented on the homunculus. Go to the Rheumatology activity and complete the homunculus joint exam. ? ?Investigation: ?No additional findings. ? ?Imaging: ?No results found. ? ?Recent Labs: ?Lab Results  ?Component Value Date  ? WBC 5.7 07/12/2021  ? HGB 14.4 07/12/2021  ? PLT 261 07/12/2021  ? NA 140 07/12/2021  ? K 4.2 07/12/2021  ? CL 106 07/12/2021  ? CO2 27 07/12/2021  ? GLUCOSE 110 (H) 07/12/2021  ? BUN 19 07/12/2021  ?  CREATININE 0.79 07/12/2021  ? BILITOT 0.5 07/12/2021  ? ALKPHOS 70 12/29/2019  ? AST 14 07/12/2021  ? ALT 12 07/12/2021  ? PROT 6.5 07/12/2021  ? ALBUMIN 4.2 12/29/2019  ? CALCIUM 9.9 07/12/2021  ? GFRAA 85 07/05/202

## 2021-09-18 ENCOUNTER — Ambulatory Visit: Payer: BC Managed Care – PPO | Admitting: Physician Assistant

## 2021-09-18 ENCOUNTER — Encounter: Payer: Self-pay | Admitting: Physician Assistant

## 2021-09-18 VITALS — BP 116/79 | HR 73 | Resp 16 | Ht 67.0 in | Wt 247.0 lb

## 2021-09-18 DIAGNOSIS — M7522 Bicipital tendinitis, left shoulder: Secondary | ICD-10-CM

## 2021-09-18 DIAGNOSIS — Z1589 Genetic susceptibility to other disease: Secondary | ICD-10-CM

## 2021-09-18 DIAGNOSIS — Z79899 Other long term (current) drug therapy: Secondary | ICD-10-CM | POA: Diagnosis not present

## 2021-09-18 DIAGNOSIS — R413 Other amnesia: Secondary | ICD-10-CM

## 2021-09-18 DIAGNOSIS — Z8679 Personal history of other diseases of the circulatory system: Secondary | ICD-10-CM

## 2021-09-18 DIAGNOSIS — G4710 Hypersomnia, unspecified: Secondary | ICD-10-CM

## 2021-09-18 DIAGNOSIS — M0579 Rheumatoid arthritis with rheumatoid factor of multiple sites without organ or systems involvement: Secondary | ICD-10-CM | POA: Diagnosis not present

## 2021-09-18 DIAGNOSIS — G5761 Lesion of plantar nerve, right lower limb: Secondary | ICD-10-CM

## 2021-09-18 DIAGNOSIS — Z8639 Personal history of other endocrine, nutritional and metabolic disease: Secondary | ICD-10-CM

## 2021-09-18 DIAGNOSIS — M62838 Other muscle spasm: Secondary | ICD-10-CM

## 2021-09-18 DIAGNOSIS — M1711 Unilateral primary osteoarthritis, right knee: Secondary | ICD-10-CM

## 2021-09-18 DIAGNOSIS — R5383 Other fatigue: Secondary | ICD-10-CM

## 2021-09-18 DIAGNOSIS — M7918 Myalgia, other site: Secondary | ICD-10-CM

## 2021-09-18 DIAGNOSIS — R7303 Prediabetes: Secondary | ICD-10-CM

## 2021-09-18 MED ORDER — METHOCARBAMOL 500 MG PO TABS: 500.0000 mg | ORAL_TABLET | Freq: Two times a day (BID) | ORAL | 0 refills | Status: DC

## 2021-09-18 MED ORDER — HYDROXYCHLOROQUINE SULFATE 200 MG PO TABS: 200.0000 mg | ORAL_TABLET | Freq: Two times a day (BID) | ORAL | 0 refills | Status: DC

## 2021-09-18 NOTE — Patient Instructions (Addendum)
Standing Labs ?We placed an order today for your standing lab work.  ? ?Please have your standing labs drawn in 1 month  ? ?If possible, please have your labs drawn 2 weeks prior to your appointment so that the provider can discuss your results at your appointment. ? ?Please note that you may see your imaging and lab results in MyChart before we have reviewed them. ?We may be awaiting multiple results to interpret others before contacting you. ?Please allow our office up to 72 hours to thoroughly review all of the results before contacting the office for clarification of your results. ? ?We have open lab daily: ?Monday through Thursday from 1:30-4:30 PM and Friday from 1:30-4:00 PM ?at the office of Dr. Pollyann Savoy, [facility].   ?Please be advised, all patients with office appointments requiring lab work will take precedent over walk-in lab work.  ?If possible, please come for your lab work on Monday and Friday afternoons, as you may experience shorter wait times. ?The office is located at [address], [address], [place], [place] [address] ?No appointment is necessary.   ?Labs are drawn by [facility]. Please bring your co-pay at the time of your lab draw.  You may receive a bill from [facility] for your lab work. ? ?Please note if you are on Hydroxychloroquine and and an order has been placed for a Hydroxychloroquine level, you will need to have it drawn 4 hours or more after your last dose. ? ?If you wish to have your labs drawn at another location, please call the office 24 hours in advance to send orders. ? ?If you have any questions regarding directions or hours of operation,  ?please call [phone].   ?As a reminder, please drink plenty of water prior to coming for your lab work. Thanks! ? ? ?Hydroxychloroquine Tablets ?What is this medication? ?HYDROXYCHLOROQUINE (hye drox ee KLOR oh kwin) treats autoimmune conditions, such as rheumatoid arthritis and lupus. It works by slowing down an  overactive immune system. It may also be used to prevent and treat malaria. It works by killing the parasite that causes malaria. It belongs to a group of medications called DMARDs. ?This medicine may be used for other purposes; ask your health care provider or pharmacist if you have questions. ?COMMON BRAND NAME(S): Plaquenil, Quineprox ?What should I tell my care team before I take this medication? ?They need to know if you have any of these conditions: ?Diabetes ?Eye disease, vision problems ?G6PD deficiency ?Heart disease ?History of irregular heartbeat ?If you often drink alcohol ?Kidney disease ?Liver disease ?Porphyria ?Psoriasis ?An unusual or allergic reaction to chloroquine, hydroxychloroquine, other medications, foods, dyes, or preservatives ?Pregnant or trying to get pregnant ?Breast-feeding ?How should I use this medication? ?Take this medication by mouth with a glass of water. Take it as directed on the prescription label. Do not cut, crush or chew this medication. Swallow the tablets whole. Take it with food. Do not take it more than directed. Take all of this medication unless your care team tells you to stop it early. Keep taking it even if you think you are better. ?Take products with antacids in them at a different time of day than this medication. Take this medication 4 hours before or 4 hours after antacids. Talk to your care team if you have questions. ?Talk to your care team about the use of this medication in children. While this medication may be prescribed for selected conditions, precautions do apply. ?Overdosage: If you think  you have taken too much of this medicine contact a poison control center or emergency room at once. ?NOTE: This medicine is only for you. Do not share this medicine with others. ?What if I miss a dose? ?If you miss a dose, take it as soon as you can. If it is almost time for your next dose, take only that dose. Do not take double or extra doses. ?What may interact  with this medication? ?Do not take this medication with any of the following: ?Cisapride ?Dronedarone ?Pimozide ?Thioridazine ?This medication may also interact with the following: ?Ampicillin ?Antacids ?Cimetidine ?Cyclosporine ?Digoxin ?Kaolin ?Medications for diabetes, like insulin, glipizide, glyburide ?Medications for seizures like carbamazepine, phenobarbital, phenytoin ?Mefloquine ?Methotrexate ?Other medications that prolong the QT interval (cause an abnormal heart rhythm) ?Praziquantel ?This list may not describe all possible interactions. Give your health care provider a list of all the medicines, herbs, non-prescription drugs, or dietary supplements you use. Also tell them if you smoke, drink alcohol, or use illegal drugs. Some items may interact with your medicine. ?What should I watch for while using this medication? ?Visit your care team for regular checks on your progress. Tell your care team if your symptoms do not start to get better or if they get worse. ?You may need blood work done while you are taking this medication. If you take other medications that can affect heart rhythm, you may need more testing. Talk to your care team if you have questions. ?Your vision may be tested before and during use of this medication. Tell your care team right away if you have any change in your eyesight. ?This medication may cause serious skin reactions. They can happen weeks to months after starting the medication. Contact your care team right away if you notice fevers or flu-like symptoms with a rash. The rash may be red or purple and then turn into blisters or peeling of the skin. Or, you might notice a red rash with swelling of the face, lips or lymph nodes in your neck or under your arms. ?If you or your family notice any changes in your behavior, such as new or worsening depression, thoughts of harming yourself, anxiety, or other unusual or disturbing thoughts, or memory loss, call your care team right  away. ?What side effects may I notice from receiving this medication? ?Side effects that you should report to your care team as soon as possible: ?Allergic reactions--skin rash, itching, hives, swelling of the face, lips, tongue, or throat ?Aplastic anemia--unusual weakness or fatigue, dizziness, headache, trouble breathing, increased bleeding or bruising ?Change in vision ?Heart rhythm changes--fast or irregular heartbeat, dizziness, feeling faint or lightheaded, chest pain, trouble breathing ?Infection--fever, chills, cough, or sore throat ?Low blood sugar (hypoglycemia)--tremors or shaking, anxiety, sweating, cold or clammy skin, confusion, dizziness, rapid heartbeat ?Muscle injury--unusual weakness or fatigue, muscle pain, dark yellow or brown urine, decrease in amount of urine ?Pain, tingling, or numbness in the hands or feet ?Rash, fever, and swollen lymph nodes ?Redness, blistering, peeling, or loosening of the skin, including inside the mouth ?Thoughts of suicide or self-harm, worsening mood, or feelings of depression ?Unusual bruising or bleeding ?Side effects that usually do not require medical attention (report to your care team if they continue or are bothersome): ?Diarrhea ?Headache ?Nausea ?Stomach pain ?Vomiting ?This list may not describe all possible side effects. Call your doctor for medical advice about side effects. You may report side effects to FDA at 1-800-FDA-1088. ?Where should I keep my medication? ?Keep out  of the reach of children and pets. ?Store at room temperature up to 30 degrees C (86 degrees F). Protect from light. Get rid of any unused medication after the expiration date. ?To get rid of medications that are no longer needed or have expired: ?Take the medication to a medication take-back program. Check with your pharmacy or law enforcement to find a location. ?If you cannot return the medication, check the label or package insert to see if the medication should be thrown out in the  garbage or flushed down the toilet. If you are not sure, ask your care team. If it is safe to put it in the trash, empty the medication out of the container. Mix the medication with cat litter, dirt, coffee ground

## 2021-09-25 ENCOUNTER — Other Ambulatory Visit: Payer: Self-pay | Admitting: Physician Assistant

## 2021-09-25 NOTE — Telephone Encounter (Signed)
Next Visit: 12/26/2021 ? ?Last Visit: 09/18/2021 ? ?Last Fill: 03/13/2021 ? ?Dx: Myofascial pain ? ?Current Dose per office note on 09/18/2021: not discussed ? ?Okay to refill Cymbalta?   ?

## 2021-12-04 ENCOUNTER — Other Ambulatory Visit: Payer: Self-pay | Admitting: Rheumatology

## 2021-12-04 NOTE — Telephone Encounter (Signed)
Patient states she would like to continue prescription as it is. Patient states she has heat sensitivity if she take 60 mg at one time.

## 2021-12-14 NOTE — Progress Notes (Signed)
Office Visit Note  [name]             Date of Birth: [birth year]           MRN: [id number]             PCP: Rita Ohara, MD Referring: Rita Ohara, MD Visit Date: 12/26/2021 Occupation: '@GUAROCC' @  Subjective:  Medication management  History of Present Illness: Pearlena Ow is a 56 y.o. female seropositive rheumatoid arthritis, osteoarthritis and myofascial pain syndrome.  She has been off methotrexate and Humira since October 2022 after she acquired MRSA infection.  She restarted Humira in April 2023 but had recurrence of MRSA and then she stopped Humira.  She has been on hydroxychloroquine monotherapy since then.  She has been taking hydroxychloroquine 200 mg 1 tablet p.o. twice daily.  She denies any increased joint pain or joint swelling.  She had surgery for Morton's neuroma which resolved.  Her left bicipital tendinitis resolved.  She continues to have flares of fibromyalgia with increased pain and discomfort.  She has been taking Cymbalta 30 mg p.o. twice daily which has been helpful.  She denies any trapezius spasm today.  She has resumed her activities including hiking and canoeing.    Activities of Daily Living:  Patient reports morning stiffness for 0  none .   Patient Reports nocturnal pain.  Difficulty dressing/grooming: Denies Difficulty climbing stairs: Denies Difficulty getting out of chair: Denies Difficulty using hands for taps, buttons, cutlery, and/or writing: Denies  Review of Systems  Constitutional:  Negative for fatigue.  HENT:  Negative for mouth dryness.   Eyes:  Positive for dryness.  Respiratory:  Positive for shortness of breath.   Cardiovascular:  Negative for swelling in legs/feet.  Gastrointestinal:  Negative for constipation.  Endocrine: Positive for heat intolerance.  Genitourinary:  Negative for difficulty urinating.  Musculoskeletal:  Positive for gait problem and muscle tenderness.  Skin:  Negative for rash.   Allergic/Immunologic: Positive for susceptible to infections.  Neurological:  Negative for numbness.  Hematological:  Negative for bruising/bleeding tendency.  Psychiatric/Behavioral:  Negative for sleep disturbance.     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 Estanislado Pandy manages   Sleep apnea    per patient, dx by neuro    Family History  Problem Relation Age of Onset   Heart disease Mother 87       MI, stents x 2; CABG age 72   Stroke Mother 43  Hyperlipidemia Mother    Hypertension Mother    Depression Mother    Diabetes Mother    Thyroid disease Mother        Graves disease, s/p RAI, now hypothyroid   Irritable bowel syndrome Mother    COPD Mother    Colon polyps Mother    Cancer Mother        skin (not melanoma); lung cancer 2021   Kidney Stones Mother    Lung cancer Mother    Stroke Father 24   Hyperlipidemia Father     Hypertension Father    Depression Father    Diabetes Father    Thyroid disease Father        hypothyroidism   COPD Father    Heart disease Father 109       MI; s/p CABG; pacemaker   Cancer Sister 31       pancreatic cancer   Bipolar disorder Sister    Heart disease Maternal Uncle        s/p CABG, late 60's   Lupus Paternal Aunt    Cancer Maternal Grandfather        laryngeal, smoker   Multiple sclerosis Cousin    Past Surgical History:  Procedure Laterality Date   EXCISION MORTON'S NEUROMA  05/2017   right foot    PILONIDAL CYST EXCISION  4   TONSILLECTOMY  age 58   WISDOM TOOTH EXTRACTION     Social History   Social History Narrative   Lives with female partner Judson Roch) of 87 years--got married 06/11/13. 3 cats, 1 dog   2 cats 1 dog.   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   Immunization History  Administered Date(s) Administered   Influenza Inj Mdck Quad Pf 04/11/2019   Influenza Split 03/10/2012   Influenza,inj,Quad PF,6+ Mos 04/11/2019, 03/01/2021   Influenza-Unspecified 03/10/2020   PFIZER Comirnaty(Gray Top)Covid-19 Tri-[place] Vaccine 12/27/2020   PFIZER(Purple Top)SARS-COV-2 Vaccination 08/27/2019, 09/10/2019, 01/09/2020   Pfizer Covid-19 Vaccine Bivalent Booster 76yr & up 03/01/2021   Pneumococcal Conjugate-13 11/01/2015   Pneumococcal Polysaccharide-23 07/02/2006, 12/30/2018   Tdap 09/09/2012     Objective: Vital Signs: BP 125/82 (BP Location: Left Arm, Patient Position: Sitting, Cuff Size: Normal)   Pulse 75   Resp 16   Ht '5\' 7"'  (1.702 m)   Wt 248 lb (112.5 kg)   LMP 01/13/2013   BMI 38.84 kg/m    Physical Exam Vitals and nursing note reviewed.  Constitutional:      Appearance: She is well-developed.  HENT:     Head: Normocephalic and atraumatic.  Eyes:     Conjunctiva/sclera:  Conjunctivae normal.  Cardiovascular:     Rate and Rhythm: Normal rate and regular rhythm.     Heart sounds: Normal heart sounds.  Pulmonary:     Effort: Pulmonary effort is normal.     Breath sounds: Normal breath sounds.  Abdominal:     General: Bowel sounds are normal.     Palpations: Abdomen is soft.  Musculoskeletal:     Cervical back: Normal range of motion.  Lymphadenopathy:     Cervical: No cervical adenopathy.  Skin:    General: Skin is warm and dry.     Capillary Refill: Capillary refill takes less than 2 seconds.  Neurological:     Mental Status: She is alert and oriented to  person, place, and time.  Psychiatric:        Behavior: Behavior normal.      Musculoskeletal Exam: C-spine was in good range of motion.  Shoulder joints, elbow joints, wrist joints, MCPs PIPs and DIPs with good range of motion with no synovitis.  Hip joints, knee joints, ankles, MTPs and PIPs with good range of motion with no synovitis.  CDAI Exam: CDAI Score: 0  Patient Global: 0 mm; Provider Global: 0 mm Swollen: 0 ; Tender: 0  Joint Exam 12/26/2021   No joint exam has been documented for this visit   There is currently no information documented on the homunculus. Go to the Rheumatology activity and complete the homunculus joint exam.  Investigation: No additional findings.  Imaging: No results found.  Recent Labs: Lab Results  Component Value Date   WBC 5.7 07/12/2021   HGB 14.4 07/12/2021   PLT 261 07/12/2021   NA 140 07/12/2021   K 4.2 07/12/2021   CL 106 07/12/2021   CO2 27 07/12/2021   GLUCOSE 110 (H) 07/12/2021   BUN 19 07/12/2021   CREATININE 0.79 07/12/2021   BILITOT 0.5 07/12/2021   ALKPHOS 70 12/29/2019   AST 14 07/12/2021   ALT 12 07/12/2021   PROT 6.5 07/12/2021   ALBUMIN 4.2 12/29/2019   CALCIUM 9.9 07/12/2021   GFRAA 85 12/12/2020   QFTBGOLD NEGATIVE 05/05/2017   QFTBGOLDPLUS NEGATIVE 07/12/2021    Speciality Comments: Prior therapy includes: Enbrel  (inadequate response), Plaquenil (inadequate response)   Procedures:  No procedures performed Allergies: Penicillins   Assessment / Plan:     Visit Diagnoses: Rheumatoid arthritis involving multiple sites with positive rheumatoid factor (HCC) - +RF,+ anti-CCP: She discontinued methotrexate and Humira in October 2022 after she developed MRSA infection.  It was treated with antibiotics.  She resumed Humira in April but discontinued again due to recurrence of MRSA infection.  She decided to stay off Humira.  She has been on hydroxychloroquine monotherapy which she is tolerating well.  She denies any joint pain or joint swelling.  She has been very active.  She has been hiking, canoeing and gardening.  High risk medication use - plaquenil 200 mg 1 tablet by mouth twice daily started April 2023.  She has not had an eye examination yet.  She was advised to get eye examination as soon as possible.  Risk of ocular toxicity was discussed.  We will check labs today.   - Plan: CBC with Differential/Platelet, COMPLETE METABOLIC PANEL WITH GFR  HLA B27 (HLA B27 positive)  Bicipital tendinitis, left shoulder-resolved.  Primary osteoarthritis of right knee-she has intermittent discomfort.  Morton's neuroma of right foot-resolved after surgery.  Myofascial pain-she continues to have generalized pain and discomfort.  She gives history of frequent flares.  Trapezius muscle spasm-she had intermittent discomfort in the trapezius region.  She did not have any tenderness today.  Hypersomnolence-improved since she has been on CPAP.  Other fatigue-she has noted improvement on CPAP.  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  History of hypertension-blood pressure was normal today.  History of hyperlipidemia-increased risk of heart disease with rheumatoid arthritis was discussed.  Need for regular exercise was emphasized.  Prediabetes  History of  hypothyroidism  Orders: Orders Placed This Encounter  Procedures   CBC with Differential/Platelet   COMPLETE METABOLIC PANEL WITH GFR   No orders of the defined types were placed in this encounter.  .  Follow-Up Instructions: Return  in about 5 months (around 05/28/2022) for Rheumatoid arthritis.   Bo Merino, MD  Note - This record has been created using Editor, commissioning.  Chart creation errors have been sought, but may not always  have been located. Such creation errors do not reflect on  the standard of medical care.

## 2021-12-19 ENCOUNTER — Other Ambulatory Visit: Payer: Self-pay | Admitting: Physician Assistant

## 2021-12-19 NOTE — Telephone Encounter (Signed)
Next Visit: 12/26/2021   Last Visit: 09/18/2021   Last Fill: 09/18/2021  Dx: Rheumatoid arthritis involving multiple sites with positive rheumatoid factor   Current Dose per office note on 09/18/2021: Plaquenil 200 mg 1 tablet by mouth twice daily  Labs: 07/12/2021 Glucose is 110. Rest of CMP WNL. CBC WNL.   PLQ Eye Exam: not on file.  Spoke with patient and advised she is due for a PLQ eye exam. Patient states she will call to get that scheduled and will call to let us know when it is scheduled for.   Okay to refill PLQ?

## 2021-12-26 ENCOUNTER — Encounter: Payer: Self-pay | Admitting: Rheumatology

## 2021-12-26 ENCOUNTER — Ambulatory Visit: Payer: BC Managed Care – PPO | Admitting: Rheumatology

## 2021-12-26 VITALS — BP 125/82 | HR 75 | Resp 16 | Ht 67.0 in | Wt 248.0 lb

## 2021-12-26 DIAGNOSIS — M7918 Myalgia, other site: Secondary | ICD-10-CM

## 2021-12-26 DIAGNOSIS — M1711 Unilateral primary osteoarthritis, right knee: Secondary | ICD-10-CM

## 2021-12-26 DIAGNOSIS — Z8639 Personal history of other endocrine, nutritional and metabolic disease: Secondary | ICD-10-CM

## 2021-12-26 DIAGNOSIS — Z1589 Genetic susceptibility to other disease: Secondary | ICD-10-CM

## 2021-12-26 DIAGNOSIS — G4733 Obstructive sleep apnea (adult) (pediatric): Secondary | ICD-10-CM

## 2021-12-26 DIAGNOSIS — M62838 Other muscle spasm: Secondary | ICD-10-CM

## 2021-12-26 DIAGNOSIS — M0579 Rheumatoid arthritis with rheumatoid factor of multiple sites without organ or systems involvement: Secondary | ICD-10-CM

## 2021-12-26 DIAGNOSIS — Z8679 Personal history of other diseases of the circulatory system: Secondary | ICD-10-CM

## 2021-12-26 DIAGNOSIS — G5761 Lesion of plantar nerve, right lower limb: Secondary | ICD-10-CM

## 2021-12-26 DIAGNOSIS — Z79899 Other long term (current) drug therapy: Secondary | ICD-10-CM | POA: Diagnosis not present

## 2021-12-26 DIAGNOSIS — R7303 Prediabetes: Secondary | ICD-10-CM

## 2021-12-26 DIAGNOSIS — M7522 Bicipital tendinitis, left shoulder: Secondary | ICD-10-CM

## 2021-12-26 DIAGNOSIS — R413 Other amnesia: Secondary | ICD-10-CM

## 2021-12-26 DIAGNOSIS — G4710 Hypersomnia, unspecified: Secondary | ICD-10-CM

## 2021-12-26 DIAGNOSIS — R5383 Other fatigue: Secondary | ICD-10-CM

## 2021-12-26 NOTE — Patient Instructions (Signed)
Please schedule appointment with the ophthalmologist as soon as possible.   Standing Labs We placed an order today for your standing lab work.   Please have your standing labs drawn in December  If possible, please have your labs drawn 2 weeks prior to your appointment so that the provider can discuss your results at your appointment.  Please note that you may see your imaging and lab results in MyChart before we have reviewed them. We may be awaiting multiple results to interpret others before contacting you. Please allow our office up to 72 hours to thoroughly review all of the results before contacting the office for clarification of your results.  We have open lab daily: Monday through Thursday from 1:30-4:30 PM and Friday from 1:30-4:00 PM at the office of Dr. Pollyann Savoy, [facility].   Please be advised, all patients with office appointments requiring lab work will take precedent over walk-in lab work.  If possible, please come for your lab work on Monday and Friday afternoons, as you may experience shorter wait times. The office is located at [address], [place], [place] [address] No appointment is necessary.   Labs are drawn by [facility]. Please bring your co-pay at the time of your lab draw.  You may receive a bill from [facility] for your lab work.  Please note if you are on Hydroxychloroquine and and an order has been placed for a Hydroxychloroquine level, you will need to have it drawn 4 hours or more after your last dose.  If you wish to have your labs drawn at another location, please call the office 24 hours in advance to send orders.  If you have any questions regarding directions or hours of operation,  please call [phone].   As a reminder, please drink plenty of water prior to coming for your lab work. Thanks!   Vaccines You are taking a medication(s) that can suppress your immune system.  The following immunizations are  recommended: Flu annually Covid-19  Td/Tdap (tetanus, diphtheria, pertussis) every 10 years Pneumonia (Prevnar 15 then Pneumovax 23 at least 1 year apart.  Alternatively, can take Prevnar 20 without needing additional dose) Shingrix: 2 doses from 4 weeks to 6 months apart  Please check with your PCP to make sure you are up to date.

## 2021-12-27 LAB — CBC WITH DIFFERENTIAL/PLATELET
Absolute Monocytes: 395 cells/uL (ref 200–950)
Basophils Absolute: 23 cells/uL (ref 0–200)
Basophils Relative: 0.3 %
Eosinophils Absolute: 144 cells/uL (ref 15–500)
Eosinophils Relative: 1.9 %
HCT: 42.5 % (ref 35.0–45.0)
Hemoglobin: 14.1 g/dL (ref 11.7–15.5)
Lymphs Abs: 2265 cells/uL (ref 850–3900)
MCH: 31.1 pg (ref 27.0–33.0)
MCHC: 33.2 g/dL (ref 32.0–36.0)
MCV: 93.6 fL (ref 80.0–100.0)
MPV: 10.6 fL (ref 7.5–12.5)
Monocytes Relative: 5.2 %
Neutro Abs: 4773 cells/uL (ref 1500–7800)
Neutrophils Relative %: 62.8 %
Platelets: 271 10*3/uL (ref 140–400)
RBC: 4.54 10*6/uL (ref 3.80–5.10)
RDW: 12.2 % (ref 11.0–15.0)
Total Lymphocyte: 29.8 %
WBC: 7.6 10*3/uL (ref 3.8–10.8)

## 2021-12-27 LAB — COMPLETE METABOLIC PANEL WITH GFR
AG Ratio: 1.8 (calc) (ref 1.0–2.5)
ALT: 16 U/L (ref 6–29)
AST: 17 U/L (ref 10–35)
Albumin: 4 g/dL (ref 3.6–5.1)
Alkaline phosphatase (APISO): 57 U/L (ref 37–153)
BUN: 12 mg/dL (ref 7–25)
CO2: 29 mmol/L (ref 20–32)
Calcium: 9.3 mg/dL (ref 8.6–10.4)
Chloride: 101 mmol/L (ref 98–110)
Creat: 0.86 mg/dL (ref 0.50–1.03)
Globulin: 2.2 g/dL (calc) (ref 1.9–3.7)
Glucose, Bld: 141 mg/dL — ABNORMAL HIGH (ref 65–99)
Potassium: 4 mmol/L (ref 3.5–5.3)
Sodium: 139 mmol/L (ref 135–146)
Total Bilirubin: 0.4 mg/dL (ref 0.2–1.2)
Total Protein: 6.2 g/dL (ref 6.1–8.1)
eGFR: 79 mL/min/{1.73_m2} (ref >=60)

## 2021-12-27 NOTE — Progress Notes (Signed)
CBC and CMP are normal except glucose is elevated.  Please forward results to her PCP.

## 2022-01-08 ENCOUNTER — Other Ambulatory Visit: Payer: Self-pay | Admitting: Family Medicine

## 2022-01-08 DIAGNOSIS — I10 Essential (primary) hypertension: Secondary | ICD-10-CM

## 2022-01-08 DIAGNOSIS — E039 Hypothyroidism, unspecified: Secondary | ICD-10-CM

## 2022-01-14 ENCOUNTER — Other Ambulatory Visit: Payer: BC Managed Care – PPO

## 2022-01-14 DIAGNOSIS — E782 Mixed hyperlipidemia: Secondary | ICD-10-CM

## 2022-01-15 LAB — LIPID PANEL
Chol/HDL Ratio: 3.9 ratio (ref 0.0–4.4)
Cholesterol, Total: 181 mg/dL (ref 100–199)
HDL: 47 mg/dL (ref >39)
LDL Chol Calc (NIH): 113 mg/dL — ABNORMAL HIGH (ref 0–99)
Triglycerides: 116 mg/dL (ref 0–149)
VLDL Cholesterol Cal: 21 mg/dL (ref 5–40)

## 2022-01-15 NOTE — Patient Instructions (Incomplete)
  HEALTH MAINTENANCE RECOMMENDATIONS:  It is recommended that you get at least 30 minutes of aerobic exercise at least 5 days/week (for weight loss, you may need as much as 60-90 minutes). This can be any activity that gets your heart rate up. This can be divided in 10-15 minute intervals if needed, but try and build up your endurance at least once a week.  Weight bearing exercise is also recommended twice weekly.  Eat a healthy diet with lots of vegetables, fruits and fiber.  "Colorful" foods have a lot of vitamins (ie green vegetables, tomatoes, red peppers, etc).  Limit sweet tea, regular sodas and alcoholic beverages, all of which has a lot of calories and sugar.  Up to 1 alcoholic drink daily may be beneficial for women (unless trying to lose weight, watch sugars).  Drink a lot of water.  Calcium recommendations are 1200-1500 mg daily (1500 mg for postmenopausal women or women without ovaries), and vitamin D 1000 IU daily.  This should be obtained from diet and/or supplements (vitamins), and calcium should not be taken all at once, but in divided doses.  Monthly self breast exams and yearly mammograms for women over the age of 43 is recommended.  Sunscreen of at least SPF 30 should be used on all sun-exposed parts of the skin when outside between the hours of 10 am and 4 pm (not just when at beach or pool, but even with exercise, golf, tennis, and yard work!)  Use a sunscreen that says "broad spectrum" so it covers both UVA and UVB rays, and make sure to reapply every 1-2 hours.  Remember to change the batteries in your smoke detectors when changing your clock times in the spring and fall. Carbon monoxide detectors are recommended for your home.  Use your seat belt every time you are in a car, and please drive safely and not be distracted with cell phones and texting while driving.    You are past due for colon cancer screening. We discussed options including colonoscopy and Cologuard (every 3  years, if normal), and FIT testing (yearly test).     I recommend getting the new shingles vaccine (Shingrix). Check with your insurance to verify what your out of pocket cost may be (usually covered as preventative, but better to verify to avoid any surprises, as this vaccine is expensive), and then schedule a nurse visit at our office when convenient (based on the possible side effects as discussed).   This is a series of 2 injections, spaced 2 months apart.  It doesn't have to be exactly 2 months apart (but can't be under 2 months), if that isn't feasible for your schedule, but try and get them close to 2 months (and definitely within 6 months of each other, or else the efficacy of the vaccine drops off).   [facility] to schedule bone density test.  If it is normal, you won't need another for probably 10 years. If it shows thinning of the bones, we might monitor more often. Please consider scheduling a mammogram for the same day, for early detection of breast cancer.

## 2022-01-15 NOTE — Progress Notes (Unsigned)
No chief complaint on file.   Jill Mathews is a 56 y.o. female who presents for a complete physical.     Mild sleep apnea noted on sleep study 11/2020. She was started on CPAP last year, and noted improvement in her sleep--felt more rested, no longer napping. She continues to be compliant with use of CPAP UPDATE  Hypertension follow-up: She reports compliance with benazepril and HCTZ.   Blood pressures at home are running  mid-120's/70's (higher when she is around her mother.  BP Readings from Last 3 Encounters:  12/26/21 125/82  09/18/21 116/79  07/12/21 128/85    Denies dizziness, chest pain, edema.  Denies side effects of medications, muscle cramps.   Last year she noted "jumpy legs" at night--using diet tonic water which helps.  Migraines--infrequent/unchanged. Previously were just ophthalmic, occasionally gets headache (with aura).  Hasn't had one in a long time.  She never needed medication for headaches. Triggers seem to be dehydration and heat.   Hyperlipidemia: h/o borderline cholesterol, which improved with dietary changes. LDL had been up again in 2019. In 12/2019 LDL was 156, when eating red meat 4-5x/week, half and half in her 5 cups of coffee/d.  Last year she was eating more fish, red meat just 1-2x/week, switched to vinaigrette dressings. LDL was up to 165.  Current diet includes: UPDATE  Not much cheese or eggs.   Hypothyroidism:  Compliant with taking Synthroid 137 mcg daily, on an empty stomach, separate from her vitamins.  No hair/skin/bowel changes.  Energy is improved since starting CPAP. No weight changes. Lab Results  Component Value Date   TSH 1.820 01/08/2021   H/o pre-diabetes. She had elevated A1c in 2016 of 6.1.  Last check was 5.7% at her physical last year, fasting glucose 102. Nonfasting glucose was 141 with July's labs through rheum.  Obesity: Previously lost over 33# with [employer].  She was down to 222# 3.2 oz at her CPE in  12/2019. She had gained about 10# back at her physical last year, but maintaining that weight.  RA and fibromyalgia:  Under the care of Dr. Corliss Skains and PA Amada Jupiter.  Last year she reported doing well on Humira and methotrexate, but this was stopped after getting MRSA infection (in 03/2021, and recurred again after resuming this regimen).  Her medications were switched to hydroxychloroquine monotherapy (200mg  BID), and is doing well.  Fibromyalgia--doing well on cymbalta 30mg  BID. Last year was going to [facility].    Immunization History  Administered Date(s) Administered   Influenza Inj Mdck Quad Pf 04/11/2019   Influenza Split 03/10/2012   Influenza,inj,Quad PF,6+ Mos 04/11/2019, 03/01/2021   Influenza-Unspecified 03/10/2020   PFIZER Comirnaty(Gray Top)Covid-19 Tri-Sucrose Vaccine 12/27/2020   PFIZER(Purple Top)SARS-COV-2 Vaccination 08/27/2019, 09/10/2019, 01/09/2020   Pfizer Covid-19 Vaccine Bivalent Booster 44yrs & up 03/01/2021   Pneumococcal Conjugate-13 11/01/2015   Pneumococcal Polysaccharide-23 07/02/2006, 12/30/2018   Tdap 09/09/2012   Last Pap smear:  Age 28--had a very bad experience and refuses paps and pelvic exams Last mammogram: never; last year was "considering, but too much going on" and didn't get one. Last colonoscopy: never Last DEXA: never (discussed and ordered twice in the past, never scheduled) Dentist: every 6 months Ophtho: yearly (due to meds) Exercise:   (Prior routine had been walking 2 miles daily with the dog. 3 mile walk with 21# pack every other day.  Once a week rides an e-bike 14 miles, trying to keep the assist mainly for the hills.  3-5 mile hike (  10-11# hike) on Sundays)   Normal vitamin D level (36) in 05/2012   PMH, PSH, SH and FH reviewed and updated   ROS:  The patient denies anorexia, fever, headaches,  vision changes, decreased hearing, ear pain, sore throat, breast concerns, chest pain, palpitations, dizziness, syncope, dyspnea  on exertion, cough, swelling, nausea, vomiting, diarrhea, constipation, abdominal pain, melena, hematochezia, indigestion/heartburn, hematuria, incontinence, dysuria,  vaginal bleeding, discharge, odor or itch, genital lesions, numbness, tingling, weakness, tremor, suspicious skin lesions, depression, anxiety, abnormal bleeding/bruising, or enlarged lymph nodes.  Postmenopausal. No longer has hot flashes. Getting sweats, she related this to cymbalta. UPDATE No major muscle pains currently Joint pains?? Fatigue improved with starting CPAP.   Some insomnia, hard to quiet the mind. No longer having migraines. Dry eye   PHYSICAL EXAM:  LMP 01/13/2013   Wt Readings from Last 3 Encounters:  12/26/21 248 lb (112.5 kg)  09/18/21 247 lb (112 kg)  07/12/21 239 lb 12.8 oz (108.8 kg)   235# at CPE 01/2021 222# 3.2 oz at her CPE in 12/2019. 247# in 10/2016  General Appearance:      Alert, cooperative, no distress, appears stated age. She declined to get into a gown, declines breast and pelvic exam  Head:      Normocephalic, without obvious abnormality, atraumatic    Eyes:      PERRL, conjunctiva/corneas clear, EOM's intact, fundi benign    Ears:      Normal TM's and external ear canals    Nose:     Normal, no drainage or sinus tenderness  Throat:     Normal, no lesions or erythema   Neck:     Supple, no lymphadenopathy;  thyroid: no enlargement/ tenderness/nodules; no carotid bruit or JVD    Back:      Spine nontender, no curvature, ROM normal, no CVA tenderness. No trigger points  Lungs:       Clear to auscultation bilaterally without wheezes, rales or ronchi; respirations unlabored    Chest Wall:      No tenderness or deformity     Heart:      Regular rate and rhythm, S1 and S2 normal, no murmur, rub or gallop    Breast Exam:      exam refused by patient  Abdomen:       Soft, non-tender, nondistended, normoactive bowel sounds, no masses, no hepatosplenomegaly    Genitalia:      Refused by  patient   Rectal:      Refused by patient   Extremities:     No clubbing, cyanosis or edema    Pulses:     2+ and symmetric all extremities    Skin:     Skin color, texture, turgor normal, no rashes. Nevus on nose, uniform color, unchanged.   Lymph nodes:     Cervical, supraclavicular nodes normal    Neurologic:     Normal strength, sensation and gait; reflexes 2+ and symmetric throughout          Psych:   Normal mood, affect, hygiene and grooming.   ***UPDATE if trigger point. UPDATE skin--mole on nose   Recent labs:  Lab Results  Component Value Date   CHOL 181 01/14/2022   CHOL 256 (H) 01/08/2021   CHOL 237 (H) 12/29/2019   Lab Results  Component Value Date   HDL 47 01/14/2022   HDL 66 01/08/2021   HDL 51 12/29/2019   Lab Results  Component Value Date   LDLCALC 113 (  H) 01/14/2022   LDLCALC 165 (H) 01/08/2021   LDLCALC 156 (H) 12/29/2019   Lab Results  Component Value Date   TRIG 116 01/14/2022   TRIG 139 01/08/2021   TRIG 163 (H) 12/29/2019   Lab Results  Component Value Date   CHOLHDL 3.9 01/14/2022   CHOLHDL 3.9 01/08/2021   CHOLHDL 4.6 (H) 12/29/2019      Chemistry      Component Value Date/Time   NA 139 12/26/2021 1333   NA 142 12/29/2019 1040   K 4.0 12/26/2021 1333   CL 101 12/26/2021 1333   CO2 29 12/26/2021 1333   BUN 12 12/26/2021 1333   BUN 17 12/29/2019 1040   CREATININE 0.86 12/26/2021 1333      Component Value Date/Time   CALCIUM 9.3 12/26/2021 1333   ALKPHOS 70 12/29/2019 1040   AST 17 12/26/2021 1333   ALT 16 12/26/2021 1333   BILITOT 0.4 12/26/2021 1333   BILITOT 0.6 12/29/2019 1040     Glu 141 (nonfasting)  Lab Results  Component Value Date   WBC 7.6 12/26/2021   HGB 14.1 12/26/2021   HCT 42.5 12/26/2021   MCV 93.6 12/26/2021   PLT 271 12/26/2021    ASSESSMENT/PLAN:   Per lab result, have Byrd Hesselbach add on TSH from [facility]. A1c today (nonfasting glu was 141 in July with rheumatologist). Did she ever get Cologuard  in the mail? Did she return it? (No results in computer) If not, is she willing to do now??? (Never had colon cancer screening, declined colonoscopy and agreed to cologuard in the past).   Shingrix rec Rec mammo, DEXA--will she actually do?? If so, need to re-order  Refills--Adam refilled benazepril #90 WITH a RF (??) 8/1 He also RF's the synthroid x 6 mos (inappropriate--due for TSH)  HCTZ RF is needed today  Discussed monthly self breast exams and yearly mammograms (past due); at least 30 minutes of aerobic activity at least 5 days/week, weight-bearing exercise at least 2x/week; proper sunscreen use reviewed; healthy diet, including goals of calcium and vitamin D intake and alcohol recommendations (less than or equal to 1 drink/day) reviewed; regular seatbelt use; changing batteries in smoke detectors.  Immunization recommendations discussed--continue yearly flu shots.   Updated bivalent COVID booster recommended when available in the Fall. Shingrix recommended, risks/SE reviewed. To check insurance and schedule [place] when convenient.  Colon cancer screening options were reviewed--past due.  Discussed Colonoscopy vs Cologuard  Discussed recommendation for DEXA (given h/o frequent prednisone use in past)-- Order again???? Will she do?? Pap/pelvic past due--refused.

## 2022-01-16 ENCOUNTER — Encounter: Payer: Self-pay | Admitting: Family Medicine

## 2022-01-16 ENCOUNTER — Ambulatory Visit: Payer: BC Managed Care – PPO | Admitting: Family Medicine

## 2022-01-16 VITALS — BP 110/70 | HR 64 | Ht 67.0 in | Wt 240.0 lb

## 2022-01-16 DIAGNOSIS — Z23 Encounter for immunization: Secondary | ICD-10-CM

## 2022-01-16 DIAGNOSIS — M797 Fibromyalgia: Secondary | ICD-10-CM

## 2022-01-16 DIAGNOSIS — R7301 Impaired fasting glucose: Secondary | ICD-10-CM | POA: Diagnosis not present

## 2022-01-16 DIAGNOSIS — E782 Mixed hyperlipidemia: Secondary | ICD-10-CM

## 2022-01-16 DIAGNOSIS — Z780 Asymptomatic menopausal state: Secondary | ICD-10-CM

## 2022-01-16 DIAGNOSIS — I10 Essential (primary) hypertension: Secondary | ICD-10-CM

## 2022-01-16 DIAGNOSIS — Z1211 Encounter for screening for malignant neoplasm of colon: Secondary | ICD-10-CM | POA: Diagnosis not present

## 2022-01-16 DIAGNOSIS — Z0000 Encounter for general adult medical examination without abnormal findings: Secondary | ICD-10-CM

## 2022-01-16 DIAGNOSIS — E039 Hypothyroidism, unspecified: Secondary | ICD-10-CM | POA: Diagnosis not present

## 2022-01-16 DIAGNOSIS — G4733 Obstructive sleep apnea (adult) (pediatric): Secondary | ICD-10-CM

## 2022-01-16 DIAGNOSIS — M0579 Rheumatoid arthritis with rheumatoid factor of multiple sites without organ or systems involvement: Secondary | ICD-10-CM

## 2022-01-16 LAB — POCT GLYCOSYLATED HEMOGLOBIN (HGB A1C): Hemoglobin A1C: 6 % — AB (ref 4.0–5.6)

## 2022-01-16 MED ORDER — HYDROCHLOROTHIAZIDE 25 MG PO TABS: 25.0000 mg | ORAL_TABLET | Freq: Every day | ORAL | 3 refills | Status: DC

## 2022-01-21 ENCOUNTER — Other Ambulatory Visit: Payer: Self-pay | Admitting: *Deleted

## 2022-01-21 DIAGNOSIS — E039 Hypothyroidism, unspecified: Secondary | ICD-10-CM

## 2022-02-02 ENCOUNTER — Encounter (HOSPITAL_COMMUNITY): Payer: Self-pay | Admitting: *Deleted

## 2022-02-02 ENCOUNTER — Other Ambulatory Visit: Payer: Self-pay

## 2022-02-02 ENCOUNTER — Ambulatory Visit (HOSPITAL_COMMUNITY)
Admission: EM | Admit: 2022-02-02 | Discharge: 2022-02-02 | Disposition: A | Discharge status: Home / Self Care | Payer: BC Managed Care – PPO | Attending: Physician Assistant | Admitting: Physician Assistant

## 2022-02-02 DIAGNOSIS — U071 COVID-19: Secondary | ICD-10-CM

## 2022-02-02 MED ORDER — NIRMATRELVIR/RITONAVIR (PAXLOVID)TABLET
3.0000 | ORAL_TABLET | Freq: Two times a day (BID) | ORAL | 0 refills | Status: AC
Start: 2022-02-02 — End: 2022-02-07

## 2022-02-02 MED ORDER — NIRMATRELVIR/RITONAVIR (PAXLOVID)TABLET: 3.0000 | ORAL_TABLET | Freq: Two times a day (BID) | ORAL | 0 refills | Status: DC

## 2022-02-02 NOTE — Discharge Instructions (Signed)
Please take the Paxlovid as directed.  Follow-up with your PCP if needed.  Present to the emergency department if any acutely worsening symptoms.

## 2022-02-02 NOTE — ED Provider Notes (Signed)
Jill Mathews   MRN: [id number] DOB: [birth year]  Subjective:   Jill Mathews is a 56 y.o. 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  Current Outpatient Medications:    aspirin 81 MG tablet, Take 81 mg by mouth every other day. , Disp: , Rfl:    benazepril (LOTENSIN) 10 MG tablet, TAKE 1 TABLET(10 MG) BY MOUTH DAILY, Disp: 90 tablet, Rfl: 1   DULoxetine (CYMBALTA) 30 MG capsule, TAKE ONE CAPSULE BY MOUTH TWICE DAILY, Disp: 180 capsule, Rfl: 0   fish oil-omega-3 fatty acids 1000 MG capsule, Take 1 g by mouth 2 (two) times daily., Disp: , Rfl:    hydrochlorothiazide (HYDRODIURIL) 25 MG tablet, Take 1 tablet (25 mg total) by mouth daily., Disp: 90 tablet, Rfl: 3   hydroxychloroquine (PLAQUENIL) 200 MG tablet, TAKE 1 TABLET(200 MG) BY MOUTH TWICE DAILY, Disp: 180 tablet, Rfl: 0   loratadine (CLARITIN) 10 MG tablet, Take 10 mg by mouth as needed for allergies. (Patient not taking: Reported on 01/16/2022), Disp: , Rfl:    methocarbamol (ROBAXIN) 500 MG tablet, Take 1 tablet (500 mg total) by mouth 2 (two) times daily., Disp: 60 tablet, Rfl: 0   Multiple Vitamins-Minerals (MULTIVITAMIN ADULT PO), Take by mouth daily., Disp: , Rfl:    nirmatrelvir/ritonavir EUA (PAXLOVID) 20 x 150 MG & 10 x 100MG TABS, Take 3 tablets by mouth 2 (two) times daily for 5 days., Disp: 30 tablet, Rfl: 0   SYNTHROID 137 MCG tablet, TAKE 1 TABLET(137 MCG) BY MOUTH DAILY, Disp: 90 tablet, Rfl: 1   Allergies  Allergen Reactions   Penicillins Rash    Past Medical History:  Diagnosis Date   Fibromyalgia 03/2019   put on cymbalta, and pain resolved   Hyperlipidemia     borderline; dietary management   Hypertension    Hypothyroidism 2001   Rheumatoid arthritis(714.0) 04/2006   Dr. Estanislado Pandy manages   Sleep apnea    per patient, dx by neuro     Past Surgical History:  Procedure Laterality Date   EXCISION MORTON'S NEUROMA  05/2017   right foot    PILONIDAL CYST EXCISION  1983   TONSILLECTOMY  age 49   WISDOM TOOTH EXTRACTION      Family History  Problem Relation Age of Onset   Heart disease Mother 17       MI, stents x 2; CABG age 71   Stroke Mother 72   Hyperlipidemia Mother    Hypertension Mother    Depression Mother    Diabetes Mother    Thyroid disease Mother        Graves disease, s/p RAI, now hypothyroid   Irritable bowel syndrome Mother    COPD Mother    Colon polyps Mother    Cancer Mother        skin (not melanoma); lung cancer 2021   Kidney Stones Mother    Lung cancer Mother    Stroke Father 72   Hyperlipidemia Father    Hypertension Father    Depression Father    Diabetes Father    Thyroid disease Father        hypothyroidism   COPD Father    Heart disease Father 66  MI; s/p CABG; pacemaker   Cancer Sister 47       pancreatic cancer   Bipolar disorder Sister    Cancer Maternal Grandfather        laryngeal, smoker   Heart disease Maternal Uncle        s/p CABG, late 60's   Lupus Paternal Aunt    Multiple sclerosis Cousin     Social History   Tobacco Use   Smoking status: Former    Packs/day: 1.50    Years: 26.00    Total pack years: 39.00    Types: Cigarettes    Start date: 01/08/2005    Quit date: 05/2005    Years since quitting: 16.7   Smokeless tobacco: Former    Types: Chew   Tobacco comments:    only when playing softball (2 years), when they couldn't smoke (age 50-17)  Vaping Use   Vaping Use: Never used  Substance Use Topics   Alcohol use: Yes    Comment: occasional, once a month   Drug use: Never    ROS REFER TO HPI FOR PERTINENT POSITIVES AND NEGATIVES   Objective:    Vitals: BP 108/75   Pulse (!) 57   Temp (!) 97.5 F (36.4 C)   Resp 18   LMP 01/13/2013   SpO2 99%   Physical Exam Vitals and nursing note reviewed.  Constitutional:      General: She is not in acute distress.    Appearance: Normal appearance. She is not ill-appearing.  HENT:     Head: Normocephalic.     Right Ear: Tympanic membrane, ear canal and external ear normal.     Left Ear: Tympanic membrane, ear canal and external ear normal.     Nose: Congestion present.     Mouth/Throat:     Mouth: Mucous membranes are moist.     Pharynx: No oropharyngeal exudate or posterior oropharyngeal erythema.  Eyes:     Extraocular Movements: Extraocular movements intact.     Conjunctiva/sclera: Conjunctivae normal.     Pupils: Pupils are equal, round, and reactive to light.  Cardiovascular:     Rate and Rhythm: Normal rate and regular rhythm.     Pulses: Normal pulses.     Heart sounds: Normal heart sounds. No murmur heard. Pulmonary:     Effort: Pulmonary effort is normal. No respiratory distress.     Breath sounds: Normal breath sounds. No wheezing.  Musculoskeletal:     Cervical back: Normal range of motion.  Skin:    General: Skin is warm.  Neurological:     Mental Status: She is alert and oriented to person, place, and time.  Psychiatric:        Mood and Affect: Mood normal.        Behavior: Behavior normal.     No results found for this or any previous visit (from the past 24 hour(s)).  Assessment and Plan :   PDMP not reviewed this encounter.  1. COVID-34    56 year old patient with history of HLA-B27, fibromyalgia, rheumatoid arthritis, presented to the urgent care today requesting antivirals after she tested positive for COVID-19 via home test this morning.  She is within 5-day window of symptom onset.  I do think she would benefit from antiviral treatment at this time.  She has a normal GFR.  Options discussed and we agreed on starting Paxlovid.  This medication  was sent to her pharmacy.  Possible side effects discussed with patient.  Also discussed  with her that there may be a very low interaction risk with the Plaquenil she is taking.  She is going to follow-up with her PCP next week.  Work note provided for her.  ER precautions discussed with patient.     AllwardtRanda Evens, PA-C 02/02/22 1215

## 2022-02-02 NOTE — ED Triage Notes (Signed)
Pt reports Sx's started on Thursday . Pt had a positive Home COVID test result this morning. Pt wants to to start on anti-viral med.

## 2022-02-04 ENCOUNTER — Other Ambulatory Visit: Payer: Self-pay | Admitting: *Deleted

## 2022-02-04 MED ORDER — METHOCARBAMOL 500 MG PO TABS
500.0000 mg | ORAL_TABLET | Freq: Two times a day (BID) | ORAL | 0 refills | Status: DC
Start: 2022-02-04 — End: 2022-04-30

## 2022-02-04 NOTE — Telephone Encounter (Signed)
Patient contacted the office and requested a refill on Methocarbamol.  Patient also states she was diagnosed with Covid. Patient states she is due to go back to school tomorrow. Patient would like to know what she should do if she is still testing positive.   Next Visit: 05/28/2022  Last Visit: 12/26/2021  Last Fill: 09/18/2021  Dx: Trapezius muscle spasm  Current Dose per office note on 12/26/2021:   Okay to refill Methocarbamol?

## 2022-02-04 NOTE — Telephone Encounter (Signed)
Please advise the patient to reach out to PCP.  She may be a candidate for paxlovid.

## 2022-02-04 NOTE — Telephone Encounter (Signed)
Patient advised Methocarbamol was sent to the pharmacy. Patient advised to contact PCP regarding Covid. Patient expressed understanding.

## 2022-02-05 ENCOUNTER — Telehealth: Payer: BC Managed Care – PPO | Admitting: Family Medicine

## 2022-02-05 ENCOUNTER — Encounter: Payer: Self-pay | Admitting: Family Medicine

## 2022-02-05 VITALS — HR 61 | Temp 97.5°F | Wt 232.0 lb

## 2022-02-05 DIAGNOSIS — M0579 Rheumatoid arthritis with rheumatoid factor of multiple sites without organ or systems involvement: Secondary | ICD-10-CM

## 2022-02-05 DIAGNOSIS — Z1589 Genetic susceptibility to other disease: Secondary | ICD-10-CM

## 2022-02-05 DIAGNOSIS — U071 COVID-19: Secondary | ICD-10-CM

## 2022-02-05 DIAGNOSIS — M797 Fibromyalgia: Secondary | ICD-10-CM

## 2022-02-05 NOTE — Progress Notes (Signed)
   Subjective:    Patient ID: Jill Mathews, female    DOB: July 30, 1965, 56 y.o.   MRN: [id number]  HPI Documentation for virtual audio and video telecommunications through [place] encounter: The patient was located at home. 2 patient identifiers used.  The provider was located in the office. The patient did consent to this visit and is aware of possible charges through their insurance for this visit. The other persons participating in this telemedicine service were none. Time spent on call was 5 minutes and in review of previous records >20 minutes total for counseling and coordination of care. This virtual service is not related to other E/M service within previous 7 days.  She started having fatigue and headache on August 24.  She was seen in an urgent care center on the 26th and diagnosed with COVID and placed on Paxlovid.  She does have underlying rheumatoid arthritis, HLA-B27 and fibromyalgia.  The main symptoms are fatigue, headache, confusion, cough, myalgias with some nausea.  She states that the myalgia is better but she is not ready to return to work.  No fever or shortness of breath.  Review of Systems     Objective:   Physical Exam  Alert and in no distress with appropriate affect and breathing normally      Assessment & Plan:  Fibromyalgia  HLA B27 (HLA B27 positive)  COVID  Rheumatoid arthritis involving multiple sites with positive rheumatoid factor ([place]) I do not think she is able to return to work right now and will give her a note to return to work on September 6.  If she still having difficulty at that time especially with the confusion which is probably brain fog from the [place], she will keep [place] informed

## 2022-02-07 LAB — SPECIMEN STATUS REPORT

## 2022-02-07 LAB — TSH: TSH: 4.62 u[IU]/mL — ABNORMAL HIGH (ref 0.450–4.500)

## 2022-02-12 ENCOUNTER — Encounter: Payer: Self-pay | Admitting: Neurology

## 2022-02-13 ENCOUNTER — Encounter: Payer: Self-pay | Admitting: Internal Medicine

## 2022-02-13 ENCOUNTER — Ambulatory Visit: Payer: BC Managed Care – PPO | Admitting: Neurology

## 2022-02-26 ENCOUNTER — Other Ambulatory Visit: Payer: Self-pay | Admitting: Rheumatology

## 2022-02-26 MED ORDER — PREDNISONE 5 MG PO TABS: ORAL_TABLET | ORAL | 0 refills | Status: DC

## 2022-02-26 NOTE — Telephone Encounter (Signed)
Patient advised we will be sending in a prescription for a prednisone taper. Patient advised prednisone use can cause things like weight gain, increased blood pressure, increased blood sugar and osteoporosis. Patient expressed understanding.

## 2022-02-26 NOTE — Telephone Encounter (Signed)
Patient called stating she is experiencing pain in her hands, feet, knees, shoulders and hips.  Patient requested a prescription of Prednisone to be sent to [facility] [place].

## 2022-02-26 NOTE — Telephone Encounter (Signed)
Please send a prednisone taper starting at 20 mg and taper by 5 mg every 4 days.  Please discuss side effects of prednisone.

## 2022-02-26 NOTE — Telephone Encounter (Signed)
Patient states she is having pain in her hands, feet, knees, shoulders and hips. Patient it is joint pain not muscle pain. Patient states she has some swelling in her left hand. Patient states it started yesterday about noon. Patient states she was unable to complete her workday yesterday. Patient is currently on PLQ and taking it as prescribed. Please advise.

## 2022-02-28 ENCOUNTER — Other Ambulatory Visit: Payer: Self-pay | Admitting: Physician Assistant

## 2022-02-28 NOTE — Telephone Encounter (Signed)
Next Visit: 05/28/2022  Last Visit: 12/26/2021  Last Fill: 12/04/2021  Dx: Myofascial pain  Current Dose per office note on 12/26/2021: Cymbalta 30 mg p.o. twice daily   Okay to refill Cymbalta?

## 2022-03-14 ENCOUNTER — Other Ambulatory Visit: Payer: Self-pay | Admitting: Physician Assistant

## 2022-03-14 NOTE — Telephone Encounter (Signed)
Next Visit: 05/28/2022  Last Visit: 12/26/2021  Labs: 12/26/2021 CBC and CMP are normal except glucose is elevated.    Eye exam: Not on file   Current Dose per office note 12/26/2021: plaquenil 200 mg 1 tablet by mouth twice daily   [id number] arthritis involving multiple sites with positive rheumatoid factor  Last Fill: 12/19/2021  Called patient to let them know that she needs her [place] to refill Plaquenil?

## 2022-03-19 ENCOUNTER — Encounter: Payer: Self-pay | Admitting: Internal Medicine

## 2022-04-10 DIAGNOSIS — A4902 Methicillin resistant Staphylococcus aureus infection, unspecified site: Secondary | ICD-10-CM

## 2022-04-10 HISTORY — DX: Methicillin resistant Staphylococcus aureus infection, unspecified site: A49.02

## 2022-04-24 ENCOUNTER — Other Ambulatory Visit (INDEPENDENT_AMBULATORY_CARE_PROVIDER_SITE_OTHER): Payer: BC Managed Care – PPO

## 2022-04-24 DIAGNOSIS — Z23 Encounter for immunization: Secondary | ICD-10-CM

## 2022-04-24 DIAGNOSIS — E039 Hypothyroidism, unspecified: Secondary | ICD-10-CM

## 2022-04-25 LAB — TSH: TSH: 3.69 u[IU]/mL (ref 0.450–4.500)

## 2022-04-30 ENCOUNTER — Other Ambulatory Visit: Payer: Self-pay | Admitting: Physician Assistant

## 2022-04-30 MED ORDER — DULOXETINE HCL 30 MG PO CPEP: 30.0000 mg | ORAL_CAPSULE | Freq: Two times a day (BID) | ORAL | 0 refills | Status: DC

## 2022-04-30 NOTE — Telephone Encounter (Signed)
Next Visit: 05/28/2022   Last Visit: 12/26/2021  Current Dose per office note 12/26/2021:  not discussed  Dx: Trapezius muscle spasm, Myofascial pain   Last Fill: 02/28/2022 (Cymbalta), 02/04/2022 (Methocarbamol)  Okay to refill Cymbalta and Methocarbamol?

## 2022-05-05 ENCOUNTER — Encounter (HOSPITAL_COMMUNITY): Payer: Self-pay | Admitting: Emergency Medicine

## 2022-05-05 ENCOUNTER — Ambulatory Visit (HOSPITAL_COMMUNITY)
Admission: EM | Admit: 2022-05-05 | Discharge: 2022-05-05 | Disposition: A | Discharge status: Home / Self Care | Payer: BC Managed Care – PPO | Attending: Emergency Medicine | Admitting: Emergency Medicine

## 2022-05-05 DIAGNOSIS — L03311 Cellulitis of abdominal wall: Secondary | ICD-10-CM | POA: Diagnosis not present

## 2022-05-05 MED ORDER — DOXYCYCLINE HYCLATE 100 MG PO CAPS: 100.0000 mg | ORAL_CAPSULE | Freq: Two times a day (BID) | ORAL | 0 refills | Status: DC

## 2022-05-05 NOTE — ED Provider Notes (Signed)
[place]    CSN: [id number] Arrival date & time: 05/05/22  1033      History   Chief Complaint No chief complaint on file.   HPI Jill Mathews is a 56 y.o. female. She reports she had a pair of pants rub a small blister on her belly. 5 days ago, the area became red and has now spread. It is painful. No drainage from it. Takes meds for RA that impact her immune system and is worried about this skin infection. Has been using ice packs for no relief  HPI  Past Medical History:  Diagnosis Date   Fibromyalgia 03/2019   put on cymbalta, and pain resolved   Hyperlipidemia    borderline; dietary management   Hypertension    Hypothyroidism 2001   Rheumatoid arthritis(714.0) 04/2006   Dr. 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   Fibromyalgia 01/07/2020   HLA B27 (HLA B27 positive) 10/24/2016   Prediabetes 10/31/2014   Mixed hyperlipidemia 09/09/2012   Hypothyroidism 05/25/2012   Essential hypertension, benign 05/25/2012   Rheumatoid arthritis involving multiple sites with positive rheumatoid factor ([place]) 05/25/2012   Obesity (BMI 30-39.9) 05/25/2012    Past Surgical History:  Procedure Laterality Date   EXCISION MORTON'S NEUROMA  05/2017   right foot    PILONIDAL CYST EXCISION  1983   TONSILLECTOMY  age 76   WISDOM TOOTH EXTRACTION      OB History     Gravida  0   Para  0   Term  0   Preterm  0   AB  0    Living  0      SAB  0   IAB  0   Ectopic  0   Multiple  0   Live Births               Home Medications    Prior to Admission medications   Medication Sig Start Date End Date Taking? Authorizing Provider  doxycycline (VIBRAMYCIN) 100 MG capsule Take 1 capsule (100 mg total) by mouth 2 (two) times daily. 05/05/22  Yes Carvel Getting, NP  aspirin 81 MG tablet Take 81 mg by mouth every other day.  Patient not taking: Reported on 02/05/2022    [provider]  benazepril (LOTENSIN) 10 MG tablet TAKE 1 TABLET(10 MG) BY MOUTH DAILY 01/08/22   Rita Ohara, MD  DULoxetine (CYMBALTA) 30 MG capsule Take 1 capsule (30 mg total) by mouth 2 (two) times daily. 04/30/22   Bo Merino, MD  fish oil-omega-3 fatty acids 1000 MG capsule Take 1 g by mouth 2 (two) times daily.    [provider]  hydrochlorothiazide (HYDRODIURIL) 25 MG tablet Take 1 tablet (25 mg total) by mouth daily. 01/16/22   Rita Ohara, MD  hydroxychloroquine (PLAQUENIL) 200 MG tablet TAKE 1 TABLET(200 MG) BY MOUTH TWICE DAILY 03/14/22   Bo Merino, MD  loratadine (CLARITIN) 10 MG  tablet Take 10 mg by mouth as needed for allergies. Patient not taking: Reported on 01/16/2022    [provider]  methocarbamol (ROBAXIN) 500 MG tablet TAKE 1 TABLET(500 MG) BY MOUTH TWICE DAILY 04/30/22   Bo Merino, MD  Multiple Vitamins-Minerals (MULTIVITAMIN ADULT PO) Take by mouth daily.    [provider]  predniSONE (DELTASONE) 5 MG tablet Take 4 tabs po x 4 days, 3  tabs po x 4 days, 2  tabs po x 4 days, 1  tab po x 4 days 02/26/22   Bo Merino, MD  SYNTHROID 52 MCG tablet TAKE 1 TABLET(137 MCG) BY MOUTH DAILY 01/08/22   Rita Ohara, MD    Family History Family History  Problem Relation Age of Onset   Heart disease Mother 68       MI, stents x 2; CABG age 71   Stroke Mother 70   Hyperlipidemia Mother    Hypertension Mother    Depression Mother    Diabetes Mother    Thyroid disease  Mother        Graves disease, s/p RAI, now hypothyroid   Irritable bowel syndrome Mother    COPD Mother    Colon polyps Mother    Cancer Mother        skin (not melanoma); lung cancer 2021   Kidney Stones Mother    Lung cancer Mother    Stroke Father 68   Hyperlipidemia Father    Hypertension Father    Depression Father    Diabetes Father    Thyroid disease Father        hypothyroidism   COPD Father    Heart disease Father 72       MI; s/p CABG; pacemaker   Cancer Sister 47       pancreatic cancer   Bipolar disorder Sister    Cancer Maternal Grandfather        laryngeal, smoker   Heart disease Maternal Uncle        s/p CABG, late 60's   Lupus Paternal Aunt    Multiple sclerosis Cousin     Social History Social History   Tobacco Use   Smoking status: Former    Packs/day: 1.50    Years: 26.00    Total pack years: 39.00    Types: Cigarettes    Start date: 01/08/2005    Quit date: 05/2005    Years since quitting: 16.9   Smokeless tobacco: Former    Types: Chew   Tobacco comments:    only when playing softball (2 years), when they couldn't smoke (age 49-17)  Vaping Use   Vaping Use: Never used  Substance Use Topics   Alcohol use: Yes    Comment: occasional, once a month   Drug use: Never     Allergies   Penicillins   Review of Systems Review of Systems   Physical Exam Triage Vital Signs ED Triage Vitals  Enc Vitals Group     BP 05/05/22 1139 136/85     Pulse Rate 05/05/22 1139 66     Resp 05/05/22 1139 17     Temp 05/05/22 1139 97.6 F (36.4 C)     Temp Source 05/05/22 1139 Oral     SpO2 05/05/22 1139 95 %     Weight --      Height --      Head Circumference --      Peak Flow --      Pain Score 05/05/22 1137 6  Pain Loc --      Pain Edu? --      Excl. in [place]? --    No data found.  Updated Vital Signs BP 136/85 (BP Location: Right Arm)   Pulse 66   Temp 97.6 F (36.4 C) (Oral)   Resp 17   LMP 01/13/2013   SpO2 95%   Visual  Acuity Right Eye Distance:   Left Eye Distance:   Bilateral Distance:    Right Eye Near:   Left Eye Near:    Bilateral Near:     Physical Exam Constitutional:      Appearance: Normal appearance. She is not ill-appearing.  Pulmonary:     Effort: Pulmonary effort is normal.  Skin:    General: Skin is warm and dry.     Findings: Erythema present. Rash is not urticarial.          Comments: Large area of erythema, approximately 22cm wide, on panus of abd. There is a smaller area of induration approximately 8cm wide. Indurated area is hard and tender. No areas of fluctuance.   Neurological:     Mental Status: She is alert.      UC Treatments / Results  Labs (all labs ordered are listed, but only abnormal results are displayed) Labs Reviewed - No data to display  EKG   Radiology No results found.  Procedures Procedures (including critical care time)  Medications Ordered in UC Medications - No data to display  Initial Impression / Assessment and Plan / UC Course  I have reviewed the triage vital signs and the nursing notes.  Pertinent labs & imaging results that were available during my care of the patient were reviewed by me and considered in my medical decision making (see chart for details).    Large area of cellulitis with irdurated area in center. Marked with skin pen. Pt will use warm compresses, tylenol or ibuprofen for pain, and f/u with PCP. Rx doxycycline. Reviewed reasons for seeking ER care.   Final Clinical Impressions(s) / UC Diagnoses   Final diagnoses:  Cellulitis of abdominal wall     Discharge Instructions      Call Dr. Johnsie Kindred office tomorrow to arrange follow up. Use warm compresses several times a day to help the infection heal. Monitor your infection and the marked areas on your skin - if the infection spreads rapidly or spreads far, or if you develop a fever, please go to the emergency room for care. If you develop a soft,squishy area it  might be a pocket of pus that needs to be drained.    ED Prescriptions     Medication Sig Dispense Auth. Provider   doxycycline (VIBRAMYCIN) 100 MG capsule Take 1 capsule (100 mg total) by mouth 2 (two) times daily. 20 capsule Carvel Getting, NP      PDMP not reviewed this encounter.   Carvel Getting, NP 05/05/22 1218

## 2022-05-05 NOTE — ED Triage Notes (Signed)
Pt presents with a possible infection on the right side of abdomen. States she wore a pair of pants 2 weeks ago and developed a small blister from the corner of the pants. States on Wednesday she noticed the redness spreading and pain in the area.

## 2022-05-05 NOTE — Discharge Instructions (Addendum)
Call Dr. Delford Field office tomorrow to arrange follow up. Use warm compresses several times a day to help the infection heal. Monitor your infection and the marked areas on your skin - if the infection spreads rapidly or spreads far, or if you develop a fever, please go to the emergency room for care. If you develop a soft,squishy area it might be a pocket of pus that needs to be drained.

## 2022-05-09 ENCOUNTER — Encounter: Payer: Self-pay | Admitting: Family Medicine

## 2022-05-09 ENCOUNTER — Ambulatory Visit: Payer: BC Managed Care – PPO | Admitting: Family Medicine

## 2022-05-09 VITALS — BP 120/84 | HR 64 | Temp 97.6°F | Ht 67.0 in | Wt 243.0 lb

## 2022-05-09 DIAGNOSIS — L03311 Cellulitis of abdominal wall: Secondary | ICD-10-CM | POA: Diagnosis not present

## 2022-05-09 DIAGNOSIS — L02211 Cutaneous abscess of abdominal wall: Secondary | ICD-10-CM

## 2022-05-09 NOTE — Progress Notes (Signed)
Chief Complaint  Patient presents with   Follow-up    Saw UC on 05/05/22 for abscess. They did not drain, given doxy. Drained on it's own Monday at home. Wanted to come in and follow up with you to make sure everything is healing ok. Still somewhat painful and draining, clear to yellow drainage.    Patient presents for f/u on abdominal abscess. She states it started as an irritated area of skin "carpet burn" on her lower abdomen. On 11/22 she noticed a painful bump, enlarged to the size of a quarter. By the time she got to [place] (driving), it was much larger.  Continued to grow in size, became hot, more painful. When she returned to [facility] on 11/26, she went to the UC. Provider didn't feel fluctuant area, so didn't I&D, but started her on antibiotic, warm compresses. Started draining on its own the following day. She is tolerating the antibiotic without side effects. The redness and swelling has improved, it no longer feels as hot. She has very slight "dribbling" of clear drainage, no longer draining pus or blood, denies foul odor. There is a persistent hard lump in the area above where it drained, and this concern her.  PMH, PSH, SH reviewed  Outpatient Encounter Medications as of 05/09/2022  Medication Sig Note   benazepril (LOTENSIN) 10 MG tablet TAKE 1 TABLET(10 MG) BY MOUTH DAILY    doxycycline (VIBRAMYCIN) 100 MG capsule Take 1 capsule (100 mg total) by mouth 2 (two) times daily.    DULoxetine (CYMBALTA) 30 MG capsule Take 1 capsule (30 mg total) by mouth 2 (two) times daily.    fish oil-omega-3 fatty acids 1000 MG capsule Take 1 g by mouth 2 (two) times daily.    hydrochlorothiazide (HYDRODIURIL) 25 MG tablet Take 1 tablet (25 mg total) by mouth daily.    hydroxychloroquine (PLAQUENIL) 200 MG tablet TAKE 1 TABLET(200 MG) BY MOUTH TWICE DAILY    methocarbamol (ROBAXIN) 500 MG tablet TAKE 1 TABLET(500 MG) BY MOUTH TWICE DAILY 05/09/2022: Took one last night   Multiple Vitamins-Minerals  (MULTIVITAMIN ADULT PO) Take by mouth daily.    SYNTHROID 137 MCG tablet TAKE 1 TABLET(137 MCG) BY MOUTH DAILY    loratadine (CLARITIN) 10 MG tablet Take 10 mg by mouth as needed for allergies. (Patient not taking: Reported on 01/16/2022) 02/05/2022: Prn last dose over six months ago   [DISCONTINUED] aspirin 81 MG tablet Take 81 mg by mouth every other day.  (Patient not taking: Reported on 02/05/2022) 02/05/2022: Last dose was a few days ago   [DISCONTINUED] predniSONE (DELTASONE) 5 MG tablet Take 4 tabs po x 4 days, 3  tabs po x 4 days, 2  tabs po x 4 days, 1  tab po x 4 days    No facility-administered encounter medications on file as of 05/09/2022.   Allergies  Allergen Reactions   Penicillins Rash   ROS:  no F/C. Redness/swelling/pain improving per HPI, with persistent tender hard lump. No nausea or vomiting (some initial nausea when started ABX). No diarrhea.   PHYSICAL EXAM:  BP 120/84   Pulse 64   Temp 97.6 F (36.4 C) (Tympanic)   Ht 5\' 7"  (1.702 m)   Wt 243 lb (110.2 kg)   LMP 01/13/2013   BMI 38.06 kg/m   Pleasant, well-appearing female, in good spirits, in no distress Abdomen: 7-8cm x 8cm area of of very firm induration in lower abdomen, to the right of midline, ending at skin fold. At the lower  end is where there is some serous drainage. Poss small pustule just above this that isn't draining (very superficial). The superior-most 2cm of the indurated area is clear, mild erythema to the lower portion.   ASSESSMENT/PLAN:  Cellulitis of abdominal wall - significantly improved.  Complete course of doxy  Abscess of skin of abdomen - drained spontaneously. Still has indurated area, question if persistent abscess. Warm compresses, cont doxy. f/u if worsens. Area marked in pen

## 2022-05-09 NOTE — Patient Instructions (Signed)
Continue with warm compresses and complete the antibiotics. You may or may not have another area that either drains on its own or may need help. Return if you develop a very painful, red, softer area.

## 2022-05-16 NOTE — Progress Notes (Unsigned)
Office Visit Note  [name]             Date of Birth: July 10, 1965           MRN: [id number]             PCP: Rita Ohara, MD Referring: Rita Ohara, MD Visit Date: 05/28/2022 Occupation: _0 @  Subjective:  Medication monitoring   History of Present Illness: Jill Mathews is a 56 y.o. female with history of seropositive rheumatoid arthritis and myofascial pain syndrome.  Patient is currently taking Plaquenil 200 mg 1 tablet by mouth twice daily.  Plaquenil was started in April 2023.  She continues to tolerate Plaquenil without any side effects and has not missed any doses recently.  She denies any signs or symptoms of a rheumatoid arthritis flare recently.  She continues to have intermittent arthralgias in her shoulders and ankle joints but denies any joint swelling at this time.  She has intermittent discomfort in her hands but denies any inflammation currently.  She has been taking Tylenol as needed for pain relief. Overall her myofascial pain has been more tolerable on the current treatment regimen.  She is taking Cymbalta 30 mg 1 capsule twice daily and robaxin 500 mg 1 tablet daily as needed during flares.   Patient was evaluated at urgent care on 05/05/2022 and was diagnosed with cellulitis affecting her pannus.  She was given a prescription for doxycycline which she has completed.   Activities of Daily Living:  Patient reports morning stiffness for 0 minutes.   Patient Reports nocturnal pain.  Difficulty dressing/grooming: Denies Difficulty climbing stairs: Reports Difficulty getting out of chair: Denies Difficulty using hands for taps, buttons, cutlery, and/or writing: Denies  Review of Systems  Constitutional:  Positive for fatigue.  HENT:  Positive for mouth sores. Negative for mouth dryness.   Eyes:  Positive for dryness.  Respiratory:  Negative for shortness of breath.   Cardiovascular:  Negative for chest pain and palpitations.  Gastrointestinal:   Negative for blood in stool, constipation and diarrhea.  Endocrine: Negative for increased urination.  Genitourinary:  Negative for involuntary urination.  Musculoskeletal:  Positive for joint pain and joint pain. Negative for gait problem, joint swelling, myalgias, muscle weakness, morning stiffness, muscle tenderness and myalgias.  Skin:  Negative for color change, rash, hair loss and sensitivity to sunlight.  Allergic/Immunologic: Negative for susceptible to infections.  Neurological:  Positive for dizziness and headaches.  Hematological:  Negative for swollen glands.  Psychiatric/Behavioral:  Negative for depressed mood and sleep disturbance. The patient is not nervous/anxious.     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   Fibromyalgia 01/07/2020   HLA B27 (HLA B27 positive) 10/24/2016   Prediabetes 10/31/2014   Mixed hyperlipidemia 09/09/2012   Hypothyroidism 05/25/2012   Essential hypertension, benign 05/25/2012   Rheumatoid arthritis involving multiple sites with positive rheumatoid factor ([place]) 05/25/2012   Obesity (BMI 30-39.9) 05/25/2012    Past Medical History:  Diagnosis Date   Fibromyalgia 03/2019   put  on cymbalta, and pain resolved   Hyperlipidemia    borderline; dietary management   Hypertension    Hypothyroidism 2001   MRSA infection 04/2022   Rheumatoid arthritis(714.0) 04/2006   Dr. Estanislado Pandy manages   Sleep apnea    per patient, dx by neuro    Family History  Problem  Relation Age of Onset   Heart disease Mother 8       MI, stents x 2; CABG age 36   Stroke Mother 64   Hyperlipidemia Mother    Hypertension Mother    Depression Mother    Diabetes Mother    Thyroid disease Mother        Graves disease, s/p RAI, now hypothyroid   Irritable bowel syndrome Mother    COPD Mother    Colon polyps Mother    Cancer Mother        skin (not melanoma); lung cancer 2021   Kidney Stones Mother    Lung cancer Mother    Stroke Father 71   Hyperlipidemia Father    Hypertension Father    Depression Father    Diabetes Father    Thyroid disease Father        hypothyroidism   COPD Father    Heart disease Father 38       MI; s/p CABG; pacemaker   Cancer Sister 43       pancreatic cancer   Bipolar disorder Sister    Cancer Maternal Grandfather        laryngeal, smoker   Heart disease Maternal Uncle        s/p CABG, late 60's   Lupus Paternal Aunt    Multiple sclerosis Cousin    Past Surgical History:  Procedure Laterality Date   EXCISION MORTON'S NEUROMA  05/2017   right foot    PILONIDAL CYST EXCISION  53   TONSILLECTOMY  age 24   [place] History   Social History Narrative   Lives with female partner Judson Roch) of 34 years--got married 06/11/13. 3 cats, 1 dog   2 cats, 2 dogs.   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      Updated 01/2022   Immunization History  Administered Date(s) Administered   COVID-19, mRNA, vaccine(Comirnaty)12 years and older 04/24/2022   Influenza Inj Mdck Quad Pf 04/11/2019   Influenza Split 03/10/2012   Influenza,inj,Quad PF,6+ Mos 04/11/2019, 03/01/2021, 04/24/2022   Influenza-Unspecified 03/10/2020   PFIZER Comirnaty(Gray Top)Covid-19 Tri-Sucrose Vaccine 12/27/2020   PFIZER(Purple Top)SARS-COV-2 Vaccination 08/27/2019, 09/10/2019, 01/09/2020   Pfizer Covid-19 Vaccine Bivalent Booster 1yr & up 03/01/2021   Pneumococcal Conjugate-13 11/01/2015   Pneumococcal Polysaccharide-23  07/02/2006, 12/30/2018   Tdap 09/09/2012, 01/16/2022     Objective: Vital Signs: BP 117/77 (BP Location: Left Arm, Patient Position: Sitting, Cuff Size: Large)   Pulse 64   Resp 17   Ht _0  (1.702 m)   Wt 244 lb 9.6 oz (110.9 kg)   LMP 01/13/2013   BMI 38.31 kg/m    Physical Exam Vitals and nursing note reviewed.  Constitutional:      Appearance: She is well-developed.  HENT:     Head: Normocephalic and atraumatic.  Eyes:     Conjunctiva/sclera: Conjunctivae normal.  Cardiovascular:     Rate and Rhythm: Normal rate and regular rhythm.     Heart sounds: Normal heart sounds.  Pulmonary:     Effort: Pulmonary effort is normal.     Breath sounds: Normal breath sounds.  Abdominal:     General: Bowel sounds are normal.     Palpations: Abdomen is soft.  Musculoskeletal:     Cervical back: Normal range of motion.  Lymphadenopathy:     Cervical: No cervical adenopathy.  Skin:    General: Skin is warm and dry.     Capillary Refill: Capillary refill takes less than 2 seconds.  Neurological:     Mental Status: She is alert and oriented to person, place, and time.  Psychiatric:        Behavior: Behavior normal.      Musculoskeletal Exam: C-spine, thoracic spine, lumbar spine have good range of motion.  Trapezius muscle tension tenderness bilaterally.  Shoulder joints have good range of motion with some discomfort bilaterally.  Elbow joints have good range of motion with no tenderness or inflammation.  Wrist joints, MCPs, PIPs, DIPs have good range of motion with no synovitis.  Complete fist formation bilaterally.  Hip joints have good range of motion with no groin pain.  Knee joints have good range of motion with no warmth or effusion.  Ankle joints have good range of motion with no tenderness or joint swelling.  CDAI Exam: CDAI Score: -- Patient Global: 2 mm; Provider Global: 2 mm Swollen: --; Tender: -- Joint Exam 05/28/2022   No joint exam has been documented for this  visit   There is currently no information documented on the homunculus. Go to the Rheumatology activity and complete the homunculus joint exam.  Investigation: No additional findings.  Imaging: No results found.  Recent Labs: Lab Results  Component Value Date   WBC 7.6 12/26/2021   HGB 14.1 12/26/2021   PLT 271 12/26/2021   NA 139 12/26/2021   K 4.0 12/26/2021   CL 101 12/26/2021   CO2 29 12/26/2021   GLUCOSE 141 (H) 12/26/2021   BUN 12 12/26/2021   CREATININE 0.86 12/26/2021   BILITOT 0.4 12/26/2021   ALKPHOS 70 12/29/2019   AST 17 12/26/2021   ALT 16 12/26/2021   PROT 6.2 12/26/2021   ALBUMIN 4.2 12/29/2019   CALCIUM 9.3 12/26/2021   GFRAA 85 12/12/2020   QFTBGOLD NEGATIVE 05/05/2017   QFTBGOLDPLUS NEGATIVE 07/12/2021    Speciality Comments: Prior therapy includes: Enbrel (inadequate response), Plaquenil (inadequate response)   Procedures:  No procedures performed Allergies: Penicillins   Assessment / Plan:     Visit Diagnoses: Rheumatoid arthritis involving multiple sites with positive rheumatoid factor (HCC) - +RF,+ anti-CCP: She has no joint tenderness or synovitis on examination today.  She has not had any recent rheumatoid arthritis flares.  She is clinically doing well taking Plaquenil 200 mg 1 tablet by mouth twice daily.  She is tolerating Plaquenil without any side effects and has not missed any doses recently.  She discontinued methotrexate and Humira in October 2022 due to recurrent MRSA infections.  She was diagnosed with cellulitis on 05/05/2022 and was treated with doxycycline.  No recurrence of symptoms.   She will remain on Plaquenil as prescribed.  She was advised to notify us if she develops signs or symptoms of a flare. She will follow up in 5 months or sooner if needed.    High risk medication use - Plaquenil 200 mg 1 tablet by mouth twice daily started April 2023. Discontinued Humira and methotrexate October 2022 due to recurrent MRSA  infections. Diagnosed with cellulitis on her abdomen on 05/05/2022 treated with doxycycline. CBC and CMP were drawn on 12/26/2021.  Orders for CBC and CMP were released  today. No Plaquenil eye examination on file.  Patient was advised to schedule a baseline Plaquenil eye examination with her ophthalmologist and to have results forwarded to our office.   - Plan: CBC with Differential/Platelet, COMPLETE METABOLIC PANEL WITH GFR  HLA B27 (HLA B27 positive)  Bicipital tendinitis, left shoulder - Resolved.  Primary osteoarthritis of right knee: She has good ROM of the right knee with no warmth or effusion of examination.   Myofascial pain: She experiences intermittent myalgias and muscle tenderness due to myofascial pain.  Her flares are typically exacerbated by stress and weather changes.  She remains on Cymbalta 30 mg twice daily and takes methocarbamol 500 mg 1 tablet daily as needed during flares.  A refill of methocarbamol will be sent to the pharmacy.  Trapezius muscle spasm: She experiences intermittent muscle tension and muscle spasms.  Alleviated by trigger point injections in the past.  She takes methocarbamol 500 mg 1 tablet daily as needed during flares.    Morton's neuroma of right foot: Asymptomatic at this time.  Hypersomnolence: During flares.   Other fatigue: Stable. She remains active.   Other medical conditions are listed as follows:  OSA (obstructive sleep apnea) - on CPAP  Memory loss  History of hypertension: Blood pressure was 117/77 today in the office.  History of hyperlipidemia  History of hypothyroidism  Prediabetes  Orders: Orders Placed This Encounter  Procedures   CBC with Differential/Platelet   COMPLETE METABOLIC PANEL WITH GFR   Meds ordered this encounter  Medications   methocarbamol (ROBAXIN) 500 MG tablet    Sig: Take 1 tablet (500 mg total) by mouth daily as needed for muscle spasms.    Dispense:  60 tablet    Refill:  0       Follow-Up Instructions: Return in about 5 months (around 10/27/2022) for Rheumatoid arthritis.   Ofilia Neas, PA-C  Note - This record has been created using Dragon software.  Chart creation errors have been sought, but may not always  have been located. Such creation errors do not reflect on  the standard of medical care.

## 2022-05-28 ENCOUNTER — Ambulatory Visit: Payer: BC Managed Care – PPO | Attending: Physician Assistant | Admitting: Physician Assistant

## 2022-05-28 ENCOUNTER — Encounter: Payer: Self-pay | Admitting: Physician Assistant

## 2022-05-28 VITALS — BP 117/77 | HR 64 | Resp 17 | Ht 67.0 in | Wt 244.6 lb

## 2022-05-28 DIAGNOSIS — R7303 Prediabetes: Secondary | ICD-10-CM

## 2022-05-28 DIAGNOSIS — Z1589 Genetic susceptibility to other disease: Secondary | ICD-10-CM | POA: Diagnosis not present

## 2022-05-28 DIAGNOSIS — M1711 Unilateral primary osteoarthritis, right knee: Secondary | ICD-10-CM

## 2022-05-28 DIAGNOSIS — M62838 Other muscle spasm: Secondary | ICD-10-CM

## 2022-05-28 DIAGNOSIS — G4733 Obstructive sleep apnea (adult) (pediatric): Secondary | ICD-10-CM

## 2022-05-28 DIAGNOSIS — Z79899 Other long term (current) drug therapy: Secondary | ICD-10-CM | POA: Diagnosis not present

## 2022-05-28 DIAGNOSIS — Z8679 Personal history of other diseases of the circulatory system: Secondary | ICD-10-CM

## 2022-05-28 DIAGNOSIS — Z8639 Personal history of other endocrine, nutritional and metabolic disease: Secondary | ICD-10-CM

## 2022-05-28 DIAGNOSIS — M0579 Rheumatoid arthritis with rheumatoid factor of multiple sites without organ or systems involvement: Secondary | ICD-10-CM

## 2022-05-28 DIAGNOSIS — R413 Other amnesia: Secondary | ICD-10-CM

## 2022-05-28 DIAGNOSIS — G5761 Lesion of plantar nerve, right lower limb: Secondary | ICD-10-CM

## 2022-05-28 DIAGNOSIS — M7522 Bicipital tendinitis, left shoulder: Secondary | ICD-10-CM

## 2022-05-28 DIAGNOSIS — M7918 Myalgia, other site: Secondary | ICD-10-CM

## 2022-05-28 DIAGNOSIS — R5383 Other fatigue: Secondary | ICD-10-CM

## 2022-05-28 DIAGNOSIS — G4710 Hypersomnia, unspecified: Secondary | ICD-10-CM

## 2022-05-28 MED ORDER — METHOCARBAMOL 500 MG PO TABS
500.0000 mg | ORAL_TABLET | Freq: Every day | ORAL | 0 refills | Status: DC | PRN
Start: 2022-05-28 — End: 2022-10-14

## 2022-05-28 NOTE — Progress Notes (Signed)
CBC WNL

## 2022-05-29 LAB — COMPLETE METABOLIC PANEL WITH GFR
AG Ratio: 1.8 (calc) (ref 1.0–2.5)
ALT: 22 U/L (ref 6–29)
AST: 19 U/L (ref 10–35)
Albumin: 4.3 g/dL (ref 3.6–5.1)
Alkaline phosphatase (APISO): 58 U/L (ref 37–153)
BUN: 14 mg/dL (ref 7–25)
CO2: 30 mmol/L (ref 20–32)
Calcium: 9.7 mg/dL (ref 8.6–10.4)
Chloride: 100 mmol/L (ref 98–110)
Creat: 0.77 mg/dL (ref 0.50–1.03)
Globulin: 2.4 g/dL (calc) (ref 1.9–3.7)
Glucose, Bld: 108 mg/dL — ABNORMAL HIGH (ref 65–99)
Potassium: 4.3 mmol/L (ref 3.5–5.3)
Sodium: 138 mmol/L (ref 135–146)
Total Bilirubin: 0.5 mg/dL (ref 0.2–1.2)
Total Protein: 6.7 g/dL (ref 6.1–8.1)
eGFR: 89 mL/min/{1.73_m2} (ref >=60)

## 2022-05-29 LAB — CBC WITH DIFFERENTIAL/PLATELET
Absolute Monocytes: 464 cells/uL (ref 200–950)
Basophils Absolute: 31 cells/uL (ref 0–200)
Basophils Relative: 0.5 %
Eosinophils Absolute: 122 cells/uL (ref 15–500)
Eosinophils Relative: 2 %
HCT: 41.7 % (ref 35.0–45.0)
Hemoglobin: 14.3 g/dL (ref 11.7–15.5)
Lymphs Abs: 2184 cells/uL (ref 850–3900)
MCH: 31.3 pg (ref 27.0–33.0)
MCHC: 34.3 g/dL (ref 32.0–36.0)
MCV: 91.2 fL (ref 80.0–100.0)
MPV: 10.5 fL (ref 7.5–12.5)
Monocytes Relative: 7.6 %
Neutro Abs: 3300 cells/uL (ref 1500–7800)
Neutrophils Relative %: 54.1 %
Platelets: 259 10*3/uL (ref 140–400)
RBC: 4.57 10*6/uL (ref 3.80–5.10)
RDW: 12.4 % (ref 11.0–15.0)
Total Lymphocyte: 35.8 %
WBC: 6.1 10*3/uL (ref 3.8–10.8)

## 2022-05-29 NOTE — Progress Notes (Signed)
Glucose is 108. Rest of CMP WNL

## 2022-06-08 ENCOUNTER — Other Ambulatory Visit: Payer: Self-pay | Admitting: Rheumatology

## 2022-06-11 ENCOUNTER — Encounter: Payer: Self-pay | Admitting: *Deleted

## 2022-06-11 NOTE — Telephone Encounter (Signed)
Next Visit: 10/29/2022  Last Visit: 05/28/2022  Labs: 05/28/2022 Glucose is 108. Rest of CMP WNL CBC WNL   Eye exam: not on file.    Current Dose per office note 05/28/2022: Plaquenil 200 mg 1 tablet by mouth twice daily started April 2023.   UJ:[id number] arthritis involving multiple sites with positive rheumatoid factor   Last Fill: 03/14/2022  Message sent to patient via my chart to remind her about PLQ eye exam.   Okay to refill Plaquenil?

## 2022-07-03 ENCOUNTER — Other Ambulatory Visit: Payer: Self-pay | Admitting: Rheumatology

## 2022-07-03 NOTE — Telephone Encounter (Signed)
Next Visit: 10/29/2022   Last Visit: 05/28/2022  Dx: Myofascial pain  Current Dose per office note 05/28/2022:  Cymbalta 30 mg twice daily   Last Fill: 04/30/2022  Okay to refill Cymbalta?

## 2022-07-24 ENCOUNTER — Other Ambulatory Visit: Payer: Self-pay | Admitting: Family Medicine

## 2022-07-24 DIAGNOSIS — I10 Essential (primary) hypertension: Secondary | ICD-10-CM

## 2022-07-24 DIAGNOSIS — E039 Hypothyroidism, unspecified: Secondary | ICD-10-CM

## 2022-08-02 ENCOUNTER — Encounter: Payer: Self-pay | Admitting: *Deleted

## 2022-08-02 ENCOUNTER — Other Ambulatory Visit: Payer: Self-pay | Admitting: *Deleted

## 2022-08-02 MED ORDER — PREDNISONE 5 MG PO TABS: ORAL_TABLET | ORAL | 0 refills | Status: DC

## 2022-08-02 NOTE — Telephone Encounter (Signed)
Patient advised we will be sending in a prednisone taper starting at 20 mg tapering by 5 mg every 4 days.  Patient advised to take prednisone in the morning with food and avoid the use of NSAIDs.   Patient states she would like to have a work note. Patient requested we email it to her at [email or web address].

## 2022-08-02 NOTE — Telephone Encounter (Signed)
Patient states she is having an RA flare. Patient states the flare started yesterday. Patient states she missed work yesterday and today. Patient she is having trouble with her feet, ankles, knees, hips, wrists, hands, elbows ,shoulders and neck. Patient states she is having pain. Patient states she is having swelling in her hands. Patient states the pain is not muscular. Patient states she has missed doses of her PLQ this week. Patient is requesting a prednisone taper to be sent to the [facility]. Please advise.

## 2022-08-02 NOTE — Telephone Encounter (Signed)
Ok to send in prednisone taper starting at 20 mg tapering by 5 mg every 4 days.  Take prednisone in the morning with food and avoid the use of NSAIDs.   Please clarify if she need a work note?

## 2022-08-02 NOTE — Addendum Note (Signed)
Addended by: Carole Binning on: 08/02/2022 09:46 AM   Modules accepted: Orders

## 2022-09-12 ENCOUNTER — Ambulatory Visit: Payer: BC Managed Care – PPO | Admitting: Family Medicine

## 2022-09-12 ENCOUNTER — Encounter: Payer: Self-pay | Admitting: Family Medicine

## 2022-09-12 VITALS — BP 120/78 | HR 60 | Temp 97.7°F | Ht 67.0 in | Wt 252.8 lb

## 2022-09-12 DIAGNOSIS — H00014 Hordeolum externum left upper eyelid: Secondary | ICD-10-CM | POA: Diagnosis not present

## 2022-09-12 DIAGNOSIS — L089 Local infection of the skin and subcutaneous tissue, unspecified: Secondary | ICD-10-CM

## 2022-09-12 MED ORDER — MUPIROCIN 2 % EX OINT: TOPICAL_OINTMENT | CUTANEOUS | 0 refills | Status: AC

## 2022-09-12 NOTE — Patient Instructions (Addendum)
Continue to apply warm compresses to both areas.  I suspect there is a pore/gland that is blocked.  This helps open the blockage, and allow the substance to drain, and feel better. If increasing pain or redness later today, start applying the mupirocin ointment twice daily. If this stings or bothers your eye, we can change to an erythromycin ointment.  (The mupirocin can be kept on hand to use as needed for other boils, etc as has worked well for you in the past).  Use allergy eye drops if needed for itchy eyes (to help prevent scratching/rubbing and further issues).  These medications include Patanol, Pataday, Zaditor, or less expensive ones such as Naphcon-A.

## 2022-09-12 NOTE — Progress Notes (Signed)
Chief Complaint  Patient presents with   Eye Problem    Started Easter Sunday with right eye infection lower right lid and then upper left lid. Also wants to know if you would give her an rx for mupirocin ointment to have on hand as she has been getting a lot of skin abscesses lately and has used this in the past and worked well.    3/31 was walking around in the woods.  R lower lateral eyelid started burning, feeling itchy. Then noticed it on the left upper medial eyelid. Eyes were itching when out, likely rubbed her eyes ,hands may not have been clean. Yesterday she used warm tea bags, which brought them to a head.  The right one drained white pus yesterday.  The left eye lesion drained white pus this morning.  The right one fels better. The left one is painful to touch, has waves of burning, and is itchy.   Getting abscess in her ears, nose, L groin (resolved on its own).  Has used bactroban with good results, when used on a spot below R breast.   PMH, PSH, SH  reviewed  Outpatient Encounter Medications as of 09/12/2022  Medication Sig Note   acetaminophen (TYLENOL) 500 MG tablet Take 500 mg by mouth every 6 (six) hours as needed.    benazepril (LOTENSIN) 10 MG tablet TAKE 1 TABLET(10 MG) BY MOUTH DAILY    DULoxetine (CYMBALTA) 30 MG capsule TAKE ONE CAPSULE BY MOUTH TWICE DAILY    fish oil-omega-3 fatty acids 1000 MG capsule Take 1 g by mouth 2 (two) times daily.    hydrochlorothiazide (HYDRODIURIL) 25 MG tablet Take 1 tablet (25 mg total) by mouth daily.    hydroxychloroquine (PLAQUENIL) 200 MG tablet TAKE 1 TABLET(200 MG) BY MOUTH TWICE DAILY    Multiple Vitamins-Minerals (MULTIVITAMIN ADULT PO) Take by mouth daily.    SYNTHROID 137 MCG tablet TAKE 1 TABLET(137 MCG) BY MOUTH DAILY    [DISCONTINUED] Acetaminophen (TYLENOL PO) Take by mouth as needed.    loratadine (CLARITIN) 10 MG tablet Take 10 mg by mouth as needed for allergies. (Patient not taking: Reported on 09/12/2022) 09/12/2022: As  needed   methocarbamol (ROBAXIN) 500 MG tablet Take 1 tablet (500 mg total) by mouth daily as needed for muscle spasms. (Patient not taking: Reported on 09/12/2022) 09/12/2022: As needed   mupirocin ointment (BACTROBAN) 2 % Apply to affected areas of skin twice daily as needed for infection.    [DISCONTINUED] doxycycline (VIBRAMYCIN) 100 MG capsule Take 1 capsule (100 mg total) by mouth 2 (two) times daily.    [DISCONTINUED] predniSONE (DELTASONE) 5 MG tablet Take 4 tabs po x 4 days, 3  tabs po x 4 days, 2  tabs po x 4 days, 1  tab po x 4 days    No facility-administered encounter medications on file as of 09/12/2022.   NOT using bactroban prior to today's visit  Allergies  Allergen Reactions   Penicillins Rash    ROS:  No URI symptoms, no fever, chills, no headaches, dizziness. +Allergies--sneezing, runny nose, PND/slight cough. Occasional sinus pressure, no pain. No vision changes. Moods are good.  No GI complaints or other concerns   PHYSICAL EXAM:  BP 120/78   Pulse 60   Temp 97.7 F (36.5 C)   Ht 5\' 7"  (1.702 m)   Wt 252 lb 12.8 oz (114.7 kg)   LMP 01/13/2013   BMI 39.59 kg/m   Wt Readings from Last 3 Encounters:  09/12/22 252  lb 12.8 oz (114.7 kg)  05/28/22 244 lb 9.6 oz (110.9 kg)  05/09/22 243 lb (110.2 kg)   Well-appearing female, in good spirits HEENT: conjunctiva and sclera are clear bilaterally, EOMI. No crusting or purulence noted.  Right eye: just below the lateral aspect of the lower lid there is a 1 x 0.5 cm raised, pink area, with slight crusting at the superomedial aspect (where it has previously drained). Nontender. No surrounding soft tissue swelling or erythema or warmth. Left eye: superomedially there is soft tissue swelling and redness, with small pustule (at the inner corner of the eye on the upper lid). 3-58mm in size TM's and EAc's normal OP clear. Nasal mucosa with minimal edema. Sinuses nontender Neck: no lymphadenopathy or mass Heart: regular  rate and rhythm Lungs: clear bilaterally Neuro: alert and oriented, cranial nerves grossly intact. Normal gait Psych: normal mood, affect, hygiene and grooming    ASSESSMENT/PLAN:  Hordeolum externum of left upper eyelid - continue warm compresses.  R drained/healing (appears more superficial, not stye). Can try the bactrobacn superficially, or change to erythro ointment if needed  Skin infection - periodically getting boils, okay to use bactroban prn (suspect MRSA) - Plan: mupirocin ointment (BACTROBAN) 2 %  Continue to apply warm compresses to both areas.  I suspect there is a pore/gland that is blocked.  This helps open the blockage, and allow the substance to drain, and feel better. If increasing pain or redness later today, start applying the mupirocin ointment twice daily. If this stings or bothers your eye, we can change to an erythromycin ointment.  (The mupirocin can be kept on hand to use as needed for other boils, etc as has worked well for you in the past).

## 2022-09-20 ENCOUNTER — Other Ambulatory Visit: Payer: Self-pay | Admitting: Rheumatology

## 2022-09-20 NOTE — Telephone Encounter (Signed)
Last Fill: 06/11/2022  Eye exam: not on file   Labs: 05/28/2022 Glucose is 108. Rest of CMP WNL CBC WNL   Next Visit: 10/29/2022   Last Visit: 05/28/2022   [id number] arthritis involving multiple sites with positive rheumatoid factor   Current Dose per office note 05/28/2022: Plaquenil 200 mg 1 tablet by mouth twice daily started April 2023.   Spoke with patient. She is calling to schedule eye exam and will call back to let us know when it is scheduled for.   Okay to refill Plaquenil?

## 2022-10-12 ENCOUNTER — Other Ambulatory Visit: Payer: Self-pay | Admitting: Physician Assistant

## 2022-10-14 NOTE — Telephone Encounter (Signed)
Last Fill: 05/28/2022 (Methocarbamol), 07/03/2022 (Cymbalta)  Next Visit: 10/29/2022   Last Visit: 05/28/2022   Dx: Myofascial pain   Current Dose per office note on 05/29/2023: Cymbalta 30 mg twice daily, methocarbamol 500 mg 1 tablet daily as needed during flares.   Okay to refill Methocarbamol and Cymbalta?

## 2022-10-14 NOTE — Telephone Encounter (Signed)
Attempted to contact the patient and left message for patient to call the office.  

## 2022-10-14 NOTE — Telephone Encounter (Signed)
Please clarify if she would like to switch to cymbalta 60 mg 1 capsule daily? She is currently on 30 mg BID but they are the delayed release capsules

## 2022-10-15 NOTE — Progress Notes (Unsigned)
Office Visit Note  [name]             Date of Birth: 12-07-1965           MRN: [id number]             PCP: Joselyn Arrow, MD Referring: Joselyn Arrow, MD Visit Date: 10/29/2022 Occupation: @GUAROCC @  Subjective:  Medication monitoring  History of Present Illness: Zoey Latchford is a 57 y.o. female with history of seropositive rheumatoid arthritis and myofascial pain.  Patient is taking  Plaquenil 200 mg 1 tab BID--started April 2023.  She continues to tolerate Plaquenil without any side effects and has not missed any doses recently.  Patient reports that she is scheduled to update a Plaquenil eye examination on Friday.  She denies any recent rheumatoid arthritis flares but has been having some increased discomfort in the left midfoot.  She denies any recent injury or fall.  She denies any joint swelling in the left foot.  She has tried wearing different shoes with no improvement in her symptoms.  Patient remains active performing yard work and household activities.  She has retired and will be relocating to [place] in the future. She denies any new medical conditions.  She denies any recent or recurrent infections.  Patient states that she continues to experience significant bouts of fatigue and myofascial pain especially with weather changes.  She remains on Cymbalta 30 mg 1 capsule by mouth twice daily and takes methocarbamol during flares.   Activities of Daily Living:  Patient reports morning stiffness for 5 minutes.   Patient Reports nocturnal pain.  Difficulty dressing/grooming: Denies Difficulty climbing stairs: Denies Difficulty getting out of chair: Denies Difficulty using hands for taps, buttons, cutlery, and/or writing: Denies  Review of Systems  Constitutional:  Positive for fatigue.  HENT:  Negative for mouth sores and mouth dryness.   Eyes:  Negative for dryness.  Respiratory:  Negative for shortness of breath.   Cardiovascular:  Negative for chest pain  and palpitations.  Gastrointestinal:  Negative for blood in stool, constipation and diarrhea.  Endocrine: Negative for increased urination.  Genitourinary:  Negative for involuntary urination.  Musculoskeletal:  Positive for joint pain, gait problem, joint pain, joint swelling, myalgias, morning stiffness, muscle tenderness and myalgias. Negative for muscle weakness.  Skin:  Negative for color change, rash, hair loss and sensitivity to sunlight.  Allergic/Immunologic: Positive for susceptible to infections.  Neurological:  Negative for dizziness and headaches.  Hematological:  Negative for swollen glands.  Psychiatric/Behavioral:  Negative for depressed mood and sleep disturbance. The patient is not nervous/anxious.     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 Corliss Skains manages   Sleep apnea    per patient, dx by  neuro    Family History  Problem Relation Age of Onset   Heart disease Mother 40       MI, stents x 2; CABG age 50   Stroke Mother 66   Hyperlipidemia Mother    Hypertension Mother    Depression Mother    Diabetes Mother    Thyroid disease Mother        Graves disease, s/p RAI, now hypothyroid   Irritable bowel syndrome Mother    COPD Mother    Colon polyps Mother    Cancer Mother        skin (not melanoma); lung cancer 2021   Kidney Stones Mother    Lung cancer Mother    Stroke Father 54   Hyperlipidemia Father    Hypertension Father    Depression Father    Diabetes Father    Thyroid disease Father        hypothyroidism   COPD Father    Heart disease Father 72       MI; s/p CABG; pacemaker   Cancer Sister 71       pancreatic cancer   Bipolar disorder Sister    Cancer Maternal Grandfather        laryngeal, smoker   Heart disease Maternal Uncle        s/p CABG, late 60's   Lupus Paternal Aunt    Multiple sclerosis Cousin    Past Surgical History:  Procedure Laterality Date   EXCISION MORTON'S NEUROMA  05/2017   right foot    PILONIDAL CYST EXCISION  15   TONSILLECTOMY  age 31   WISDOM TOOTH EXTRACTION     Social History   Social History Narrative   Lives with female partner Maralyn Sago) of 80 years--got married 06/11/13. 3 cats, 1 dog   2 cats, 2 dogs.   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      Updated 01/2022   Immunization History  Administered Date(s) Administered   COVID-19, mRNA, vaccine(Comirnaty)12 years and older 04/24/2022   Influenza Inj Mdck Quad Pf 04/11/2019   Influenza Split 03/10/2012   Influenza,inj,Quad PF,6+ Mos 04/11/2019, 03/01/2021, 04/24/2022   Influenza-Unspecified 03/10/2020   PFIZER Comirnaty(Gray Top)Covid-19 Tri-Sucrose Vaccine 12/27/2020   PFIZER(Purple Top)SARS-COV-2 Vaccination 08/27/2019, 09/10/2019, 01/09/2020   Pfizer Covid-19 Vaccine Bivalent Booster 12yrs & up 03/01/2021   Pneumococcal Conjugate-13 11/01/2015    Pneumococcal Polysaccharide-23 07/02/2006, 12/30/2018   Tdap 09/09/2012, 01/16/2022     Objective: Vital Signs: BP (!) 140/82 (BP Location: Left Arm, Patient Position: Sitting, Cuff Size: Large)   Pulse 80   Resp 16   Ht 5\' 7"  (1.702 m)   Wt 253 lb 9.6 oz (115 kg)   LMP 01/13/2013   BMI 39.72 kg/m    Physical Exam Vitals and nursing note reviewed.  Constitutional:      Appearance: She is well-developed.  HENT:     Head: Normocephalic and atraumatic.  Eyes:     Conjunctiva/sclera: Conjunctivae normal.  Cardiovascular:     Rate and Rhythm: Normal rate and regular rhythm.     Heart sounds: Normal heart sounds.  Pulmonary:     Effort: Pulmonary effort is normal.     Breath  sounds: Normal breath sounds.  Abdominal:     General: Bowel sounds are normal.     Palpations: Abdomen is soft.  Musculoskeletal:     Cervical back: Normal range of motion.  Lymphadenopathy:     Cervical: No cervical adenopathy.  Skin:    General: Skin is warm and dry.     Capillary Refill: Capillary refill takes less than 2 seconds.  Neurological:     Mental Status: She is alert and oriented to person, place, and time.  Psychiatric:        Behavior: Behavior normal.      Musculoskeletal Exam: C-spine, thoracic spine, lumbar spine have good range of motion.  Trapezius muscle tension tenderness bilaterally.  Shoulder joints, elbow joints, wrist joints, MCPs, PIPs, DIPs have good range of motion with no synovitis.  Complete fist formation bilaterally.  Hip joints have good range of motion with no groin pain.  Knee joints have good range of motion with no warmth or effusion.  Ankle joints have good range of motion with no joint tenderness or synovitis.  Some tenderness across the left midfoot. No tenderness or synovitis over MTP joints.  No evidence of Achilles tendinitis or plantar fasciitis.  CDAI Exam: CDAI Score: -- Patient Global: --; Provider Global: -- Swollen: --; Tender: -- Joint Exam  10/29/2022   No joint exam has been documented for this visit   There is currently no information documented on the homunculus. Go to the Rheumatology activity and complete the homunculus joint exam.  Investigation: No additional findings.  Imaging: No results found.  Recent Labs: Lab Results  Component Value Date   WBC 6.1 05/28/2022   HGB 14.3 05/28/2022   PLT 259 05/28/2022   NA 138 05/28/2022   K 4.3 05/28/2022   CL 100 05/28/2022   CO2 30 05/28/2022   GLUCOSE 108 (H) 05/28/2022   BUN 14 05/28/2022   CREATININE 0.77 05/28/2022   BILITOT 0.5 05/28/2022   ALKPHOS 70 12/29/2019   AST 19 05/28/2022   ALT 22 05/28/2022   PROT 6.7 05/28/2022   ALBUMIN 4.2 12/29/2019   CALCIUM 9.7 05/28/2022   GFRAA 85 12/12/2020   QFTBGOLD NEGATIVE 05/05/2017   QFTBGOLDPLUS NEGATIVE 07/12/2021    Speciality Comments: Prior therapy includes: Enbrel (inadequate response), Plaquenil (inadequate response)   PLQ Eye Exam scheduled for 11/01/2022 at [facility]   Procedures:  No procedures performed Allergies: Penicillins   Assessment / Plan:     Visit Diagnoses: Rheumatoid arthritis involving multiple sites with positive rheumatoid factor (HCC) - +RF,+ anti-CCP: She has no synovitis on examination today.  She has not had any signs or symptoms of a rheumatoid arthritis flare.  She has clinically been doing well taking Plaquenil 200 mg 1 tablet by mouth twice daily.  She is tolerating Plaquenil without any side effects and has not missed any doses recently.  She has had some increased discomfort in the left midfoot but no inflammation was noted at this time.  Patient declined updated x-rays at this time.  Discussed the use of Voltaren gel which she can apply topically as needed for pain relief.  She was advised to notify us if her symptoms persist or worsen.  She is not experiencing other joint pain or inflammation at this time.  She will remain on Plaquenil as monotherapy.  She was advised to  notify us if she develops signs or symptoms of a flare.  She will follow-up in the office in 5 months or sooner if  needed.  High risk medication use - Plaquenil 200 mg 1 tablet by mouth BID started April 2023.D/c Humira & MTX Oct 2022-recurrent MRSA.Dx w/ cellulitis on her abdomen on 05/05/2022 treated with doxycycline.  CBC and CMP updated on 12/19/123.  Orders for CBC and CMP released today.  Plaquenil eye exam is scheduled for 11/01/22.  She was given a Plaquenil eye examination form to take with her to her upcoming appointment.  - Plan: CBC with Differential/Platelet, COMPLETE METABOLIC PANEL WITH GFR  HLA B27 (HLA B27 positive)  Bicipital tendinitis, left shoulder: Good range of motion of the left shoulder joint on examination today.  Primary osteoarthritis of right knee: Right knee has good range of motion with no warmth or effusion.  Myofascial pain - She experiences intermittent myalgias and muscle tenderness consistent with myofascial pain.  Her symptoms are typically exacerbated by strenuous activities or weather changes.  She remains on Cymbalta 30 mg twice daily and takes methocarbamol 500 mg 1 tablet daily as needed during flares.  Trapezius muscle spasm: She underwent trapezius trigger point injections on 08/01/2021.  She continues to experience intermittent bouts of trapezius muscle tension and tenderness bilaterally.  She has muscle spasms intermittently.  She has a prescription for methocarbamol 500 mg 1 tablet daily as needed for muscle spasms.  Other medical conditions are listed as follows:  Morton's neuroma of right foot  Hypersomnolence  Other fatigue  OSA (obstructive sleep apnea)  Memory loss  History of hypertension: Blood pressure was 140/82 today in the office.  History of hyperlipidemia  History of hypothyroidism  Prediabetes  Orders: Orders Placed This Encounter  Procedures   CBC with Differential/Platelet   COMPLETE METABOLIC PANEL WITH GFR   No  orders of the defined types were placed in this encounter.  Follow-Up Instructions: Return in about 5 months (around 03/31/2023) for Rheumatoid arthritis.   Gearldine Bienenstock, PA-C  Note - This record has been created using Dragon software.  Chart creation errors have been sought, but may not always  have been located. Such creation errors do not reflect on  the standard of medical care.

## 2022-10-29 ENCOUNTER — Encounter: Payer: Self-pay | Admitting: Physician Assistant

## 2022-10-29 ENCOUNTER — Ambulatory Visit: Payer: BC Managed Care – PPO | Attending: Physician Assistant | Admitting: Physician Assistant

## 2022-10-29 VITALS — BP 140/82 | HR 80 | Resp 16 | Ht 67.0 in | Wt 253.6 lb

## 2022-10-29 DIAGNOSIS — M0579 Rheumatoid arthritis with rheumatoid factor of multiple sites without organ or systems involvement: Secondary | ICD-10-CM

## 2022-10-29 DIAGNOSIS — G4733 Obstructive sleep apnea (adult) (pediatric): Secondary | ICD-10-CM

## 2022-10-29 DIAGNOSIS — Z79899 Other long term (current) drug therapy: Secondary | ICD-10-CM

## 2022-10-29 DIAGNOSIS — M7918 Myalgia, other site: Secondary | ICD-10-CM

## 2022-10-29 DIAGNOSIS — R7303 Prediabetes: Secondary | ICD-10-CM

## 2022-10-29 DIAGNOSIS — Z1589 Genetic susceptibility to other disease: Secondary | ICD-10-CM | POA: Diagnosis not present

## 2022-10-29 DIAGNOSIS — M62838 Other muscle spasm: Secondary | ICD-10-CM

## 2022-10-29 DIAGNOSIS — M1711 Unilateral primary osteoarthritis, right knee: Secondary | ICD-10-CM

## 2022-10-29 DIAGNOSIS — R413 Other amnesia: Secondary | ICD-10-CM

## 2022-10-29 DIAGNOSIS — Z8639 Personal history of other endocrine, nutritional and metabolic disease: Secondary | ICD-10-CM

## 2022-10-29 DIAGNOSIS — M7522 Bicipital tendinitis, left shoulder: Secondary | ICD-10-CM

## 2022-10-29 DIAGNOSIS — R5383 Other fatigue: Secondary | ICD-10-CM

## 2022-10-29 DIAGNOSIS — G4710 Hypersomnia, unspecified: Secondary | ICD-10-CM

## 2022-10-29 DIAGNOSIS — Z8679 Personal history of other diseases of the circulatory system: Secondary | ICD-10-CM

## 2022-10-29 DIAGNOSIS — G5761 Lesion of plantar nerve, right lower limb: Secondary | ICD-10-CM

## 2022-10-30 LAB — CBC WITH DIFFERENTIAL/PLATELET
Absolute Monocytes: 392 cells/uL (ref 200–950)
Basophils Absolute: 28 cells/uL (ref 0–200)
Basophils Relative: 0.5 %
Eosinophils Absolute: 112 cells/uL (ref 15–500)
Eosinophils Relative: 2 %
HCT: 45.4 % — ABNORMAL HIGH (ref 35.0–45.0)
Hemoglobin: 15 g/dL (ref 11.7–15.5)
Lymphs Abs: 2520 cells/uL (ref 850–3900)
MCH: 30.8 pg (ref 27.0–33.0)
MCHC: 33 g/dL (ref 32.0–36.0)
MCV: 93.2 fL (ref 80.0–100.0)
MPV: 10.4 fL (ref 7.5–12.5)
Monocytes Relative: 7 %
Neutro Abs: 2548 cells/uL (ref 1500–7800)
Neutrophils Relative %: 45.5 %
Platelets: 301 10*3/uL (ref 140–400)
RBC: 4.87 10*6/uL (ref 3.80–5.10)
RDW: 12.5 % (ref 11.0–15.0)
Total Lymphocyte: 45 %
WBC: 5.6 10*3/uL (ref 3.8–10.8)

## 2022-10-30 LAB — COMPLETE METABOLIC PANEL WITH GFR
AG Ratio: 2 (calc) (ref 1.0–2.5)
ALT: 17 U/L (ref 6–29)
AST: 17 U/L (ref 10–35)
Albumin: 4.3 g/dL (ref 3.6–5.1)
Alkaline phosphatase (APISO): 54 U/L (ref 37–153)
BUN: 17 mg/dL (ref 7–25)
CO2: 30 mmol/L (ref 20–32)
Calcium: 9.4 mg/dL (ref 8.6–10.4)
Chloride: 104 mmol/L (ref 98–110)
Creat: 0.93 mg/dL (ref 0.50–1.03)
Globulin: 2.1 g/dL (calc) (ref 1.9–3.7)
Glucose, Bld: 112 mg/dL — ABNORMAL HIGH (ref 65–99)
Potassium: 4.2 mmol/L (ref 3.5–5.3)
Sodium: 142 mmol/L (ref 135–146)
Total Bilirubin: 0.5 mg/dL (ref 0.2–1.2)
Total Protein: 6.4 g/dL (ref 6.1–8.1)
eGFR: 71 mL/min/{1.73_m2} (ref >=60)

## 2022-10-30 NOTE — Progress Notes (Signed)
Glucose is 112. Rest of CMP WNL.  CBC WNL.

## 2023-01-17 ENCOUNTER — Other Ambulatory Visit: Payer: Self-pay | Admitting: *Deleted

## 2023-01-17 MED ORDER — HYDROXYCHLOROQUINE SULFATE 200 MG PO TABS: 200.0000 mg | ORAL_TABLET | Freq: Two times a day (BID) | ORAL | 0 refills | Status: DC

## 2023-01-17 NOTE — Telephone Encounter (Signed)
Refill request received via fax from [facility]   for Plaquenil  ,Last Fill: 09/20/2022  Eye exam: 11/01/2022 normal   Labs: 10/29/2022 Glucose is 112. Rest of CMP WNL. CBC WNL.     Next Visit: 03/31/2023  Last Visit: 10/29/2022  LK:[id number] arthritis involving multiple sites with positive rheumatoid factor   Current Dose per office note 10/29/2022: Plaquenil 200 mg 1 tablet by mouth BID    Okay to refill Plaquenil?

## 2023-01-20 ENCOUNTER — Other Ambulatory Visit: Payer: Self-pay | Admitting: Physician Assistant

## 2023-01-20 ENCOUNTER — Other Ambulatory Visit: Payer: Self-pay | Admitting: Family Medicine

## 2023-01-20 DIAGNOSIS — I10 Essential (primary) hypertension: Secondary | ICD-10-CM

## 2023-01-20 DIAGNOSIS — E039 Hypothyroidism, unspecified: Secondary | ICD-10-CM

## 2023-01-20 NOTE — Telephone Encounter (Signed)
Last Fill: 10/14/2022  Next Visit: 03/31/2023  Last Visit: 10/29/2022  Dx: Myofascial pain   Current Dose per office note on 10/29/2022: Cymbalta 30 mg twice daily   Okay to refill Cymbalta?

## 2023-01-23 ENCOUNTER — Encounter: Payer: BC Managed Care – PPO | Admitting: Family Medicine

## 2023-01-23 DIAGNOSIS — R7301 Impaired fasting glucose: Secondary | ICD-10-CM

## 2023-01-23 DIAGNOSIS — E039 Hypothyroidism, unspecified: Secondary | ICD-10-CM

## 2023-01-23 DIAGNOSIS — I10 Essential (primary) hypertension: Secondary | ICD-10-CM

## 2023-01-23 DIAGNOSIS — Z0000 Encounter for general adult medical examination without abnormal findings: Secondary | ICD-10-CM

## 2023-01-23 DIAGNOSIS — M797 Fibromyalgia: Secondary | ICD-10-CM

## 2023-01-23 DIAGNOSIS — E782 Mixed hyperlipidemia: Secondary | ICD-10-CM

## 2023-03-04 ENCOUNTER — Other Ambulatory Visit: Payer: Self-pay | Admitting: *Deleted

## 2023-03-04 MED ORDER — PREDNISONE 5 MG PO TABS: ORAL_TABLET | ORAL | 0 refills | Status: DC

## 2023-03-04 MED ORDER — METHOCARBAMOL 500 MG PO TABS
500.0000 mg | ORAL_TABLET | Freq: Every day | ORAL | 0 refills | Status: DC | PRN
Start: 2023-03-04 — End: 2023-11-06

## 2023-03-04 NOTE — Telephone Encounter (Signed)
Patient advised take prednisone in the morning and avoid the use of NSAIDs.

## 2023-03-04 NOTE — Addendum Note (Signed)
Addended by: Henriette Combs on: 03/04/2023 04:49 PM   Modules accepted: Orders

## 2023-03-04 NOTE — Telephone Encounter (Signed)
Ok to send in prednisone 20 mg tapering by 5 mg every 4 days. Take prednisone in the morning and avoid the use of NSAIDs.   Ok to refill robaxin

## 2023-03-04 NOTE — Telephone Encounter (Signed)
Patient contacted the office stating she is having a flare. Patient states her flare started yesterday. Patient states she is having trouble in her toes, ankles, knees, left wrist, left shoulder and her neck. Patient states that she is feeling worse today. Patient is on PLQ and has not missed any doses. Patient is requesting prednisone to be sent to [facility]. Patient is also requesting a refill on robaxin. Please advise.

## 2023-03-18 NOTE — Progress Notes (Signed)
Office Visit Note  [name]             Date of Birth: Jul 08, 1965           MRN: [id number]             PCP: Joselyn Arrow, MD Referring: Joselyn Arrow, MD Visit Date: 03/31/2023 Occupation: @GUAROCC @  Subjective:  Myofascial pain   History of Present Illness: Jill Mathews is a 57 y.o. female with history of seropositive rheumatoid arthritis, osteoarthritis, and myofascial pain.  Patient remains on Plaquenil 200 mg 1 tablet by mouth BID started April 2023.  She continues to tolerate Plaquenil without any side effects.  She has not missed any doses of Plaquenil recently.  Patient states she has been under increased stress recently as she has been preparing to move to a new house being built in [place].  She has also had several family members and friends passed away recently.  Patient states that overall her rheumatoid arthritis is well-controlled on Plaquenil but she continues to have recurrent fibromyalgia flares.  She states that she been taking methocarbamol at bedtime which has been helpful to alleviate muscle spasms, tension, and for insomnia.  She has also been using a heating pad at times.  Patient states she was recently working on clearing a trail and injured her left knee.  Patient states she is concerned about a possible meniscal tear.  She has been using Voltaren gel topically and taking tylenol as needed for pain relief   Activities of Daily Living:  Patient reports morning stiffness for 10 minutes.   Patient Reports nocturnal pain.  Difficulty dressing/grooming: Denies Difficulty climbing stairs: Reports Difficulty getting out of chair: Denies Difficulty using hands for taps, buttons, cutlery, and/or writing: Denies  Review of Systems  Constitutional:  Positive for fatigue.  HENT:  Positive for mouth sores. Negative for mouth dryness.        Nose sores  Eyes:  Positive for dryness.  Respiratory:  Negative for cough, shortness of breath and wheezing.    Cardiovascular:  Negative for chest pain and palpitations.  Gastrointestinal:  Negative for blood in stool, constipation and diarrhea.  Endocrine: Negative for increased urination.  Genitourinary:  Negative for involuntary urination.  Musculoskeletal:  Positive for joint pain, gait problem, joint pain, myalgias, morning stiffness, muscle tenderness and myalgias. Negative for joint swelling and muscle weakness.  Skin:  Negative for color change, rash, hair loss and sensitivity to sunlight.  Allergic/Immunologic: Positive for susceptible to infections.  Neurological:  Positive for headaches. Negative for dizziness.  Hematological:  Negative for swollen glands.  Psychiatric/Behavioral:  Positive for depressed mood and sleep disturbance. The patient is nervous/anxious.     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  Corliss Skains manages   Sleep apnea    per patient, dx by neuro    Family History  Problem Relation Age of Onset   Heart disease Mother 23       MI, stents x 2; CABG age 60   Stroke Mother 6   Hyperlipidemia Mother    Hypertension Mother    Depression Mother    Diabetes Mother    Thyroid disease Mother        Graves disease, s/p RAI, now hypothyroid   Irritable bowel syndrome Mother    COPD Mother    Colon polyps Mother    Cancer Mother        skin (not melanoma); lung cancer 2021   Kidney Stones Mother    Lung cancer Mother    Stroke Father 80   Hyperlipidemia Father    Hypertension Father    Depression Father    Diabetes Father    Thyroid disease Father        hypothyroidism   COPD Father    Heart disease Father 9       MI; s/p CABG; pacemaker   Cancer Sister 67       pancreatic cancer   Bipolar disorder Sister    Cancer Maternal Grandfather        laryngeal, smoker   Heart disease Maternal Uncle        s/p CABG, late 60's   Lupus Paternal Aunt    Multiple sclerosis Cousin    Past Surgical History:  Procedure Laterality Date   EXCISION MORTON'S NEUROMA  05/2017   right foot    PILONIDAL CYST EXCISION  51   TONSILLECTOMY  age 27   WISDOM TOOTH EXTRACTION     Social History   Social History Narrative   Lives with female partner Maralyn Sago) of 41 years--got married 06/11/13. 3 cats, 1 dog   2 cats, 2 dogs.   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      Updated 01/2022   Immunization History  Administered Date(s) Administered   Influenza Inj Mdck Quad Pf 04/11/2019   Influenza Split 03/10/2012   Influenza,inj,Quad PF,6+ Mos 04/11/2019, 03/01/2021, 04/24/2022   Influenza-Unspecified 03/10/2020   PFIZER Comirnaty(Gray Top)Covid-19 Tri-Sucrose Vaccine 12/27/2020   PFIZER(Purple Top)SARS-COV-2 Vaccination 08/27/2019, 09/10/2019, 01/09/2020   Pfizer Covid-19 Vaccine Bivalent Booster 92yrs & up 03/01/2021   Pfizer(Comirnaty)Fall Seasonal Vaccine 12 years and  older 04/24/2022   Pneumococcal Conjugate-13 11/01/2015   Pneumococcal Polysaccharide-23 07/02/2006, 12/30/2018   Tdap 09/09/2012, 01/16/2022     Objective: Vital Signs: BP 123/79 (BP Location: Left Arm, Patient Position: Sitting, Cuff Size: Large)   Pulse 71   Resp 15   Ht 5\' 7"  (1.702 m)   Wt 262 lb (118.8 kg)   LMP 01/13/2013   BMI 41.04 kg/m    Physical Exam Vitals and nursing note reviewed.  Constitutional:      Appearance: She is well-developed.  HENT:     Head: Normocephalic and atraumatic.  Eyes:     Conjunctiva/sclera: Conjunctivae normal.  Cardiovascular:     Rate and Rhythm: Normal rate and regular rhythm.     Heart sounds: Normal heart sounds.  Pulmonary:  Effort: Pulmonary effort is normal.     Breath sounds: Normal breath sounds.  Abdominal:     General: Bowel sounds are normal.     Palpations: Abdomen is soft.  Musculoskeletal:     Cervical back: Normal range of motion.  Lymphadenopathy:     Cervical: No cervical adenopathy.  Skin:    General: Skin is warm and dry.     Capillary Refill: Capillary refill takes less than 2 seconds.  Neurological:     Mental Status: She is alert and oriented to person, place, and time.  Psychiatric:        Behavior: Behavior normal.      Musculoskeletal Exam: Generalized hyperalgesia and positive tender points on exam.  C-spine has slightly limited range of motion with lateral rotation.  Trapezius muscle tension and tenderness bilaterally.  Shoulder joints, elbow joints, wrist joints, MCPs, PIPs, DIPs have good range of motion with no synovitis.  Complete fist formation bilaterally.  Hip joints have good range of motion with no groin pain.  Knee joints have good range of motion with no warmth or effusion.  Ankle joints have good range of motion with no tenderness or joint swelling.  CDAI Exam: CDAI Score: -- Patient Global: 20 / 100; Provider Global: 20 / 100 Swollen: --; Tender: -- Joint Exam 03/31/2023   No  joint exam has been documented for this visit   There is currently no information documented on the homunculus. Go to the Rheumatology activity and complete the homunculus joint exam.  Investigation: No additional findings.  Imaging: No results found.  Recent Labs: Lab Results  Component Value Date   WBC 5.6 10/29/2022   HGB 15.0 10/29/2022   PLT 301 10/29/2022   NA 142 10/29/2022   K 4.2 10/29/2022   CL 104 10/29/2022   CO2 30 10/29/2022   GLUCOSE 112 (H) 10/29/2022   BUN 17 10/29/2022   CREATININE 0.93 10/29/2022   BILITOT 0.5 10/29/2022   ALKPHOS 70 12/29/2019   AST 17 10/29/2022   ALT 17 10/29/2022   PROT 6.4 10/29/2022   ALBUMIN 4.2 12/29/2019   CALCIUM 9.4 10/29/2022   GFRAA 85 12/12/2020   QFTBGOLD NEGATIVE 05/05/2017   QFTBGOLDPLUS NEGATIVE 07/12/2021    Speciality Comments: Prior therapy includes: Enbrel (inadequate response), Plaquenil (inadequate response)   PLQ Eye 11/01/2022 normal [facility]   Visual Field scheduled 11/27/2022 f/u TBD  Procedures:  No procedures performed Allergies: Penicillins    Assessment / Plan:     Visit Diagnoses: Rheumatoid arthritis involving multiple sites with positive rheumatoid factor (HCC) - +RF,+ anti-CCP: She has no synovitis on examination today.  She is clinically doing well taking Plaquenil 200 mg 1 tablet by mouth twice daily.  Overall her rheumatoid arthritis remains well-controlled taking Plaquenil as prescribed.  She has myofascial pain exacerbated by weather changes and life stressors.  She remains on Cymbalta and is taking methocarbamol 500 mg at bedtime to help with muscle spasms and insomnia.   She continues to tolerate Plaquenil without any side effects and has not missed any doses recently.  Patient will remain on Plaquenil as monotherapy.  She will be relocating to [place] in either December or early spring 2025.  She will notify us if she needs a referral to a new rheumatologist.  She will follow-up in our  office as needed.  High risk medication use - Plaquenil 200 mg 1 tablet by mouth BID started April 2023.D/c Humira & MTX Oct 2022-recurrent MRSA.Dx w/ cellulitis 05/05/2022  treated with doxycycline.  PLQ Eye 11/01/2022 normal [facility].   CBC and CMP updated on 10/29/22.  No lab tech was available today in the office.  Future orders for CBC and CMP were placed today.  She plans on having updated lab work with her PCP. - Plan: CBC with Differential/Platelet, COMPLETE METABOLIC PANEL WITH GFR  HLA B27 (HLA B27 positive)  Bicipital tendinitis, left shoulder: Good range of motion with no discomfort currently.  Primary osteoarthritis of right knee: Good range of motion of the right knee joint noted on examination today.  No warmth or effusion noted.  Myofascial pain - She continues to experience intermittent flares of myofascial pain.  Her flares are typically exacerbated by weather change as well as stress.  She remains on Cymbalta 30 mg twice daily and takes methocarbamol 500 mg 1 tablet at bedtime.   Discussed the importance of regular exercise and good sleep hygiene.  Trapezius muscle spasm: She has trapezius muscle tension tenderness bilaterally.  She has been taking methocarbamol 500 mg 1 tablet at bedtime to help with muscle spasms and insomnia.  Morton's neuroma of right foot  Hypersomnolence  Other fatigue: Chronic.  Patient has been experiencing increased fatigue which she attributes to being under increased stress recently.  Other medical conditions are listed as follows:  OSA (obstructive sleep apnea)  Memory loss  History of hypertension: Blood pressure was 123/79 today in the office.  History of hyperlipidemia  History of hypothyroidism  Prediabetes  Orders: Orders Placed This Encounter  Procedures   CBC with Differential/Platelet   COMPLETE METABOLIC PANEL WITH GFR   No orders of the defined types were placed in this encounter.   Follow-Up Instructions: Return  if symptoms worsen or fail to improve, for Rheumatoid arthritis, Osteoarthritis.   Gearldine Bienenstock, PA-C  Note - This record has been created using Dragon software.  Chart creation errors have been sought, but may not always  have been located. Such creation errors do not reflect on  the standard of medical care.

## 2023-03-26 ENCOUNTER — Other Ambulatory Visit: Payer: Self-pay | Admitting: Family Medicine

## 2023-03-26 DIAGNOSIS — E039 Hypothyroidism, unspecified: Secondary | ICD-10-CM

## 2023-03-26 NOTE — Telephone Encounter (Signed)
Pt. Called back stating she had one pill left and needed it refilled before her apt. On 04/09/23

## 2023-03-31 ENCOUNTER — Encounter: Payer: Self-pay | Admitting: Physician Assistant

## 2023-03-31 ENCOUNTER — Ambulatory Visit: Payer: BC Managed Care – PPO | Attending: Physician Assistant | Admitting: Physician Assistant

## 2023-03-31 VITALS — BP 123/79 | HR 71 | Resp 15 | Ht 67.0 in | Wt 262.0 lb

## 2023-03-31 DIAGNOSIS — R7303 Prediabetes: Secondary | ICD-10-CM

## 2023-03-31 DIAGNOSIS — M0579 Rheumatoid arthritis with rheumatoid factor of multiple sites without organ or systems involvement: Secondary | ICD-10-CM

## 2023-03-31 DIAGNOSIS — Z79899 Other long term (current) drug therapy: Secondary | ICD-10-CM | POA: Diagnosis not present

## 2023-03-31 DIAGNOSIS — Z8639 Personal history of other endocrine, nutritional and metabolic disease: Secondary | ICD-10-CM

## 2023-03-31 DIAGNOSIS — G5761 Lesion of plantar nerve, right lower limb: Secondary | ICD-10-CM

## 2023-03-31 DIAGNOSIS — R5383 Other fatigue: Secondary | ICD-10-CM

## 2023-03-31 DIAGNOSIS — M1711 Unilateral primary osteoarthritis, right knee: Secondary | ICD-10-CM

## 2023-03-31 DIAGNOSIS — Z8679 Personal history of other diseases of the circulatory system: Secondary | ICD-10-CM

## 2023-03-31 DIAGNOSIS — M7522 Bicipital tendinitis, left shoulder: Secondary | ICD-10-CM | POA: Diagnosis not present

## 2023-03-31 DIAGNOSIS — Z1589 Genetic susceptibility to other disease: Secondary | ICD-10-CM

## 2023-03-31 DIAGNOSIS — G4733 Obstructive sleep apnea (adult) (pediatric): Secondary | ICD-10-CM

## 2023-03-31 DIAGNOSIS — M7918 Myalgia, other site: Secondary | ICD-10-CM

## 2023-03-31 DIAGNOSIS — G4710 Hypersomnia, unspecified: Secondary | ICD-10-CM

## 2023-03-31 DIAGNOSIS — R413 Other amnesia: Secondary | ICD-10-CM

## 2023-03-31 DIAGNOSIS — M62838 Other muscle spasm: Secondary | ICD-10-CM

## 2023-04-07 ENCOUNTER — Telehealth: Payer: Self-pay | Admitting: *Deleted

## 2023-04-07 DIAGNOSIS — Z5181 Encounter for therapeutic drug level monitoring: Secondary | ICD-10-CM

## 2023-04-07 DIAGNOSIS — E039 Hypothyroidism, unspecified: Secondary | ICD-10-CM

## 2023-04-07 DIAGNOSIS — E782 Mixed hyperlipidemia: Secondary | ICD-10-CM

## 2023-04-07 NOTE — Telephone Encounter (Signed)
Confirmed CPE for Wed. She wants to come tomorrow am for fasting labs. Her rheum has orders for CMET and CBC in system so wants to make sure we do those. Guess we need to re-order. If you send back to me I can order all first thing tomorrow, thanks.

## 2023-04-07 NOTE — Telephone Encounter (Signed)
Added lipids and TSH. You don't have to re-order.  Do what Martie Lee did so that it releases directly to rheum. I can look at those when I get her other labs (even though they won't be in my inbasket.) Or you can reorder under me, and we can forward the results. Either way

## 2023-04-08 ENCOUNTER — Other Ambulatory Visit: Payer: Self-pay | Admitting: Internal Medicine

## 2023-04-08 ENCOUNTER — Other Ambulatory Visit: Payer: BC Managed Care – PPO

## 2023-04-08 DIAGNOSIS — E782 Mixed hyperlipidemia: Secondary | ICD-10-CM

## 2023-04-08 DIAGNOSIS — Z79899 Other long term (current) drug therapy: Secondary | ICD-10-CM

## 2023-04-08 DIAGNOSIS — E039 Hypothyroidism, unspecified: Secondary | ICD-10-CM

## 2023-04-08 DIAGNOSIS — Z5181 Encounter for therapeutic drug level monitoring: Secondary | ICD-10-CM

## 2023-04-08 NOTE — Patient Instructions (Incomplete)
  HEALTH MAINTENANCE RECOMMENDATIONS:   It is recommended that you get at least 30 minutes of aerobic exercise at least 5 days/week (for weight loss, you may need as much as 60-90 minutes). This can be any activity that gets your heart rate up. This can be divided in 10-15 minute intervals if needed, but try and build up your endurance at least once a week.  Weight bearing exercise is also recommended twice weekly.   Eat a healthy diet with lots of vegetables, fruits and fiber.  "Colorful" foods have a lot of vitamins (ie green vegetables, tomatoes, red peppers, etc).  Limit sweet tea, regular sodas and alcoholic beverages, all of which has a lot of calories and sugar.  Up to 1 alcoholic drink daily may be beneficial for women (unless trying to lose weight, watch sugars).  Drink a lot of water.   Calcium recommendations are 1200-1500 mg daily (1500 mg for postmenopausal women or women without ovaries), and vitamin D 1000 IU daily.  This should be obtained from diet and/or supplements (vitamins), and calcium should not be taken all at once, but in divided doses.   Monthly self breast exams and yearly mammograms for women over the age of 42 is recommended.   Sunscreen of at least SPF 30 should be used on all sun-exposed parts of the skin when outside between the hours of 10 am and 4 pm (not just when at beach or pool, but even with exercise, golf, tennis, and yard work!)  Use a sunscreen that says "broad spectrum" so it covers both UVA and UVB rays, and make sure to reapply every 1-2 hours.   Remember to change the batteries in your smoke detectors when changing your clock times in the spring and fall. Carbon monoxide detectors are recommended for your home.   Use your seat belt every time you are in a car, and please drive safely and not be distracted with cell phones and texting while driving.     You are past due for colon cancer screening. We discussed options including colonoscopy, versus  Cologuard (every 3 years, if normal, need to have colonoscopy if abnormal Cologuard), and FIT testing (yearly test).     I recommend getting the new shingles vaccine (Shingrix). Please schedule a nurse visit at our office when convenient, waiting 2 weeks from today's vaccines. This is a series of 2 injections, spaced 2 months apart.    Last year we discussed and ordered bone density test. This order is still in the system for you to [facility] (you call to schedule it yourself).   If it is normal, you won't need another for probably 10 years. If it shows thinning of the bones, we might monitor more often. Mammogram is also recommended, to evaluate for breast cancer.  If interested, you can try and arrange for both of these to be done on the same day at the [facility].
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
## 2023-04-08 NOTE — Addendum Note (Signed)
Addended by: Herminio Commons A on: 04/08/2023 08:02 AM   Modules accepted: Orders

## 2023-04-09 ENCOUNTER — Encounter: Payer: Self-pay | Admitting: Family Medicine

## 2023-04-09 ENCOUNTER — Ambulatory Visit: Payer: BC Managed Care – PPO | Admitting: Family Medicine

## 2023-04-09 VITALS — BP 120/80 | HR 76 | Ht 68.0 in | Wt 262.4 lb

## 2023-04-09 DIAGNOSIS — G4733 Obstructive sleep apnea (adult) (pediatric): Secondary | ICD-10-CM

## 2023-04-09 DIAGNOSIS — Z23 Encounter for immunization: Secondary | ICD-10-CM | POA: Diagnosis not present

## 2023-04-09 DIAGNOSIS — Z780 Asymptomatic menopausal state: Secondary | ICD-10-CM

## 2023-04-09 DIAGNOSIS — M797 Fibromyalgia: Secondary | ICD-10-CM

## 2023-04-09 DIAGNOSIS — Z5181 Encounter for therapeutic drug level monitoring: Secondary | ICD-10-CM

## 2023-04-09 DIAGNOSIS — Z0000 Encounter for general adult medical examination without abnormal findings: Secondary | ICD-10-CM

## 2023-04-09 DIAGNOSIS — E039 Hypothyroidism, unspecified: Secondary | ICD-10-CM | POA: Diagnosis not present

## 2023-04-09 DIAGNOSIS — R7301 Impaired fasting glucose: Secondary | ICD-10-CM | POA: Diagnosis not present

## 2023-04-09 DIAGNOSIS — M0579 Rheumatoid arthritis with rheumatoid factor of multiple sites without organ or systems involvement: Secondary | ICD-10-CM

## 2023-04-09 DIAGNOSIS — Z1211 Encounter for screening for malignant neoplasm of colon: Secondary | ICD-10-CM

## 2023-04-09 DIAGNOSIS — I10 Essential (primary) hypertension: Secondary | ICD-10-CM | POA: Diagnosis not present

## 2023-04-09 DIAGNOSIS — G43109 Migraine with aura, not intractable, without status migrainosus: Secondary | ICD-10-CM

## 2023-04-09 DIAGNOSIS — E782 Mixed hyperlipidemia: Secondary | ICD-10-CM

## 2023-04-09 LAB — CBC WITH DIFFERENTIAL/PLATELET
Basophils Absolute: 0 10*3/uL (ref 0.0–0.2)
Basos: 0 %
EOS (ABSOLUTE): 0.1 10*3/uL (ref 0.0–0.4)
Eos: 2 %
Hematocrit: 43.9 % (ref 34.0–46.6)
Hemoglobin: 14.3 g/dL (ref 11.1–15.9)
Immature Grans (Abs): 0 10*3/uL (ref 0.0–0.1)
Immature Granulocytes: 0 %
Lymphocytes Absolute: 3 10*3/uL (ref 0.7–3.1)
Lymphs: 43 %
MCH: 30.9 pg (ref 26.6–33.0)
MCHC: 32.6 g/dL (ref 31.5–35.7)
MCV: 95 fL (ref 79–97)
Monocytes Absolute: 0.4 10*3/uL (ref 0.1–0.9)
Monocytes: 6 %
Neutrophils Absolute: 3.4 10*3/uL (ref 1.4–7.0)
Neutrophils: 49 %
Platelets: 301 10*3/uL (ref 150–450)
RBC: 4.63 x10E6/uL (ref 3.77–5.28)
RDW: 12.5 % (ref 11.7–15.4)
WBC: 7 10*3/uL (ref 3.4–10.8)

## 2023-04-09 LAB — COMPREHENSIVE METABOLIC PANEL
ALT: 17 IU/L (ref 0–32)
AST: 18 IU/L (ref 0–40)
Albumin: 4.1 g/dL (ref 3.8–4.9)
Alkaline Phosphatase: 73 [IU]/L (ref 44–121)
BUN/Creatinine Ratio: 19 (ref 9–23)
BUN: 18 mg/dL (ref 6–24)
Bilirubin Total: 0.3 mg/dL (ref 0.0–1.2)
CO2: 26 mmol/L (ref 20–29)
Calcium: 9.5 mg/dL (ref 8.7–10.2)
Chloride: 102 mmol/L (ref 96–106)
Creatinine, Ser: 0.97 mg/dL (ref 0.57–1.00)
Globulin, Total: 2 g/dL (ref 1.5–4.5)
Glucose: 106 mg/dL — ABNORMAL HIGH (ref 70–99)
Potassium: 4.6 mmol/L (ref 3.5–5.2)
Sodium: 141 mmol/L (ref 134–144)
Total Protein: 6.1 g/dL (ref 6.0–8.5)
eGFR: 67 mL/min/{1.73_m2} (ref >59)

## 2023-04-09 LAB — LIPID PANEL
Chol/HDL Ratio: 4.6 ratio — ABNORMAL HIGH (ref 0.0–4.4)
Cholesterol, Total: 224 mg/dL — ABNORMAL HIGH (ref 100–199)
HDL: 49 mg/dL (ref >39)
LDL Chol Calc (NIH): 148 mg/dL — ABNORMAL HIGH (ref 0–99)
Triglycerides: 152 mg/dL — ABNORMAL HIGH (ref 0–149)
VLDL Cholesterol Cal: 27 mg/dL (ref 5–40)

## 2023-04-09 LAB — TSH: TSH: 4.25 u[IU]/mL (ref 0.450–4.500)

## 2023-04-09 MED ORDER — ROSUVASTATIN CALCIUM 5 MG PO TABS: 5.0000 mg | ORAL_TABLET | Freq: Every day | ORAL | 2 refills | Status: AC

## 2023-04-09 MED ORDER — BENAZEPRIL HCL 10 MG PO TABS
10.0000 mg | ORAL_TABLET | Freq: Every day | ORAL | 3 refills | Status: DC
Start: 2023-04-09 — End: 2024-04-05

## 2023-04-09 MED ORDER — SUMATRIPTAN SUCCINATE 50 MG PO TABS: ORAL_TABLET | ORAL | 0 refills | Status: AC

## 2023-04-09 MED ORDER — HYDROCHLOROTHIAZIDE 25 MG PO TABS: 25.0000 mg | ORAL_TABLET | Freq: Every day | ORAL | 3 refills | Status: AC

## 2023-04-09 MED ORDER — DULOXETINE HCL 30 MG PO CPEP: 30.0000 mg | ORAL_CAPSULE | Freq: Two times a day (BID) | ORAL | 3 refills | Status: DC

## 2023-04-09 MED ORDER — SYNTHROID 150 MCG PO TABS: 150.0000 ug | ORAL_TABLET | Freq: Every day | ORAL | 2 refills | Status: DC

## 2023-04-09 NOTE — Progress Notes (Signed)
Glucose is 106. Rest of CMP WNL.  CBC WNL

## 2023-04-10 ENCOUNTER — Encounter: Payer: Self-pay | Admitting: Family Medicine

## 2023-04-24 ENCOUNTER — Other Ambulatory Visit: Payer: Self-pay | Admitting: Rheumatology

## 2023-04-24 NOTE — Telephone Encounter (Signed)
Last Fill: 01/17/2023  Eye exam: 11/01/2022 normal   Labs: 04/08/2023 Glucose is 106. Rest of CMP WNL. CBC WNL.   Next Visit: She will be relocating to [place] in either December or early spring 2025   Last Visit: 03/31/2023  DX: Rheumatoid arthritis involving multiple sites with positive rheumatoid factor   Current Dose per office note 03/31/2023: Plaquenil 200 mg 1 tablet by mouth BID   Okay to refill Plaquenil?

## 2023-06-17 ENCOUNTER — Other Ambulatory Visit: Payer: 59

## 2023-06-17 DIAGNOSIS — Z5181 Encounter for therapeutic drug level monitoring: Secondary | ICD-10-CM

## 2023-06-17 DIAGNOSIS — R7301 Impaired fasting glucose: Secondary | ICD-10-CM

## 2023-06-17 DIAGNOSIS — E039 Hypothyroidism, unspecified: Secondary | ICD-10-CM

## 2023-06-17 DIAGNOSIS — E782 Mixed hyperlipidemia: Secondary | ICD-10-CM

## 2023-06-18 ENCOUNTER — Other Ambulatory Visit: Payer: 59

## 2023-06-19 ENCOUNTER — Other Ambulatory Visit: Payer: Self-pay | Admitting: *Deleted

## 2023-06-19 DIAGNOSIS — E039 Hypothyroidism, unspecified: Secondary | ICD-10-CM

## 2023-06-19 LAB — GLUCOSE, RANDOM: Glucose: 102 mg/dL — ABNORMAL HIGH (ref 70–99)

## 2023-06-19 LAB — HEPATIC FUNCTION PANEL
ALT: 18 [IU]/L (ref 0–32)
AST: 18 [IU]/L (ref 0–40)
Albumin: 4 g/dL (ref 3.8–4.9)
Alkaline Phosphatase: 68 [IU]/L (ref 44–121)
Bilirubin Total: 0.5 mg/dL (ref 0.0–1.2)
Bilirubin, Direct: 0.15 mg/dL (ref 0.00–0.40)
Total Protein: 6.1 g/dL (ref 6.0–8.5)

## 2023-06-19 LAB — TSH: TSH: 1.62 u[IU]/mL (ref 0.450–4.500)

## 2023-06-19 LAB — HEMOGLOBIN A1C
Est. average glucose Bld gHb Est-mCnc: 137 mg/dL
Hgb A1c MFr Bld: 6.4 % — ABNORMAL HIGH (ref 4.8–5.6)

## 2023-06-19 LAB — LIPID PANEL
Chol/HDL Ratio: 4.1 {ratio} (ref 0.0–4.4)
Cholesterol, Total: 201 mg/dL — ABNORMAL HIGH (ref 100–199)
HDL: 49 mg/dL (ref >39)
LDL Chol Calc (NIH): 123 mg/dL — ABNORMAL HIGH (ref 0–99)
Triglycerides: 163 mg/dL — ABNORMAL HIGH (ref 0–149)
VLDL Cholesterol Cal: 29 mg/dL (ref 5–40)

## 2023-06-19 MED ORDER — SYNTHROID 150 MCG PO TABS: 150.0000 ug | ORAL_TABLET | Freq: Every day | ORAL | 1 refills | Status: DC

## 2023-06-23 ENCOUNTER — Other Ambulatory Visit: Payer: Self-pay | Admitting: *Deleted

## 2023-06-23 DIAGNOSIS — E782 Mixed hyperlipidemia: Secondary | ICD-10-CM

## 2023-07-12 ENCOUNTER — Other Ambulatory Visit: Payer: Self-pay | Admitting: Rheumatology

## 2023-07-14 ENCOUNTER — Other Ambulatory Visit: Payer: Self-pay | Admitting: Rheumatology

## 2023-07-14 MED ORDER — PREDNISONE 5 MG PO TABS: ORAL_TABLET | ORAL | 0 refills | Status: DC

## 2023-07-14 NOTE — Telephone Encounter (Signed)
Returned call to patient. She states she is having pain in her neck, shoulders, wrist, hands, and hips. Patient states she is having swelling in her knuckles and wrist. Patient states the flare started about a week ago. Patient states she is taking her PLQ as prescribed. Patient is requesting a prescription for prednisone. Please advise.

## 2023-07-14 NOTE — Telephone Encounter (Signed)
Patient left a voicemail stating she is having pain in her neck, shoulders, wrist, hands, and hips and would like a prescription for Prednisone to be sent to [facility] on [address].

## 2023-07-14 NOTE — Telephone Encounter (Signed)
Last Fill: 04/24/2023  Eye exam: 11/01/2022 normal    Labs: 04/08/2023 Glucose is 106. Rest of CMP WNL. CBC WNL.   Next Visit: She will be relocating to [place] in either December or early spring 2025   Last Visit: 03/31/2023  UR:[id number] arthritis involving multiple sites with positive rheumatoid factor   Current Dose per office note 03/31/2023: Plaquenil 200 mg 1 tablet by mouth BID   Okay to refill Plaquenil?

## 2023-07-14 NOTE — Telephone Encounter (Signed)
Okay to send a prednisone taper starting at 20 mg and taper by 5 mg every 4 days.  I do not see a follow-up appointment.  Please schedule an appointment.

## 2023-07-14 NOTE — Telephone Encounter (Signed)
Patient advised we are sending a prednisone taper to the pharmacy for her. Patient scheduled an appointment for 07/21/2023.

## 2023-07-15 NOTE — Progress Notes (Signed)
 Office Visit Note  [name]             Date of Birth: 04-20-1966           MRN: [id number]             PCP: Roosvelt Colla, MD Referring: Roosvelt Colla, MD Visit Date: 07/21/2023 Occupation: @GUAROCC @  Subjective:  Left hip pain   History of Present Illness: Jill Mathews is a 58 y.o. female with history of seropositive rheumatoid arthritis and myofascial pain.  Patient remains on Plaquenil  200 mg 1 tablet by mouth BID started April 2023.  She continues to tolerate Plaquenil  without any side effects and has not missed any doses recently.  Patient reports last week she was experiencing a flare involving both hands.  She was having pain and swelling but did not end up taking the prednisone  taper sent to the pharmacy on 07/14/2023.  Patient states that she has started to take [facility] Gummies which has helped both her joint pain and myofascial pain.  She has been sleeping better at night as well.  Patient will be starting a weightlifting program tomorrow.  She is also been walking 2 to 3 miles on a daily basis for exercise.  She continues to have ongoing pain in the left hip specifically in the groin area.  She denies any recent injury or fall.  She denies any new medical conditions.   Activities of Daily Living:  Patient reports morning stiffness for 10 minutes.   Patient Reports nocturnal pain.  Difficulty dressing/grooming: Denies Difficulty climbing stairs: Reports Difficulty getting out of chair: Denies Difficulty using hands for taps, buttons, cutlery, and/or writing: Denies  Review of Systems  Constitutional:  Positive for fatigue.  HENT:  Positive for mouth sores and nose dryness. Negative for mouth dryness.   Eyes:  Positive for dryness. Negative for pain.  Respiratory:  Negative for shortness of breath and difficulty breathing.   Cardiovascular:  Negative for chest pain and palpitations.  Gastrointestinal:  Negative for blood in stool, constipation and diarrhea.   Endocrine: Negative for increased urination.  Genitourinary:  Negative for involuntary urination.  Musculoskeletal:  Positive for joint pain, joint pain, joint swelling and morning stiffness. Negative for gait problem, myalgias, muscle weakness, muscle tenderness and myalgias.  Skin:  Negative for color change, rash, hair loss and sensitivity to sunlight.  Allergic/Immunologic: Negative for susceptible to infections.  Neurological:  Negative for dizziness and headaches.  Hematological:  Negative for swollen glands.  Psychiatric/Behavioral:  Negative for depressed mood and sleep disturbance. The patient is not nervous/anxious.     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 Alvira Josephs manages   Sleep apnea    per patient, dx by neuro    Family History  Problem Relation Age of Onset   Heart disease Mother 65       MI,  stents x 2; CABG age 50   Stroke Mother 4   Hyperlipidemia Mother    Hypertension Mother    Depression Mother    Diabetes Mother    Thyroid disease Mother        Graves disease, s/p RAI, now hypothyroid   Irritable bowel syndrome Mother    COPD Mother    Colon polyps Mother    Cancer Mother        skin (not melanoma); lung cancer 2021   Kidney Stones Mother    Lung cancer Mother    Stroke Father 62   Hyperlipidemia Father    Hypertension Father    Depression Father    Diabetes Father    Thyroid disease Father        hypothyroidism   COPD Father    Heart disease Father 11       MI; s/p CABG; pacemaker   Cancer Sister 80       pancreatic cancer   Bipolar disorder Sister    Cancer Maternal Grandfather        laryngeal, smoker   Heart disease Maternal Uncle        s/p CABG, late 60's   Lupus Paternal Aunt    Multiple sclerosis Cousin    Past Surgical History:  Procedure Laterality Date   EXCISION MORTON'S NEUROMA  05/2017   right foot    PILONIDAL CYST EXCISION  80   TONSILLECTOMY  age 40   WISDOM TOOTH EXTRACTION     Social History   Social History Narrative   Lives with female partner Isa Manuel) of 89 years--got married 06/11/13. 3 cats, 1 dog   2 cats, 2 dogs.   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      Updated 01/2022   Immunization History  Administered Date(s) Administered   Fluad Trivalent(High Dose 65+) 04/09/2023   Influenza Inj Mdck Quad Pf 04/11/2019   Influenza Split 03/10/2012   Influenza,inj,Quad PF,6+ Mos 04/11/2019, 03/01/2021, 04/24/2022   Influenza-Unspecified 03/10/2020   PFIZER Comirnaty(Gray Top)Covid-19 Tri-Sucrose Vaccine 12/27/2020   PFIZER(Purple Top)SARS-COV-2 Vaccination 08/27/2019, 09/10/2019, 01/09/2020   Pfizer Covid-19 Vaccine Bivalent Booster 52yrs & up 03/01/2021   Pfizer(Comirnaty)Fall Seasonal Vaccine 12 years and older 04/24/2022, 04/09/2023   Pneumococcal Conjugate-13 11/01/2015   Pneumococcal Polysaccharide-23  07/02/2006, 12/30/2018   Tdap 09/09/2012, 01/16/2022     Objective: Vital Signs: BP 130/74 (BP Location: Left Arm, Patient Position: Sitting, Cuff Size: Large)   Pulse 70   Resp 16   Ht 5\' 7"  (1.702 m)   Wt 266 lb 3.2 oz (120.7 kg)   LMP 01/13/2013   BMI 41.69 kg/m    Physical Exam Vitals and nursing note reviewed.  Constitutional:      Appearance: She is well-developed.  HENT:     Head: Normocephalic and atraumatic.  Eyes:     Conjunctiva/sclera: Conjunctivae normal.  Cardiovascular:     Rate and Rhythm: Normal rate and regular rhythm.     Heart sounds: Normal heart sounds.  Pulmonary:     Effort: Pulmonary effort is normal.  Breath sounds: Normal breath sounds.  Abdominal:     General: Bowel sounds are normal.     Palpations: Abdomen is soft.  Musculoskeletal:     Cervical back: Normal range of motion.  Lymphadenopathy:     Cervical: No cervical adenopathy.  Skin:    General: Skin is warm and dry.     Capillary Refill: Capillary refill takes less than 2 seconds.  Neurological:     Mental Status: She is alert and oriented to person, place, and time.  Psychiatric:        Behavior: Behavior normal.      Musculoskeletal Exam: C-spine, thoracic spine, and lumbar spine good ROM.  Shoulder joints, elbow joints, wrist joints, MCPs, PIPs, DIPs have good range of motion with no synovitis.  Complete fist formation bilaterally.  Painful and slightly limited range of motion of the left hip.  Right hip has good range of motion with no discomfort.  No tenderness over the trochanteric bursa bilaterally.  Knee joints have good range of motion no warmth or effusion.  CDAI Exam: CDAI Score: -- Patient Global: --; Provider Global: -- Swollen: --; Tender: -- Joint Exam 07/21/2023   No joint exam has been documented for this visit   There is currently no information documented on the homunculus. Go to the Rheumatology activity and complete the homunculus joint  exam.  Investigation: No additional findings.  Imaging: No results found.  Recent Labs: Lab Results  Component Value Date   WBC 7.0 04/08/2023   HGB 14.3 04/08/2023   PLT 301 04/08/2023   NA 141 04/08/2023   K 4.6 04/08/2023   CL 102 04/08/2023   CO2 26 04/08/2023   GLUCOSE 102 (H) 06/18/2023   BUN 18 04/08/2023   CREATININE 0.97 04/08/2023   BILITOT 0.5 06/18/2023   ALKPHOS 68 06/18/2023   AST 18 06/18/2023   ALT 18 06/18/2023   PROT 6.1 06/18/2023   ALBUMIN 4.0 06/18/2023   CALCIUM  9.5 04/08/2023   GFRAA 85 12/12/2020   QFTBGOLD NEGATIVE 05/05/2017   QFTBGOLDPLUS NEGATIVE 07/12/2021    Speciality Comments: Prior therapy includes: Enbrel  (inadequate response), Plaquenil  (inadequate response)   PLQ Eye 11/01/2022 normal Nathaneil Bakes   Visual Field scheduled 11/27/2022 f/u TBD  Procedures:  No procedures performed Allergies: Penicillins    Assessment / Plan:     Visit Diagnoses: Rheumatoid arthritis involving multiple sites with positive rheumatoid factor (HCC) - +RF,+ anti-CCP: She has no synovitis on examination today.  She remains on Plaquenil  200 mg 1 tablet by mouth twice daily.  She is tolerating Plaquenil  without any side effects and has not had any recent gaps in therapy.  Last week the patient had a flare involving both hands at which time she was having increased pain and swelling.  A prednisone  taper sent to the pharmacy on 07/14/2023 but she did not end up taking prednisone .  She has been taking THC Gummies and has noticed an improvement in her pain as well as sleep.  Her myofascial pain has been better controlled since starting THC Gummies.  Patient presents today with ongoing pain in the left hip for several months.  She has not had any recent injury or fall.  On examination she has slightly limited range of motion with discomfort in the left groin.  X-rays of the left hip were obtained today for further evaluation.  She will remain on Plaquenil  as prescribed as  monotherapy.  She was advised to notify us  if she develops signs or  symptoms of a flare.  High risk medication use - Plaquenil  200 mg 1 tablet by mouth BID started April 2023. 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 Dx w/ cellulitis 05/05/2022 treated with doxycycline . Hepatic function panel and lipid panel updated 06/18/23.  CBC updated on 04/08/23.  She will be due to update lab work in March 2025 Lipid panel 06/18/23 PLQ Eye 11/01/2022 normal [facility] care.    HLA B27 (HLA B27 positive)  Bicipital tendinitis, left shoulder: Good ROM with no discomfort.    Primary osteoarthritis of right knee: No warmth or effusion noted.   Pain in left hip -Patient has been experiencing intermittent discomfort in the left hip for the past several months.  No recent injury or fall.  She experiences some stiffness and discomfort when rising from a seated position as well as first thing in the mornings.  She has noticed persistent pain especially as she has increased her walking regimen to 2 to 3 miles daily.  Patient states that by about mile 1 she is having increased discomfort in the left groin.  On examination today she has slightly limited range of motion of the left hip with discomfort.  No tenderness over the trochanteric bursa or along the IT band noted.  X-rays of the left hip were ordered today due to the chronicity and severity of discomfort.  Different treatment options were discussed.  She is interested in pursuing a referral with orthopedics to discuss an intra-articular hip injection in the future.  plan: XR HIP UNILAT W OR W/O PELVIS 2-3 VIEWS LEFT  Myofascial pain - She remains on Cymbalta  30 mg twice daily and takes methocarbamol  500 mg 1 tablet as needed at bedtime.  She has started to take [facility] Gummies which has helped with controlling her myofascial pain and helping her sleep better at night.  She does not need a refill of methocarbamol  at this time.  Trapezius muscle spasm -She takes  methocarbamol  500 mg 1 tablet at bedtime as needed to help with muscle spasms and insomnia.  Since starting to take THC Gummies she has not needed to take methocarbamol .  She does not require a refill of methocarbamol  at this time.  Other medical conditions are listed as follows:  Morton's neuroma of right foot  Hypersomnolence  Other fatigue  OSA (obstructive sleep apnea)  Memory loss  History of hyperlipidemia  History of hypertension: Blood pressure was 130/74 today in the office.  History of hypothyroidism  Prediabetes    Orders: Orders Placed This Encounter  Procedures   XR HIP UNILAT W OR W/O PELVIS 2-3 VIEWS LEFT   No orders of the defined types were placed in this encounter.   Follow-Up Instructions: Return in about 5 months (around 12/18/2023) for Rheumatoid arthritis.   Romayne Clubs, PA-C  Note - This record has been created using Dragon software.  Chart creation errors have been sought, but may not always  have been located. Such creation errors do not reflect on  the standard of medical care.

## 2023-07-21 ENCOUNTER — Ambulatory Visit: Payer: 59 | Attending: Physician Assistant | Admitting: Physician Assistant

## 2023-07-21 ENCOUNTER — Encounter: Payer: Self-pay | Admitting: Physician Assistant

## 2023-07-21 ENCOUNTER — Ambulatory Visit (INDEPENDENT_AMBULATORY_CARE_PROVIDER_SITE_OTHER): Payer: 59

## 2023-07-21 VITALS — BP 130/74 | HR 70 | Resp 16 | Ht 67.0 in | Wt 266.2 lb

## 2023-07-21 DIAGNOSIS — R7303 Prediabetes: Secondary | ICD-10-CM

## 2023-07-21 DIAGNOSIS — Z8639 Personal history of other endocrine, nutritional and metabolic disease: Secondary | ICD-10-CM

## 2023-07-21 DIAGNOSIS — Z1589 Genetic susceptibility to other disease: Secondary | ICD-10-CM | POA: Diagnosis not present

## 2023-07-21 DIAGNOSIS — G4733 Obstructive sleep apnea (adult) (pediatric): Secondary | ICD-10-CM

## 2023-07-21 DIAGNOSIS — M62838 Other muscle spasm: Secondary | ICD-10-CM

## 2023-07-21 DIAGNOSIS — M0579 Rheumatoid arthritis with rheumatoid factor of multiple sites without organ or systems involvement: Secondary | ICD-10-CM | POA: Diagnosis not present

## 2023-07-21 DIAGNOSIS — M25552 Pain in left hip: Secondary | ICD-10-CM

## 2023-07-21 DIAGNOSIS — M7522 Bicipital tendinitis, left shoulder: Secondary | ICD-10-CM

## 2023-07-21 DIAGNOSIS — Z79899 Other long term (current) drug therapy: Secondary | ICD-10-CM

## 2023-07-21 DIAGNOSIS — G5761 Lesion of plantar nerve, right lower limb: Secondary | ICD-10-CM

## 2023-07-21 DIAGNOSIS — M1711 Unilateral primary osteoarthritis, right knee: Secondary | ICD-10-CM

## 2023-07-21 DIAGNOSIS — Z8679 Personal history of other diseases of the circulatory system: Secondary | ICD-10-CM

## 2023-07-21 DIAGNOSIS — G4710 Hypersomnia, unspecified: Secondary | ICD-10-CM

## 2023-07-21 DIAGNOSIS — R5383 Other fatigue: Secondary | ICD-10-CM

## 2023-07-21 DIAGNOSIS — R413 Other amnesia: Secondary | ICD-10-CM

## 2023-07-21 DIAGNOSIS — M7918 Myalgia, other site: Secondary | ICD-10-CM

## 2023-07-22 NOTE — Progress Notes (Signed)
Unremarkable x-rays of hip except for acetabular spurring.  Please notify the patient and offer referral to orthopedics to discuss possibly proceeding with a left hip injection as requested.

## 2023-10-06 ENCOUNTER — Other Ambulatory Visit: Payer: Self-pay | Admitting: Physician Assistant

## 2023-10-06 DIAGNOSIS — Z79899 Other long term (current) drug therapy: Secondary | ICD-10-CM

## 2023-10-06 NOTE — Telephone Encounter (Signed)
 Last Fill: 07/14/2023  Eye exam: 11/01/2022 normal    Labs: 04/08/2023 Glucose is 106. Rest of CMP WNL. CBC WNL.   Next Visit: 01/01/2024  Last Visit: 07/21/2023  [id number] arthritis involving multiple sites with positive rheumatoid factor   Current Dose per office note 07/21/2023: Plaquenil  200 mg 1 tablet by mouth BID   Patient advised she is due to update labs. Patient states she will come in today or tomorrow to update.   Okay to refill Plaquenil ?

## 2023-10-08 ENCOUNTER — Other Ambulatory Visit: Payer: Self-pay | Admitting: *Deleted

## 2023-10-08 DIAGNOSIS — Z79899 Other long term (current) drug therapy: Secondary | ICD-10-CM

## 2023-10-09 LAB — COMPREHENSIVE METABOLIC PANEL WITH GFR
AG Ratio: 2 (calc) (ref 1.0–2.5)
ALT: 12 U/L (ref 6–29)
AST: 13 U/L (ref 10–35)
Albumin: 4.2 g/dL (ref 3.6–5.1)
Alkaline phosphatase (APISO): 49 U/L (ref 37–153)
BUN: 13 mg/dL (ref 7–25)
CO2: 31 mmol/L (ref 20–32)
Calcium: 9.8 mg/dL (ref 8.6–10.4)
Chloride: 102 mmol/L (ref 98–110)
Creat: 0.94 mg/dL (ref 0.50–1.03)
Globulin: 2.1 g/dL (ref 1.9–3.7)
Glucose, Bld: 112 mg/dL — ABNORMAL HIGH (ref 65–99)
Potassium: 4.4 mmol/L (ref 3.5–5.3)
Sodium: 140 mmol/L (ref 135–146)
Total Bilirubin: 0.6 mg/dL (ref 0.2–1.2)
Total Protein: 6.3 g/dL (ref 6.1–8.1)
eGFR: 70 mL/min/{1.73_m2} (ref >=60)

## 2023-10-09 LAB — CBC WITH DIFFERENTIAL/PLATELET
Absolute Lymphocytes: 2458 {cells}/uL (ref 850–3900)
Absolute Monocytes: 381 {cells}/uL (ref 200–950)
Basophils Absolute: 11 {cells}/uL (ref 0–200)
Basophils Relative: 0.2 %
Eosinophils Absolute: 118 {cells}/uL (ref 15–500)
Eosinophils Relative: 2.1 %
HCT: 43.1 % (ref 35.0–45.0)
Hemoglobin: 14 g/dL (ref 11.7–15.5)
MCH: 30 pg (ref 27.0–33.0)
MCHC: 32.5 g/dL (ref 32.0–36.0)
MCV: 92.3 fL (ref 80.0–100.0)
MPV: 10.4 fL (ref 7.5–12.5)
Monocytes Relative: 6.8 %
Neutro Abs: 2632 {cells}/uL (ref 1500–7800)
Neutrophils Relative %: 47 %
Platelets: 295 10*3/uL (ref 140–400)
RBC: 4.67 10*6/uL (ref 3.80–5.10)
RDW: 12.4 % (ref 11.0–15.0)
Total Lymphocyte: 43.9 %
WBC: 5.6 10*3/uL (ref 3.8–10.8)

## 2023-10-09 NOTE — Progress Notes (Signed)
CBC and CMP are normal.  Glucose is mildly elevated, probably not a fasting sample.

## 2023-11-06 ENCOUNTER — Other Ambulatory Visit: Payer: Self-pay | Admitting: *Deleted

## 2023-11-06 MED ORDER — METHOCARBAMOL 500 MG PO TABS: 500.0000 mg | ORAL_TABLET | Freq: Every day | ORAL | 0 refills | Status: AC | PRN

## 2023-11-06 MED ORDER — PREDNISONE 5 MG PO TABS: ORAL_TABLET | ORAL | 0 refills | Status: DC

## 2023-11-06 NOTE — Addendum Note (Signed)
 Addended by: Adrianne Horn on: 11/06/2023 01:04 PM   Modules accepted: Orders

## 2023-11-06 NOTE — Telephone Encounter (Signed)
 Patient contacted the office stating she is having a flare. Patient states her flare started on 10/30/2023. Patient states she has tried waiting it out but it is not improving. Patient states she is having trouble with pain in her right foot and ankle, right hip, right shoulder and right wrist. Patient states she is also having pain and swelling in her hands and knuckles. Patient states she is taking her PLQ as prescribed. Patient is requesting a prescription for Prednisone  and would also like a refill on her Methocarbamol . Please advise. Patient uses the [facility].

## 2023-11-06 NOTE — Telephone Encounter (Signed)
 Ok to refill robaxin . Ok to send in prednisone  starting at 20 mg tapering by 5 mg every 4 days.  Take prednisone  in the morning with food and avoid the use of NSAIDs.

## 2023-11-06 NOTE — Telephone Encounter (Signed)
 Patient advised we will be sending in the Robaxin  and the prednisone  starting at 20 mg tapering by 5 mg every 4 days.  Take prednisone  in the morning with food and avoid the use of NSAIDs.

## 2023-12-08 ENCOUNTER — Ambulatory Visit: Admitting: Family Medicine

## 2023-12-08 ENCOUNTER — Other Ambulatory Visit: Payer: Self-pay | Admitting: Rheumatology

## 2023-12-08 ENCOUNTER — Encounter: Payer: Self-pay | Admitting: Family Medicine

## 2023-12-08 VITALS — BP 140/84 | HR 76 | Temp 99.9°F | Wt 256.4 lb

## 2023-12-08 DIAGNOSIS — M0579 Rheumatoid arthritis with rheumatoid factor of multiple sites without organ or systems involvement: Secondary | ICD-10-CM

## 2023-12-08 DIAGNOSIS — L0231 Cutaneous abscess of buttock: Secondary | ICD-10-CM | POA: Diagnosis not present

## 2023-12-08 DIAGNOSIS — I10 Essential (primary) hypertension: Secondary | ICD-10-CM | POA: Diagnosis not present

## 2023-12-08 DIAGNOSIS — L03317 Cellulitis of buttock: Secondary | ICD-10-CM

## 2023-12-08 MED ORDER — DOXYCYCLINE HYCLATE 100 MG PO TABS: 100.0000 mg | ORAL_TABLET | Freq: Two times a day (BID) | ORAL | 0 refills | Status: AC

## 2023-12-08 NOTE — Telephone Encounter (Signed)
 Patient left a voicemail requesting a prescription refill:   1. Name of Medication: Prednisone   2. How are you currently taking this medication (dosage and times per day)? Patient did not leave that information on voicemail   3. What pharmacy would you like for that to be sent to? [facility]  Patient states she will be travelling next week and wants to take the prescription with her.  Patient states she doesn't want to be 10 hours away and run out of pills.

## 2023-12-08 NOTE — Telephone Encounter (Signed)
 Attempted to contact patient again an [facility] for patient to call the office.

## 2023-12-08 NOTE — Addendum Note (Signed)
 Addended by: Tomi Grandpre M on: 12/08/2023 05:20 PM   Modules accepted: Orders

## 2023-12-08 NOTE — Telephone Encounter (Signed)
 Last Fill: 11/06/2023   Next Visit: 01/01/2024  Last Visit: 07/21/2023  Dx: Rheumatoid arthritis involving multiple sites with positive rheumatoid factor   Current Dose per office note on 07/21/2023: Not discussed.  Okay to refill Prednisone ?

## 2023-12-08 NOTE — Telephone Encounter (Signed)
 Attempted to contact the patient and left message for patient to call the office.

## 2023-12-08 NOTE — Progress Notes (Signed)
 Chief Complaint  Patient presents with   Acute Visit    Abscess on buttocks. Started with a mosquito bite. Last Wednesday started itching again. By Thursday it was a little pimple. Been putting antibioitic cream and band aids. Did cold and warm compresses.    Patient presents with complaint of abscess on buttocks.  2 weeks ago she got bitten by a mosquito (walking dog in her underwear). It started itching again 6/25.  Wife said it looked like a bug bite on 6/26, treated with mupirocin .  Has been using that once a day, but has been growing.  It was the size of a quarter on Saturday, up to 1/2 dollar size yesterday.  She has been applying warm compresses. Denies any drainage.   PMH, PSH, SH reviewed  Outpatient Encounter Medications as of 12/08/2023  Medication Sig Note   acetaminophen (TYLENOL) 500 MG tablet Take 500 mg by mouth every 6 (six) hours as needed.    B Complex Vitamins (B COMPLEX PO) Take by mouth daily.    benazepril  (LOTENSIN ) 10 MG tablet Take 1 tablet (10 mg total) by mouth daily.    DULoxetine  (CYMBALTA ) 30 MG capsule Take 1 capsule (30 mg total) by mouth 2 (two) times daily.    fish oil-omega-3 fatty acids 1000 MG capsule Take 1 g by mouth 2 (two) times daily.    hydrochlorothiazide  (HYDRODIURIL ) 25 MG tablet Take 1 tablet (25 mg total) by mouth daily.    hydroxychloroquine  (PLAQUENIL ) 200 MG tablet TAKE 1 TABLET(200 MG) BY MOUTH TWICE DAILY    loratadine (CLARITIN) 10 MG tablet Take 10 mg by mouth as needed for allergies. 09/12/2022: As needed   methocarbamol  (ROBAXIN ) 500 MG tablet Take 1 tablet (500 mg total) by mouth daily as needed for muscle spasms.    Multiple Vitamins-Minerals (MULTIVITAMIN ADULT PO) Take by mouth daily.    mupirocin  ointment (BACTROBAN ) 2 % Apply to affected areas of skin twice daily as needed for infection.    OVER THE COUNTER MEDICATION THC gummy- half of 2.5mg  at bedtime.    SUMAtriptan  (IMITREX ) 50 MG tablet Take 50-100 mg at onset of migraine.  May repeat in 2 hours if headache persists or recurs. (Max 200 mg/24 hours)    SYNTHROID  150 MCG tablet Take 1 tablet (150 mcg total) by mouth daily before breakfast.    predniSONE  (DELTASONE ) 5 MG tablet Take 4 tabs po x 4 days, 3  tabs po x 4 days, 2  tabs po x 4 days, 1  tab po x 4 days (Patient not taking: Reported on 12/08/2023)    predniSONE  (DELTASONE ) 5 MG tablet Take 4 tabs po x 4 days, 3  tabs po x 4 days, 2  tabs po x 4 days, 1  tab po x 4 days. Take in the morning with breakfast. Do not take any NSAIDS. (Patient not taking: Reported on 12/08/2023)    predniSONE  (DELTASONE ) 5 MG tablet Take 4 tabs po x 4 days, 3  tabs po x 4 days, 2  tabs po x 4 days, 1  tab po x 4 days. Take in the morning with breakfast. Do not take any NSAIDS. (Patient not taking: Reported on 12/08/2023)    rosuvastatin  (CRESTOR ) 5 MG tablet Take 1 tablet (5 mg total) by mouth daily. (Patient not taking: Reported on 12/08/2023)    No facility-administered encounter medications on file as of 12/08/2023.   Allergies  Allergen Reactions   Penicillins Rash    ROS: no f/c. No  URI symptoms, No n/v/d. +fatigue (related to the weather). Joints flared not long ago, treated with prednisone .  R shoulder/elbow pain sometimes. Moods are good No CP, SOB     PHYSICAL EXAM:  BP (!) 140/84   Pulse 76   Temp 99.9 F (37.7 C)   Wt 256 lb 6.4 oz (116.3 kg)   LMP 01/13/2013   SpO2 94%   BMI 40.16 kg/m   Wt Readings from Last 3 Encounters:  12/08/23 256 lb 6.4 oz (116.3 kg)  07/21/23 266 lb 3.2 oz (120.7 kg)  04/09/23 262 lb 6.4 oz (119 kg)    Pleasant female, in no acute distress.  L buttock cheek-- Central area of redness is 1.5 x 1 cm, with 2mm central scab.   3.5 cm of surrounding erythema (lighter pink), with some even lighter pink spreading beyond (6-7 cm) The area is indurated, no area of fluctuance Tender to palpation.  Psych: normal mood, affect, hygiene and grooming Neuro: alert and oriented, cranial  nerves grossly intact, normal gait   ASSESSMENT/PLAN:  Abscess and cellulitis of gluteal region - indurated, no fluctuance, not ready for I&D. Rec warm compresses, doxy.  f/u if not draining in the next couple of days for I&D - Plan: doxycycline  (VIBRA -TABS) 100 MG tablet  Essential hypertension, benign - BP mildly elevated today, but in pain. Cont current meds  Rheumatoid arthritis involving multiple sites with positive rheumatoid factor (HCC) - recent use of prednisone  for flare. Not currently on steroids. Remains on plaquenil   Area of erythema marked with pen, to ensure improving, rather than continuing to spread. Wife to help monitor. Warm compresses and f/u in 1-2d if increasing in size and not draining.  Plan was to move to [place]  in the next couple of weeks.  Will need this area to be healed in order to tolerate the long drive.    Unfortunately, the abscess is not yet ready to be incised and drained. Continue to apply warm compresses. Take the doxycycline  twice daily.  Be sure to use sunscreen when in the sun to prevent burns/rash. Return for incision if not draining on its own, and getting larger.

## 2023-12-08 NOTE — Patient Instructions (Signed)
  Unfortunately, the abscess is not yet ready to be incised and drained. Continue to apply warm compresses. Take the doxycycline  twice daily.  Be sure to use sunscreen when in the sun to prevent burns/rash. Return for incision if not draining on its own, and getting larger.

## 2023-12-09 MED ORDER — PREDNISONE 5 MG PO TABS: ORAL_TABLET | ORAL | 0 refills | Status: DC

## 2023-12-11 ENCOUNTER — Ambulatory Visit: Admitting: Family Medicine

## 2023-12-18 NOTE — Progress Notes (Signed)
 Office Visit Note  [name]             Date of Birth: 1966/01/17           MRN: [id number]             PCP: Randol Dawes, MD Referring: Randol Dawes, MD Visit Date: 01/01/2024 Occupation: @GUAROCC @  Subjective:  Pain in joints  History of Present Illness: Jill Mathews is a 58 y.o. female with seropositive rheumatoid arthritis and myofascial pain syndrome.  She has been having pain and discomfort in her right shoulder, right elbow and dorsum of her left foot for the last 3 to 4 months.  Her right wrist joint pain is better.  The left hip pain resolved after changing her boots.  She continues to have some flares of myofascial pain.  She continues to take Cymbalta  30 mg twice a day and methocarbamol  500 mg as needed as needed.  She gets relief from [facility].  She is on Plaquenil  200 mg p.o. twice daily since April 2023.  An abscess on her buttock which resolved after treated with doxycycline .  She developed a ringworm infection on her left wrist.  She has been using over-the-counter clotrimazole.    Activities of Daily Living:  Patient reports morning stiffness for 10 minutes.   Patient Denies nocturnal pain.  Difficulty dressing/grooming: Denies Difficulty climbing stairs: Denies Difficulty getting out of chair: Denies Difficulty using hands for taps, buttons, cutlery, and/or writing: Denies  Review of Systems  Constitutional:  Positive for fatigue.  HENT:  Positive for mouth sores. Negative for mouth dryness.   Eyes:  Positive for dryness.  Respiratory:  Negative for shortness of breath and difficulty breathing.   Cardiovascular:  Negative for chest pain and palpitations.  Gastrointestinal:  Positive for constipation and diarrhea. Negative for blood in stool.  Endocrine: Negative for increased urination.  Genitourinary:  Negative for involuntary urination.  Musculoskeletal:  Positive for joint pain, joint pain, myalgias, muscle weakness, morning stiffness,  muscle tenderness and myalgias. Negative for gait problem and joint swelling.  Skin:  Positive for rash. Negative for color change, hair loss and sensitivity to sunlight.  Allergic/Immunologic: Positive for susceptible to infections.  Neurological:  Positive for headaches. Negative for dizziness.  Hematological:  Negative for swollen glands.  Psychiatric/Behavioral:  Negative for depressed mood and sleep disturbance. The patient is nervous/anxious.     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 Dolphus manages   Sleep apnea    per patient, dx by neuro    Family History  Problem Relation Age of Onset   Heart disease Mother 80       MI, stents x 2; CABG age 37   Stroke Mother 32   Hyperlipidemia Mother    Hypertension Mother    Depression Mother    Diabetes Mother    Thyroid disease Mother        Graves disease,  s/p RAI, now hypothyroid   Irritable bowel syndrome Mother    COPD Mother    Colon polyps Mother    Cancer Mother        skin (not melanoma); lung cancer 2021   Kidney Stones Mother    Lung cancer Mother    Stroke Father 64   Hyperlipidemia Father    Hypertension Father    Depression Father    Diabetes Father    Thyroid disease Father        hypothyroidism   COPD Father    Heart disease Father 45       MI; s/p CABG; pacemaker   Cancer Sister 5       pancreatic cancer   Bipolar disorder Sister    Cancer Maternal Grandfather        laryngeal, smoker   Heart disease Maternal Uncle        s/p CABG, late 60's   Lupus Paternal Aunt    Multiple sclerosis Cousin    Past Surgical History:  Procedure Laterality Date   EXCISION MORTON'S NEUROMA  05/2017   right foot    PILONIDAL CYST EXCISION  77   TONSILLECTOMY  age 83   WISDOM TOOTH EXTRACTION     Social History   Social History Narrative   Lives with female partner Jill Mathews) of 39 years--got married 06/11/13. 3 cats, 1 dog   2 cats, 2 dogs.   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      Updated 01/2022   Immunization History  Administered Date(s) Administered   Fluad Trivalent(High Dose 65+) 04/09/2023   Influenza Inj Mdck Quad Pf 04/11/2019   Influenza Split 03/10/2012   Influenza,inj,Quad PF,6+ Mos 04/11/2019, 03/01/2021, 04/24/2022   Influenza-Unspecified 03/10/2020   PFIZER Comirnaty(Gray Top)Covid-19 Tri-Sucrose Vaccine 12/27/2020   PFIZER(Purple Top)SARS-COV-2 Vaccination 08/27/2019, 09/10/2019, 01/09/2020   Pfizer Covid-19 Vaccine Bivalent Booster 73yrs & up 03/01/2021   Pfizer(Comirnaty)Fall Seasonal Vaccine 12 years and older 04/24/2022, 04/09/2023   Pneumococcal Conjugate-13 11/01/2015   Pneumococcal Polysaccharide-23 07/02/2006, 12/30/2018   Tdap 09/09/2012, 01/16/2022     Objective: Vital Signs: BP 123/84 (BP Location: Right Arm, Patient Position: Sitting, Cuff Size: Large)   Pulse 65   Resp 14    Ht 5' 7 (1.702 m)   Wt 263 lb (119.3 kg)   LMP 01/13/2013   BMI 41.19 kg/m    Physical Exam Vitals and nursing note reviewed.  Constitutional:      Appearance: She is well-developed.  HENT:     Head: Normocephalic and atraumatic.  Eyes:     Conjunctiva/sclera: Conjunctivae normal.  Cardiovascular:     Rate and Rhythm: Normal rate and regular rhythm.     Heart sounds: Normal heart sounds.  Pulmonary:     Effort: Pulmonary effort is normal.     Breath sounds: Normal breath sounds.  Abdominal:     General: Bowel sounds are normal.     Palpations: Abdomen is soft.  Musculoskeletal:     Cervical back: Normal  range of motion.  Lymphadenopathy:     Cervical: No cervical adenopathy.  Skin:    General: Skin is warm and dry.     Capillary Refill: Capillary refill takes less than 2 seconds.  Neurological:     Mental Status: She is alert and oriented to person, place, and time.  Psychiatric:        Behavior: Behavior normal.      Musculoskeletal Exam: Cervical, thoracic and lumbar spine good range of motion.  Shoulders, elbows, wrist joints, MCPs PIPs and DIPs were in full range of motion.  She has some discomfort with range of motion of her right shoulder joint to the anterior aspect.  She tenderness over right elbow joint without any synovitis.  Hip joints and knee joints in good range of motion without any discomfort.  She had some discomfort over the dorsum of her left foot.  Dorsal spurs were noted over bilateral feet.  CDAI Exam: CDAI Score: 6  Patient Global: 20 / 100; Provider Global: 20 / 100 Swollen: 0 ; Tender: 3  Joint Exam 01/01/2024      Right  Left  Glenohumeral   Tender     Elbow   Tender     Tarsometatarsal      Tender     Investigation: No additional findings.  Imaging: No results found.  Recent Labs: Lab Results  Component Value Date   WBC 5.6 10/08/2023   HGB 14.0 10/08/2023   PLT 295 10/08/2023   NA 140 10/08/2023   K 4.4 10/08/2023   CL  102 10/08/2023   CO2 31 10/08/2023   GLUCOSE 112 (H) 10/08/2023   BUN 13 10/08/2023   CREATININE 0.94 10/08/2023   BILITOT 0.6 10/08/2023   ALKPHOS 68 06/18/2023   AST 13 10/08/2023   ALT 12 10/08/2023   PROT 6.3 10/08/2023   ALBUMIN 4.0 06/18/2023   CALCIUM  9.8 10/08/2023   GFRAA 85 12/12/2020   QFTBGOLD NEGATIVE 05/05/2017   QFTBGOLDPLUS NEGATIVE 07/12/2021    Speciality Comments: Prior therapy includes: Enbrel  (inadequate response), Plaquenil  (inadequate response)   PLQ Eye 11/01/2022 normal [name]   Visual Field scheduled 11/27/2022 f/u TBD  Procedures:  No procedures performed Allergies: Penicillins   Assessment / Plan:     Visit Diagnoses: Rheumatoid arthritis involving multiple sites with positive rheumatoid factor (HCC) - Positive RF, positive anti-CCP, inflammatory arthritis: She has been doing well on hydroxychloroquine  200 mg p.o. twice daily.  No synovitis was noted on the examination.  She has been having some discomfort in the right shoulder, right elbow and the dorsum of her left foot.  No synovitis was noted.  Patient is moving to [place]  next month.  Patient plans to come here for the follow-up visit.  I advised her to sign for release of medical records in case she switches to a rheumatologist in [place] .  HLA B27 (HLA B27 positive)  High risk medication use - Plaquenil  200 mg p.o. 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  She had mild tenderness on palpation.  A handout on shoulder joint exercises was given.  Use of topical Voltaren gel was discussed.  I also advised her to contact us  if her symptoms persist.  We can consider cortisone injection.  Pain in  right elbow-she complains of some  discomfort in her right elbow.  No synovitis or tenderness was noted on the examination today.  Primary osteoarthritis of right knee-she has intermittent discomfort.  Pain in left foot-she complains of discomfort over the dorsum of her left foot.  Dorsal spurs were noted bilaterally.  Proper fitting shoes with arch support were discussed.  Some of the exercises were demonstrated.  I also discussed the option of intertarsal injection in the future if needed.  Patient will contact us  if her symptoms get worse.  Tinea corporis-a lesion of tinea corporis was noted on her left wrist.  She has been using topical clotrimazole which has been helpful.  Myofascial pain-she has been having frequent flares with the weather change and rain.  She states she does quite well on Cymbalta .  She has reduced the dose of Cymbalta  to 30 mg p.o. daily.  She also uses methocarbamol  on a as needed basis.  Other fatigue-related to fibromyalgia  Other medical problems listed as follows:  Hypersomnolence  History of hyperlipidemia-lipid panel on January 2025 triglycerides and LDL were elevated.  History of hypertension-blood pressure was normal today at 123/84.  History of hypothyroidism  Prediabetes-hemoglobin A1c was 6.4 in January 2025.    OSA (obstructive sleep apnea)  Orders: Orders Placed This Encounter  Procedures   CBC with Differential/Platelet   Comprehensive metabolic panel with GFR   No orders of the defined types were placed in this encounter.    Follow-Up Instructions: Return in about 5 months (around 06/02/2024) for Rheumatoid arthritis, Osteoarthritis.  Patient is moving to [place] .  She plans to return in 5 months.   Maya Nash, MD  Note - This record has been created using Animal nutritionist.  Chart creation errors have been sought, but may not always  have been located. Such creation errors do not reflect on  the standard of medical care.

## 2024-01-01 ENCOUNTER — Encounter: Payer: Self-pay | Admitting: Rheumatology

## 2024-01-01 ENCOUNTER — Ambulatory Visit: Payer: 59 | Attending: Rheumatology | Admitting: Rheumatology

## 2024-01-01 VITALS — BP 123/84 | HR 65 | Resp 14 | Ht 67.0 in | Wt 263.0 lb

## 2024-01-01 DIAGNOSIS — M25552 Pain in left hip: Secondary | ICD-10-CM

## 2024-01-01 DIAGNOSIS — G5761 Lesion of plantar nerve, right lower limb: Secondary | ICD-10-CM

## 2024-01-01 DIAGNOSIS — M25511 Pain in right shoulder: Secondary | ICD-10-CM | POA: Diagnosis not present

## 2024-01-01 DIAGNOSIS — Z1589 Genetic susceptibility to other disease: Secondary | ICD-10-CM | POA: Diagnosis not present

## 2024-01-01 DIAGNOSIS — G4733 Obstructive sleep apnea (adult) (pediatric): Secondary | ICD-10-CM

## 2024-01-01 DIAGNOSIS — R7303 Prediabetes: Secondary | ICD-10-CM

## 2024-01-01 DIAGNOSIS — M7918 Myalgia, other site: Secondary | ICD-10-CM

## 2024-01-01 DIAGNOSIS — Z8679 Personal history of other diseases of the circulatory system: Secondary | ICD-10-CM

## 2024-01-01 DIAGNOSIS — R5383 Other fatigue: Secondary | ICD-10-CM

## 2024-01-01 DIAGNOSIS — Z79899 Other long term (current) drug therapy: Secondary | ICD-10-CM

## 2024-01-01 DIAGNOSIS — G8929 Other chronic pain: Secondary | ICD-10-CM

## 2024-01-01 DIAGNOSIS — Z8639 Personal history of other endocrine, nutritional and metabolic disease: Secondary | ICD-10-CM

## 2024-01-01 DIAGNOSIS — M0579 Rheumatoid arthritis with rheumatoid factor of multiple sites without organ or systems involvement: Secondary | ICD-10-CM | POA: Diagnosis not present

## 2024-01-01 DIAGNOSIS — R413 Other amnesia: Secondary | ICD-10-CM

## 2024-01-01 DIAGNOSIS — B354 Tinea corporis: Secondary | ICD-10-CM

## 2024-01-01 DIAGNOSIS — M1711 Unilateral primary osteoarthritis, right knee: Secondary | ICD-10-CM

## 2024-01-01 DIAGNOSIS — M79672 Pain in left foot: Secondary | ICD-10-CM

## 2024-01-01 DIAGNOSIS — M7522 Bicipital tendinitis, left shoulder: Secondary | ICD-10-CM

## 2024-01-01 DIAGNOSIS — M62838 Other muscle spasm: Secondary | ICD-10-CM

## 2024-01-01 DIAGNOSIS — G4710 Hypersomnia, unspecified: Secondary | ICD-10-CM

## 2024-01-01 DIAGNOSIS — M25521 Pain in right elbow: Secondary | ICD-10-CM

## 2024-01-01 LAB — CBC WITH DIFFERENTIAL/PLATELET
Absolute Lymphocytes: 2094 {cells}/uL (ref 850–3900)
Absolute Monocytes: 318 {cells}/uL (ref 200–950)
Basophils Absolute: 21 {cells}/uL (ref 0–200)
Basophils Relative: 0.4 %
Eosinophils Absolute: 90 {cells}/uL (ref 15–500)
Eosinophils Relative: 1.7 %
HCT: 42.7 % (ref 35.0–45.0)
Hemoglobin: 13.8 g/dL (ref 11.7–15.5)
MCH: 29.9 pg (ref 27.0–33.0)
MCHC: 32.3 g/dL (ref 32.0–36.0)
MCV: 92.4 fL (ref 80.0–100.0)
MPV: 10.5 fL (ref 7.5–12.5)
Monocytes Relative: 6 %
Neutro Abs: 2777 {cells}/uL (ref 1500–7800)
Neutrophils Relative %: 52.4 %
Platelets: 269 Thousand/uL (ref 140–400)
RBC: 4.62 Million/uL (ref 3.80–5.10)
RDW: 12.9 % (ref 11.0–15.0)
Total Lymphocyte: 39.5 %
WBC: 5.3 Thousand/uL (ref 3.8–10.8)

## 2024-01-01 LAB — COMPREHENSIVE METABOLIC PANEL WITH GFR
AG Ratio: 2.2 (calc) (ref 1.0–2.5)
ALT: 21 U/L (ref 6–29)
AST: 20 U/L (ref 10–35)
Albumin: 4.2 g/dL (ref 3.6–5.1)
Alkaline phosphatase (APISO): 54 U/L (ref 37–153)
BUN: 18 mg/dL (ref 7–25)
CO2: 31 mmol/L (ref 20–32)
Calcium: 9.6 mg/dL (ref 8.6–10.4)
Chloride: 103 mmol/L (ref 98–110)
Creat: 0.8 mg/dL (ref 0.50–1.03)
Globulin: 1.9 g/dL (ref 1.9–3.7)
Glucose, Bld: 158 mg/dL — ABNORMAL HIGH (ref 65–99)
Potassium: 4.4 mmol/L (ref 3.5–5.3)
Sodium: 142 mmol/L (ref 135–146)
Total Bilirubin: 0.4 mg/dL (ref 0.2–1.2)
Total Protein: 6.1 g/dL (ref 6.1–8.1)
eGFR: 85 mL/min/1.73m2 (ref >=60)

## 2024-01-01 NOTE — Patient Instructions (Addendum)

## 2024-01-02 ENCOUNTER — Ambulatory Visit: Payer: Self-pay | Admitting: Rheumatology

## 2024-01-02 NOTE — Progress Notes (Signed)
 CBC and CMP normal except glucose is elevated at 158.  Please notify patient and forward results to PCP.

## 2024-01-06 ENCOUNTER — Other Ambulatory Visit: Payer: Self-pay | Admitting: Family Medicine

## 2024-01-06 DIAGNOSIS — E039 Hypothyroidism, unspecified: Secondary | ICD-10-CM

## 2024-01-06 MED ORDER — SYNTHROID 150 MCG PO TABS: 150.0000 ug | ORAL_TABLET | Freq: Every day | ORAL | 0 refills | Status: DC

## 2024-01-06 NOTE — Telephone Encounter (Signed)
 Copied from CRM [id number]. Topic: Clinical - Medication Refill >> Jan 06, 2024 11:27 AM Graeme ORN wrote: Medication: SYNTHROID  150 MCG tablet - Moving 8/1 needs before then   Has the patient contacted their pharmacy? Yes (Agent: If no, request that the patient contact the pharmacy for the refill. If patient does not wish to contact the pharmacy document the reason why and proceed with request.) (Agent: If yes, when and what did the pharmacy advise?) expired, too old need new RX   This is the patient's preferred pharmacy:  [id number] [place], [place] - [address] AT [facility] [address] [place] [address] Phone: [phone] Fax: [phone]  Is this the correct pharmacy for this prescription? Yes If no, delete pharmacy and type the correct one.   Has the prescription been filled recently? No  Is the patient out of the medication? No - Moving 8/1 needs before then   Has the patient been seen for an appointment in the last year OR does the patient have an upcoming appointment? Yes  Can we respond through MyChart? No  Agent: Please be advised that Rx refills may take up to 3 business days. We ask that you follow-up with your pharmacy.

## 2024-04-03 ENCOUNTER — Other Ambulatory Visit: Payer: Self-pay | Admitting: Family Medicine

## 2024-04-03 DIAGNOSIS — I10 Essential (primary) hypertension: Secondary | ICD-10-CM

## 2024-04-05 ENCOUNTER — Other Ambulatory Visit: Payer: Self-pay | Admitting: *Deleted

## 2024-04-05 DIAGNOSIS — E039 Hypothyroidism, unspecified: Secondary | ICD-10-CM

## 2024-04-05 DIAGNOSIS — I10 Essential (primary) hypertension: Secondary | ICD-10-CM

## 2024-04-05 MED ORDER — BENAZEPRIL HCL 10 MG PO TABS: 10.0000 mg | ORAL_TABLET | Freq: Every day | ORAL | 0 refills | Status: DC

## 2024-04-05 MED ORDER — SYNTHROID 150 MCG PO TABS: 150.0000 ug | ORAL_TABLET | Freq: Every day | ORAL | 0 refills | Status: DC

## 2024-04-05 NOTE — Telephone Encounter (Signed)
 Is this okay to send one more 90 day rx on each? Patient has not been able to find PCP yet but will be sure she finds one in the next 90 days. She wanted to let you know she feels great and lost some more weight! Misses everyone here.

## 2024-05-05 NOTE — Progress Notes (Deleted)
 Office Visit Note  [name]             Date of Birth: 08-03-1965           MRN: [id number]             PCP: Randol Dawes, MD Referring: Randol Dawes, MD Visit Date: 05/19/2024 Occupation: Data Unavailable  Subjective:    History of Present Illness: Jill Mathews is a 58 y.o. female with history of rheumatoid arthritis and fibromyalgia.  Patient remains on    CBC and CMP updated on 01/01/24.  PLQ Eye 11/01/2022 normal Jill Mathews    Activities of Daily Living:  Patient reports morning stiffness for *** {minute/hour:19697}.   Patient {ACTIONS;DENIES/REPORTS:21021675::Denies} nocturnal pain.  Difficulty dressing/grooming: {ACTIONS;DENIES/REPORTS:21021675::Denies} Difficulty climbing stairs: {ACTIONS;DENIES/REPORTS:21021675::Denies} Difficulty getting out of chair: {ACTIONS;DENIES/REPORTS:21021675::Denies} Difficulty using hands for taps, buttons, cutlery, and/or writing: {ACTIONS;DENIES/REPORTS:21021675::Denies}  No Rheumatology ROS completed.   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 11/24/2020   Class 2 obesity due to excess calories without serious comorbidity with body mass index (BMI) of 36.0 to 36.9 in adult 11/24/2020   Excessive daytime sleepiness 11/24/2020   Inflammatory autoimmune disorder 11/24/2020   Fibromyalgia 01/07/2020   HLA B27 (HLA B27 positive) 10/24/2016   Prediabetes 10/31/2014   Mixed hyperlipidemia 09/09/2012   Hypothyroidism 05/25/2012   Essential hypertension, benign 05/25/2012   Rheumatoid arthritis involving multiple sites with positive rheumatoid factor (HCC) 05/25/2012   Obesity (BMI 30-39.9) 05/25/2012    Past Medical History:  Diagnosis Date   Fibromyalgia  03/2019   put on cymbalta , and pain resolved   Hyperlipidemia    borderline; dietary management   Hypertension    Hypothyroidism 2001   MRSA infection 04/2022   Rheumatoid arthritis(714.0) 04/2006   Dr. Dolphus manages   Sleep apnea    per patient, dx by neuro    Family History  Problem Relation Age of Onset   Heart disease Mother 36       MI, stents x 2; CABG age 32   Stroke Mother 42   Hyperlipidemia Mother    Hypertension Mother    Depression Mother    Diabetes Mother    Thyroid disease Mother        Graves disease, s/p RAI, now hypothyroid   Irritable bowel syndrome Mother    COPD Mother    Colon polyps Mother    Cancer Mother        skin (not melanoma); lung cancer 2021   Kidney Stones Mother    Lung cancer Mother    Stroke Father 50   Hyperlipidemia Father    Hypertension Father    Depression Father    Diabetes Father    Thyroid disease Father        hypothyroidism   COPD Father    Heart disease Father 33       MI; s/p CABG; pacemaker   Cancer Sister 47       pancreatic cancer   Bipolar disorder Sister    Cancer Maternal Grandfather        laryngeal, smoker   Heart disease Maternal Uncle        s/p CABG, late 60's   Lupus Paternal Aunt    Multiple sclerosis  Cousin    Past Surgical History:  Procedure Laterality Date   EXCISION MORTON'S NEUROMA  05/2017   right foot    PILONIDAL CYST EXCISION  37   TONSILLECTOMY  age 35   WISDOM TOOTH EXTRACTION     Social History   Tobacco Use   Smoking status: Former    Current packs/day: 0.00    Average packs/day: 1.5 packs/day for 26.0 years (39.1 ttl pk-yrs)    Types: Cigarettes    Start date: 01/08/2005    Quit date: 05/2005    Years since quitting: 19.0    Passive exposure: Current   Smokeless tobacco: Former    Types: Chew   Tobacco comments:    only when playing softball (2 years), when they couldn't smoke (age [age])  Vaping Use   Vaping status: Never Used  Substance Use Topics   Alcohol use:  Yes    Comment: rarely   Drug use: Never   Social History   Social History Narrative   Lives with female partner Teddi) of 88 years--got married 06/11/13. 3 cats, 1 dog   2 cats, 2 dogs.   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      Updated 01/2022     Immunization History  Administered Date(s) Administered   Fluad Trivalent(High Dose 65+) 04/09/2023   Influenza Inj Mdck Quad Pf 04/11/2019   Influenza Split 03/10/2012   Influenza,inj,Quad PF,6+ Mos 04/11/2019, 03/01/2021, 04/24/2022   Influenza-Unspecified 03/10/2020   PFIZER Comirnaty(Gray Top)Covid-19 Tri-Sucrose Vaccine 12/27/2020   PFIZER(Purple Top)SARS-COV-2 Vaccination 08/27/2019, 09/10/2019, 01/09/2020   Pfizer Covid-19 Vaccine Bivalent Booster 57yrs & up 03/01/2021   Pfizer(Comirnaty)Fall Seasonal Vaccine 12 years and older 04/24/2022, 04/09/2023   Pneumococcal Conjugate-13 11/01/2015   Pneumococcal Polysaccharide-23 07/02/2006, 12/30/2018   Tdap 09/09/2012, 01/16/2022     Objective: Vital Signs: LMP 01/13/2013    Physical Exam Vitals and nursing note reviewed.  Constitutional:      Appearance: She is well-developed.  HENT:     Head: Normocephalic and atraumatic.  Eyes:     Conjunctiva/sclera: Conjunctivae normal.  Cardiovascular:     Rate and Rhythm: Normal rate and regular rhythm.     Heart sounds: Normal heart sounds.  Pulmonary:     Effort: Pulmonary effort is normal.     Breath sounds: Normal breath sounds.  Abdominal:     General: Bowel sounds are normal.     Palpations: Abdomen is soft.  Musculoskeletal:     Cervical back: Normal range of motion.  Lymphadenopathy:     Cervical: No cervical adenopathy.  Skin:    General: Skin is warm and dry.     Capillary Refill: Capillary refill takes less than 2 seconds.  Neurological:     Mental Status: She is alert and oriented to person, place, and time.  Psychiatric:        Behavior: Behavior normal.      Musculoskeletal Exam: ***  CDAI  Exam: CDAI Score: -- Patient Global: --; Provider Global: -- Swollen: --; Tender: -- Joint Exam 05/19/2024   No joint exam has been documented for this visit   There is currently no information documented on the homunculus. Go to the Rheumatology activity and complete the homunculus joint exam.  Investigation: No additional findings.  Imaging: No results found.  Recent Labs: Lab Results  Component Value Date   WBC 5.3 01/01/2024   HGB 13.8 01/01/2024   PLT 269 01/01/2024   NA 142 01/01/2024   K 4.4 01/01/2024   CL  103 01/01/2024   CO2 31 01/01/2024   GLUCOSE 158 (H) 01/01/2024   BUN 18 01/01/2024   CREATININE 0.80 01/01/2024   BILITOT 0.4 01/01/2024   ALKPHOS 68 06/18/2023   AST 20 01/01/2024   ALT 21 01/01/2024   PROT 6.1 01/01/2024   ALBUMIN 4.0 06/18/2023   CALCIUM  9.6 01/01/2024   GFRAA 85 12/12/2020   QFTBGOLD NEGATIVE 05/05/2017   QFTBGOLDPLUS NEGATIVE 07/12/2021    Speciality Comments: Prior therapy includes: Enbrel  (inadequate response), Plaquenil  (inadequate response)   PLQ Eye 11/01/2022 normal Jill Mathews   Visual Field scheduled 11/27/2022 f/u TBD  Procedures:  No procedures performed Allergies: Penicillins   Assessment / Plan:     Visit Diagnoses: Rheumatoid arthritis involving multiple sites with positive rheumatoid factor (HCC)  HLA B27 (HLA B27 positive)  High risk medication use  Chronic right shoulder pain  Pain in right elbow  Primary osteoarthritis of right knee  Pain in left foot  Tinea corporis  Myofascial pain  Other fatigue  Hypersomnolence  History of hyperlipidemia  History of hypertension  History of hypothyroidism  Prediabetes  OSA (obstructive sleep apnea)  Orders: No orders of the defined types were placed in this encounter.  No orders of the defined types were placed in this encounter.   Face-to-face time spent with patient was *** minutes. Greater than 50% of time was spent in counseling and  coordination of care.  Follow-Up Instructions: No follow-ups on file.   Waddell CHRISTELLA Craze, PA-C  Note - This record has been created using Dragon software.  Chart creation errors have been sought, but may not always  have been located. Such creation errors do not reflect on  the standard of medical care.

## 2024-05-19 ENCOUNTER — Ambulatory Visit: Admitting: Physician Assistant

## 2024-05-19 DIAGNOSIS — M1711 Unilateral primary osteoarthritis, right knee: Secondary | ICD-10-CM

## 2024-05-19 DIAGNOSIS — M25521 Pain in right elbow: Secondary | ICD-10-CM

## 2024-05-19 DIAGNOSIS — M79672 Pain in left foot: Secondary | ICD-10-CM

## 2024-05-19 DIAGNOSIS — B354 Tinea corporis: Secondary | ICD-10-CM

## 2024-05-19 DIAGNOSIS — R7303 Prediabetes: Secondary | ICD-10-CM

## 2024-05-19 DIAGNOSIS — G4710 Hypersomnia, unspecified: Secondary | ICD-10-CM

## 2024-05-19 DIAGNOSIS — M7918 Myalgia, other site: Secondary | ICD-10-CM

## 2024-05-19 DIAGNOSIS — Z1589 Genetic susceptibility to other disease: Secondary | ICD-10-CM

## 2024-05-19 DIAGNOSIS — Z8639 Personal history of other endocrine, nutritional and metabolic disease: Secondary | ICD-10-CM

## 2024-05-19 DIAGNOSIS — G8929 Other chronic pain: Secondary | ICD-10-CM

## 2024-05-19 DIAGNOSIS — Z79899 Other long term (current) drug therapy: Secondary | ICD-10-CM

## 2024-05-19 DIAGNOSIS — M0579 Rheumatoid arthritis with rheumatoid factor of multiple sites without organ or systems involvement: Secondary | ICD-10-CM

## 2024-05-19 DIAGNOSIS — R5383 Other fatigue: Secondary | ICD-10-CM

## 2024-05-19 DIAGNOSIS — G4733 Obstructive sleep apnea (adult) (pediatric): Secondary | ICD-10-CM

## 2024-05-19 DIAGNOSIS — Z8679 Personal history of other diseases of the circulatory system: Secondary | ICD-10-CM

## 2024-07-02 ENCOUNTER — Other Ambulatory Visit: Payer: Self-pay | Admitting: Family Medicine

## 2024-07-02 DIAGNOSIS — E039 Hypothyroidism, unspecified: Secondary | ICD-10-CM

## 2024-07-02 DIAGNOSIS — I10 Essential (primary) hypertension: Secondary | ICD-10-CM

## 2024-07-02 NOTE — Telephone Encounter (Signed)
 Advise pt that I sent in another 90d. But her last thyroid labs was a year ago.  She needs to find a PCP there, as she is due for bloodwork (and physical)

## 2024-07-05 ENCOUNTER — Other Ambulatory Visit: Payer: Self-pay | Admitting: Family Medicine

## 2024-07-05 DIAGNOSIS — M797 Fibromyalgia: Secondary | ICD-10-CM

## 2024-07-14 ENCOUNTER — Other Ambulatory Visit: Payer: Self-pay | Admitting: *Deleted

## 2024-07-14 MED ORDER — HYDROXYCHLOROQUINE SULFATE 200 MG PO TABS: 200.0000 mg | ORAL_TABLET | Freq: Two times a day (BID) | ORAL | 0 refills | Status: AC

## 2024-07-14 MED ORDER — PREDNISONE 5 MG PO TABS: ORAL_TABLET | ORAL | 0 refills | Status: AC

## 2024-07-14 NOTE — Telephone Encounter (Signed)
 We cannot refill Plaquenil  until he have an eye examination to screen for hydroxychloroquine  toxicity.  She will have to see an ophthalmologist.  It is okay to send a prednisone  taper starting at 20 mg and taper by 5 mg every 2 days.  In the future she will have to get a prednisone  taper from her PCP locally.

## 2024-07-14 NOTE — Telephone Encounter (Signed)
 Patient called, patient is having a flare x 6 weeks and requesting prednisone , pain in 2nd toes R and L, Bil wrist pain, patient has moved to [place] and hasn't had labs or transferred care, patient advised if there is a [facility], [facility] or any other lab near her where we can fax labs and let us  know where we can transfer her records when she finds a Rheumatologist.  Last Fill: 10/06/2023  Eye exam: 11/01/2022 patient advised she needs eye exam   Labs: 01/01/2024 CBC and CMP normal except glucose is elevated at 158. Please notify patient and forward results to PCP.   Next Visit: patient has moved to [place] and has not transferred care  Last Visit: 01/01/2024  DX: Rheumatoid arthritis involving multiple sites with positive rheumatoid factor   Current Dose per office note 01/01/2024: hydroxychloroquine  200 mg p.o. twice daily   Okay to refill Plaquenil ?

## 2024-07-14 NOTE — Telephone Encounter (Signed)
 Plaquenil  was approved. Would you like for us  to call and cancel it?
